# Patient Record
Sex: Female | Born: 1955 | Race: White | Hispanic: No | Marital: Married | Smoking: Never smoker
Health system: Southern US, Community
[De-identification: ages and names within clinical notes are randomized; demographics above are authoritative.]

## PROBLEM LIST (undated history)

## (undated) DIAGNOSIS — F329 Major depressive disorder, single episode, unspecified: Secondary | ICD-10-CM

## (undated) DIAGNOSIS — C801 Malignant (primary) neoplasm, unspecified: Secondary | ICD-10-CM

## (undated) DIAGNOSIS — Z923 Personal history of irradiation: Secondary | ICD-10-CM

## (undated) DIAGNOSIS — F419 Anxiety disorder, unspecified: Secondary | ICD-10-CM

## (undated) DIAGNOSIS — J189 Pneumonia, unspecified organism: Secondary | ICD-10-CM

## (undated) DIAGNOSIS — J019 Acute sinusitis, unspecified: Secondary | ICD-10-CM

## (undated) DIAGNOSIS — Z8042 Family history of malignant neoplasm of prostate: Secondary | ICD-10-CM

## (undated) DIAGNOSIS — I10 Essential (primary) hypertension: Secondary | ICD-10-CM

## (undated) DIAGNOSIS — F32A Depression, unspecified: Secondary | ICD-10-CM

## (undated) DIAGNOSIS — E669 Obesity, unspecified: Secondary | ICD-10-CM

## (undated) HISTORY — DX: Obesity, unspecified: E66.9

## (undated) HISTORY — DX: Essential (primary) hypertension: I10

## (undated) HISTORY — DX: Acute sinusitis, unspecified: J01.90

## (undated) HISTORY — DX: Family history of malignant neoplasm of prostate: Z80.42

## (undated) HISTORY — DX: Major depressive disorder, single episode, unspecified: F32.9

## (undated) HISTORY — PX: WISDOM TOOTH EXTRACTION: SHX21

## (undated) HISTORY — DX: Depression, unspecified: F32.A

## (undated) HISTORY — DX: Pneumonia, unspecified organism: J18.9

---

## 2000-07-28 ENCOUNTER — Other Ambulatory Visit: Admission: RE | Admit: 2000-07-28 | Discharge: 2000-07-28 | Payer: Self-pay | Admitting: Internal Medicine

## 2004-03-15 ENCOUNTER — Ambulatory Visit: Payer: Self-pay | Admitting: Internal Medicine

## 2004-07-30 ENCOUNTER — Ambulatory Visit: Payer: Self-pay | Admitting: Internal Medicine

## 2004-07-30 ENCOUNTER — Other Ambulatory Visit: Admission: RE | Admit: 2004-07-30 | Discharge: 2004-07-30 | Payer: Self-pay | Admitting: Internal Medicine

## 2004-08-04 ENCOUNTER — Ambulatory Visit: Payer: Self-pay | Admitting: Cardiology

## 2005-12-11 IMAGING — CT CT HEAD W/O CM
1 of 2 series · 16 of 30 positions shown, 20 images · non-contrast
Comparison: none

HISTORY: Tinnitus, ringing in ears, question acoustic neuroma

[Series 2: head_seq 4.5 h40s st · axial · 0.43mm/px · z∈[-129,-21]mm · 16 of 28 slices shown, 20 images]
[im 2/28  brain]
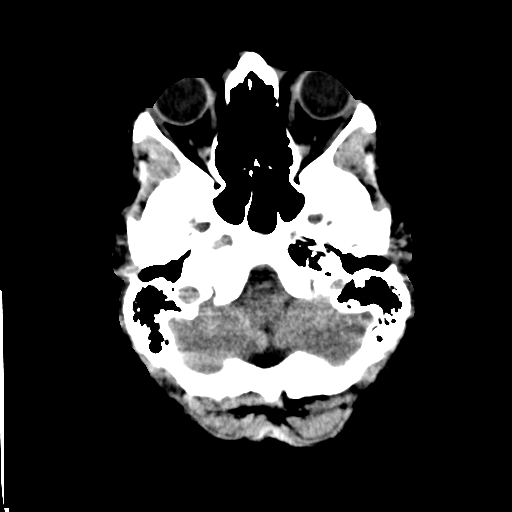
[im 2/28  bone]
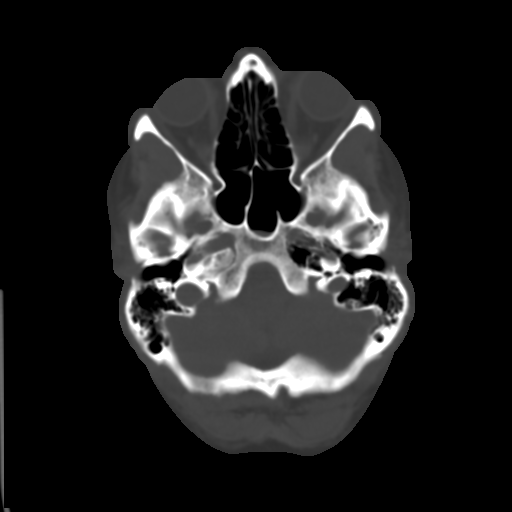
[im 4/28  brain]
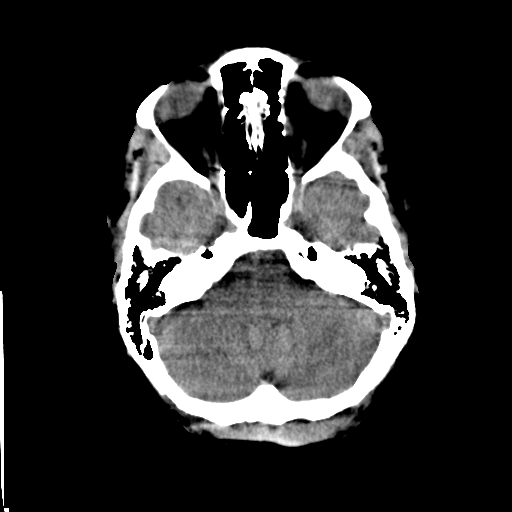
[im 5/28  brain]
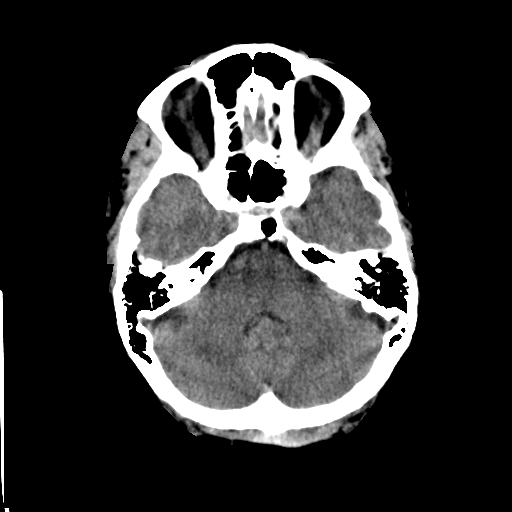
[im 7/28  brain]
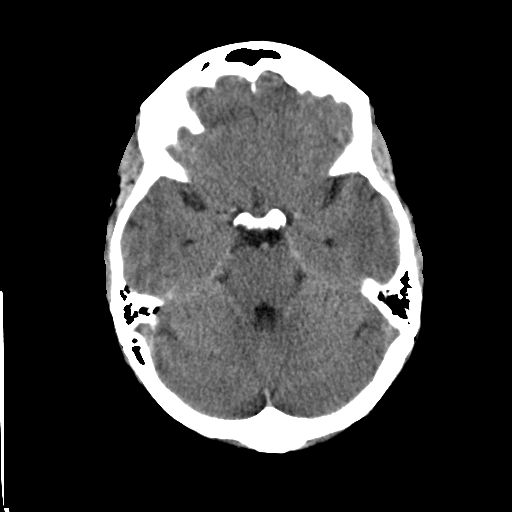
[im 8/28  brain]
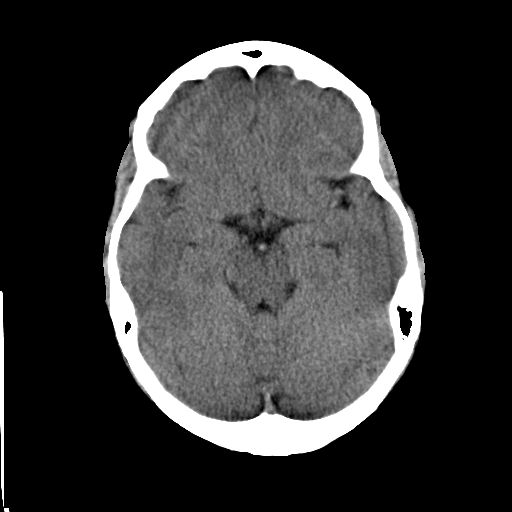
[im 8/28  bone]
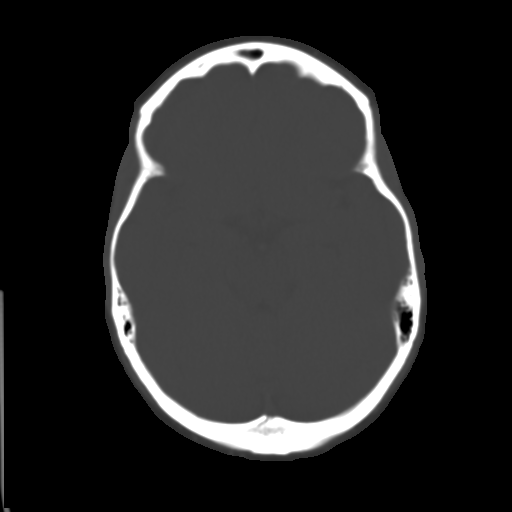
[im 10/28  brain]
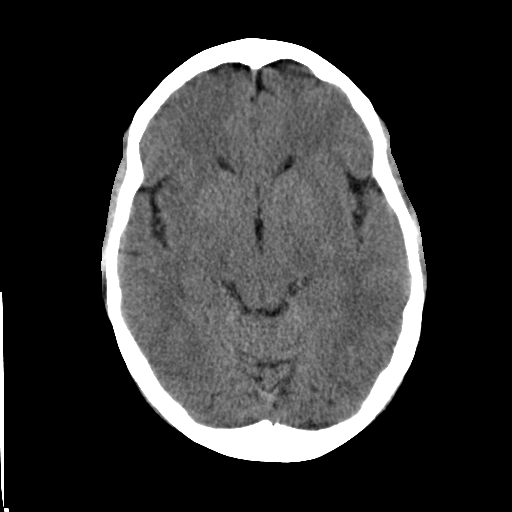
[im 11/28  brain]
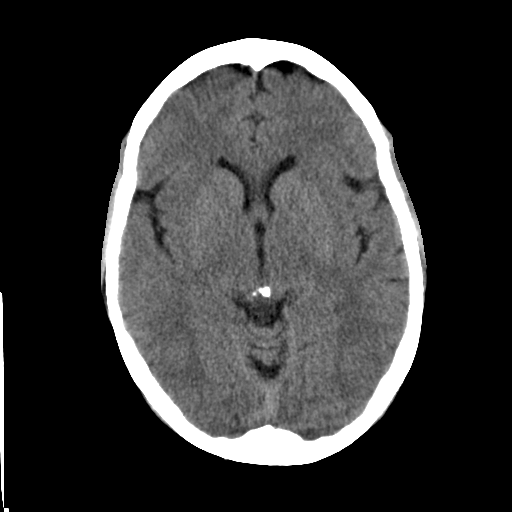
[im 13/28  brain]
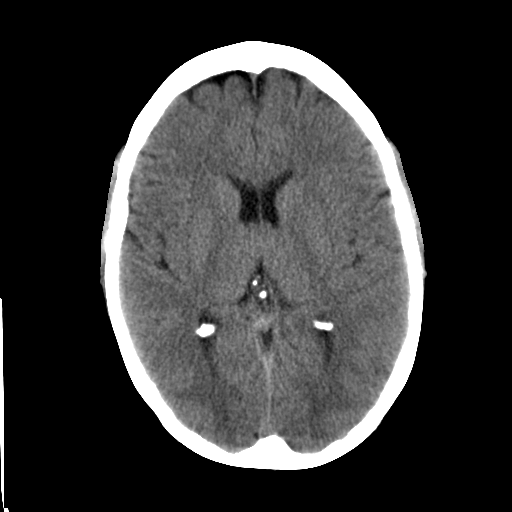
[im 15/28  brain]
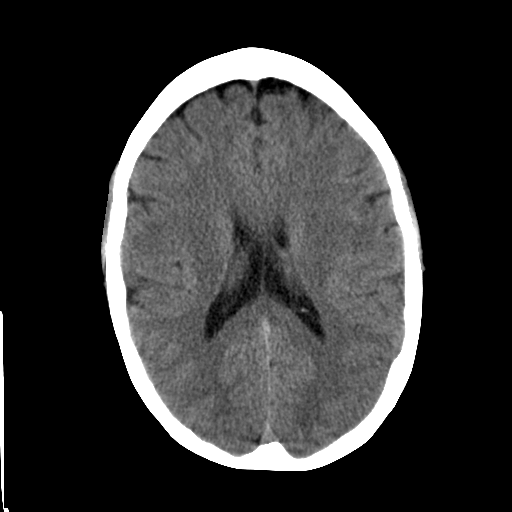
[im 15/28  bone]
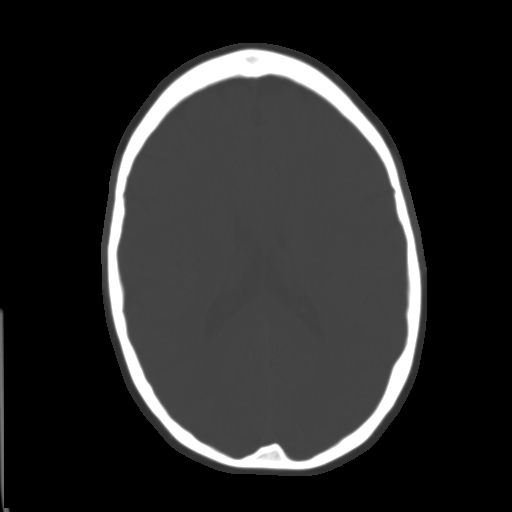
[im 17/28  brain]
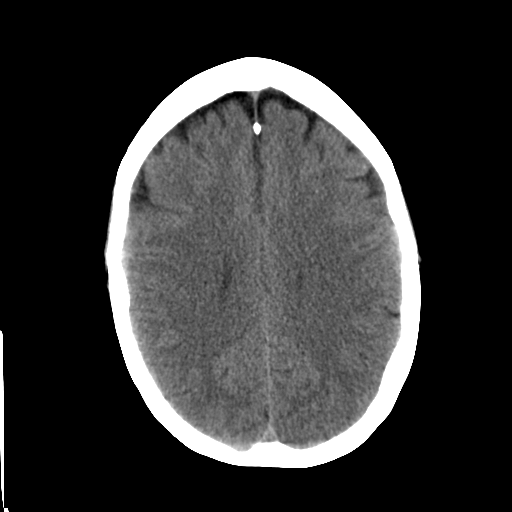
[im 18/28  brain]
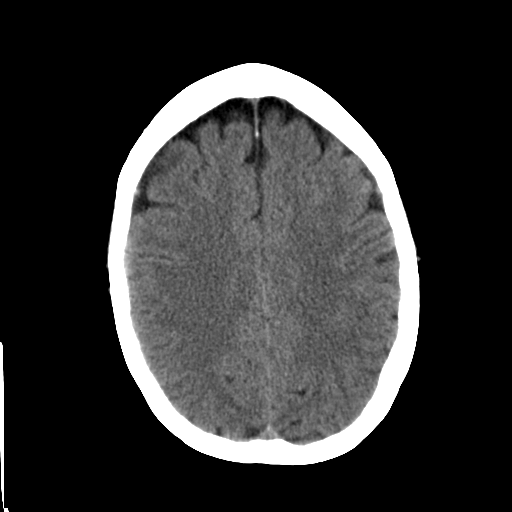
[im 20/28  brain]
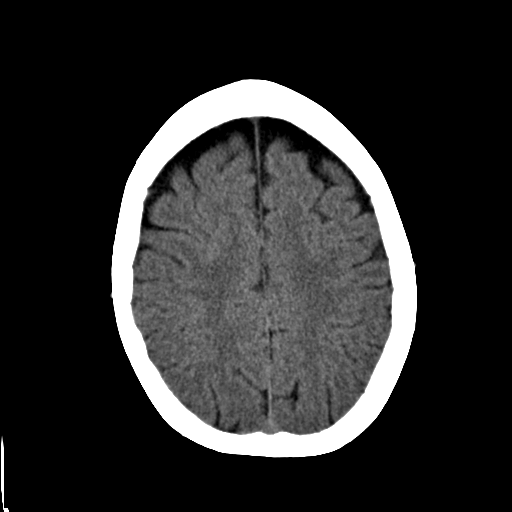
[im 21/28  brain]
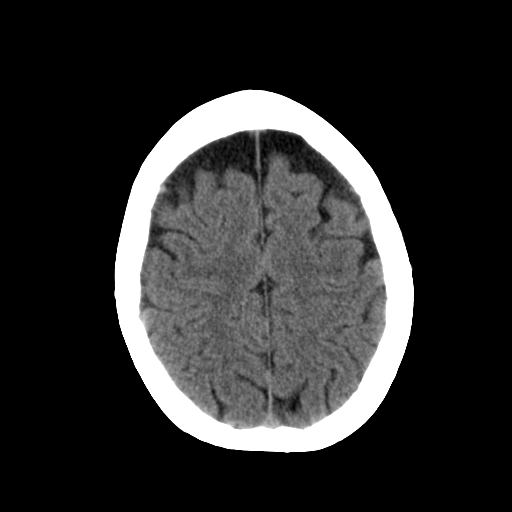
[im 21/28  bone]
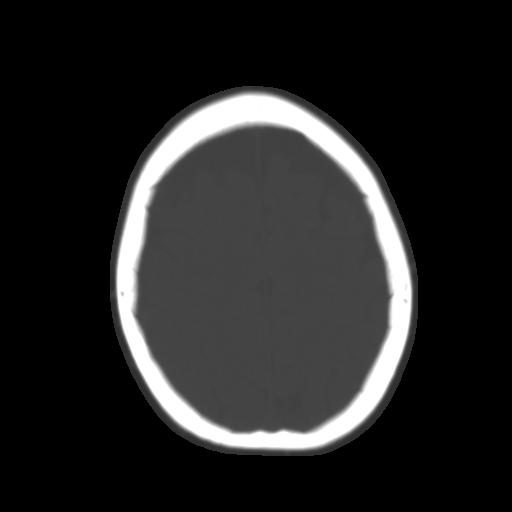
[im 23/28  brain]
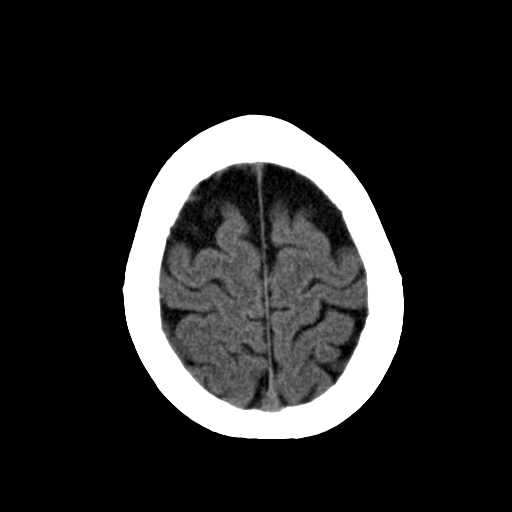
[im 24/28  brain]
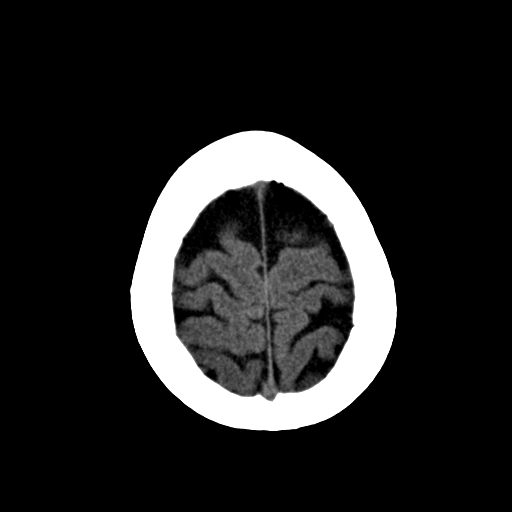
[im 26/28  brain]
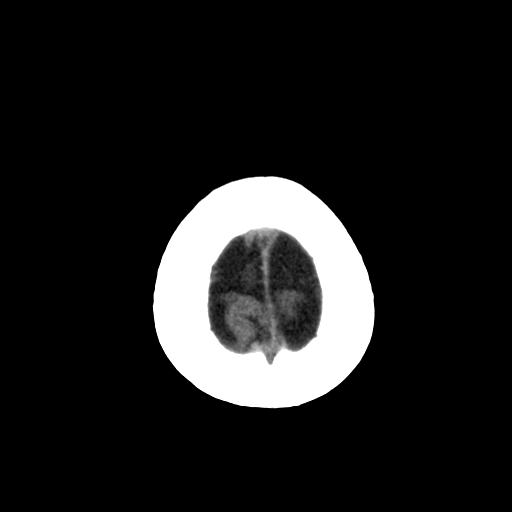

[16 of 30 positions shown; findings below may reference images not displayed]

CT HEAD WITHOUT CONTRAST:

Routine noncontrast CT without priors for comparison.

Scattered beam hardening artifacts at skullbase.
Normal ventricular morphology.
No midline shift or mass-effect.
Normal appearance of brain parenchyma.
No mass, hemorrhage, or infarct.
No definite cerebellopontine angle mass seen although visualization of this area
is limited by beam hardening artifacts.
Remainder of posterior fossa normal.
Sinuses clear and bones unremarkable.
IMPRESSION: No acute intracranial abnormalities.
Better detection of acoustic neuroma may be achieved with MRI of the IACs with
and without contrast.

## 2006-03-07 ENCOUNTER — Ambulatory Visit: Payer: Self-pay | Admitting: Internal Medicine

## 2007-11-09 ENCOUNTER — Telehealth (INDEPENDENT_AMBULATORY_CARE_PROVIDER_SITE_OTHER): Payer: Self-pay | Admitting: *Deleted

## 2007-11-10 ENCOUNTER — Ambulatory Visit: Payer: Self-pay | Admitting: Family Medicine

## 2008-06-10 ENCOUNTER — Telehealth (INDEPENDENT_AMBULATORY_CARE_PROVIDER_SITE_OTHER): Payer: Self-pay | Admitting: *Deleted

## 2008-07-25 ENCOUNTER — Telehealth: Payer: Self-pay | Admitting: Internal Medicine

## 2008-08-12 ENCOUNTER — Telehealth: Payer: Self-pay | Admitting: Internal Medicine

## 2008-12-29 ENCOUNTER — Ambulatory Visit: Payer: Self-pay | Admitting: Internal Medicine

## 2008-12-29 LAB — CONVERTED CEMR LAB
ALT: 22 units/L (ref 0–35)
AST: 21 units/L (ref 0–37)
Alkaline Phosphatase: 46 units/L (ref 39–117)
Basophils Absolute: 0.1 10*3/uL (ref 0.0–0.1)
CO2: 28 meq/L (ref 19–32)
Chloride: 103 meq/L (ref 96–112)
Direct LDL: 144.3 mg/dL
Glucose, Bld: 100 mg/dL — ABNORMAL HIGH (ref 70–99)
HCT: 40.4 % (ref 36.0–46.0)
Ketones, ur: NEGATIVE mg/dL
Leukocytes, UA: NEGATIVE
Lymphocytes Relative: 26.4 % (ref 12.0–46.0)
MCV: 92.2 fL (ref 78.0–100.0)
Monocytes Absolute: 0.6 10*3/uL (ref 0.1–1.0)
Monocytes Relative: 5.4 % (ref 3.0–12.0)
Neutrophils Relative %: 64.9 % (ref 43.0–77.0)
Nitrite: NEGATIVE
RBC: 4.39 M/uL (ref 3.87–5.11)
TSH: 1.81 microintl units/mL (ref 0.35–5.50)
Total CHOL/HDL Ratio: 4
Total Protein: 7.5 g/dL (ref 6.0–8.3)
Urobilinogen, UA: 0.2 (ref 0.0–1.0)
VLDL: 19 mg/dL (ref 0.0–40.0)
WBC: 10.7 10*3/uL — ABNORMAL HIGH (ref 4.5–10.5)

## 2009-01-05 ENCOUNTER — Other Ambulatory Visit: Admission: RE | Admit: 2009-01-05 | Discharge: 2009-01-05 | Payer: Self-pay | Admitting: Internal Medicine

## 2009-01-05 ENCOUNTER — Ambulatory Visit: Payer: Self-pay | Admitting: Internal Medicine

## 2009-01-05 LAB — HM PAP SMEAR

## 2009-01-08 DIAGNOSIS — J019 Acute sinusitis, unspecified: Secondary | ICD-10-CM | POA: Insufficient documentation

## 2009-01-08 DIAGNOSIS — I10 Essential (primary) hypertension: Secondary | ICD-10-CM | POA: Insufficient documentation

## 2009-01-08 DIAGNOSIS — E669 Obesity, unspecified: Secondary | ICD-10-CM

## 2009-01-08 DIAGNOSIS — F320 Major depressive disorder, single episode, mild: Secondary | ICD-10-CM

## 2009-01-08 DIAGNOSIS — J189 Pneumonia, unspecified organism: Secondary | ICD-10-CM | POA: Insufficient documentation

## 2009-01-22 ENCOUNTER — Encounter: Payer: Self-pay | Admitting: Internal Medicine

## 2009-04-08 ENCOUNTER — Encounter (INDEPENDENT_AMBULATORY_CARE_PROVIDER_SITE_OTHER): Payer: Self-pay | Admitting: *Deleted

## 2010-02-28 ENCOUNTER — Encounter: Payer: Self-pay | Admitting: Internal Medicine

## 2010-03-09 NOTE — Letter (Signed)
Summary: Referral - not able to see patient  [facility]  [address] [place], [place] [address]   Phone: [phone]  Fax: [phone]    April 08, 2009   Dr. Illene Regulus, M.D. [address] [place], [place] [address]    Re:   Shelby Payne DOB:  [birth year] MRN:   [id number]    Dear Dr. Debby Bud:  Thank you for your kind referral of the above patient.  We have attempted to schedule the recommended procedure Screening Colonoscopy but have not been able to schedule because:   X  The patient was not available by phone and/or has not returned our calls.  ___ The patient declined to schedule the procedure at this time.  We appreciate the referral and hope that we will have the opportunity to treat this patient in the future.    Sincerely,    [facility] [phone]

## 2010-06-17 ENCOUNTER — Other Ambulatory Visit: Payer: Self-pay | Admitting: Internal Medicine

## 2010-07-16 ENCOUNTER — Other Ambulatory Visit: Payer: Self-pay | Admitting: Internal Medicine

## 2010-11-16 ENCOUNTER — Other Ambulatory Visit: Payer: Self-pay | Admitting: Internal Medicine

## 2010-12-13 ENCOUNTER — Other Ambulatory Visit: Payer: Self-pay | Admitting: Internal Medicine

## 2011-01-10 ENCOUNTER — Other Ambulatory Visit: Payer: Self-pay | Admitting: *Deleted

## 2011-01-10 ENCOUNTER — Other Ambulatory Visit: Payer: Self-pay | Admitting: Internal Medicine

## 2011-01-10 MED ORDER — SERTRALINE HCL 100 MG PO TABS: 100.0000 mg | ORAL_TABLET | Freq: Every day | ORAL | Status: DC

## 2011-01-12 ENCOUNTER — Other Ambulatory Visit: Payer: Self-pay | Admitting: Internal Medicine

## 2011-01-25 ENCOUNTER — Encounter: Payer: Self-pay | Admitting: Internal Medicine

## 2011-01-25 ENCOUNTER — Ambulatory Visit (INDEPENDENT_AMBULATORY_CARE_PROVIDER_SITE_OTHER): Payer: BC Managed Care – PPO | Admitting: Internal Medicine

## 2011-01-25 VITALS — BP 160/90 | HR 82 | Temp 99.9°F | Wt 233.0 lb

## 2011-01-25 DIAGNOSIS — E669 Obesity, unspecified: Secondary | ICD-10-CM

## 2011-01-25 DIAGNOSIS — E785 Hyperlipidemia, unspecified: Secondary | ICD-10-CM

## 2011-01-25 DIAGNOSIS — Z8659 Personal history of other mental and behavioral disorders: Secondary | ICD-10-CM

## 2011-01-25 DIAGNOSIS — Z0000 Encounter for general adult medical examination without abnormal findings: Secondary | ICD-10-CM

## 2011-01-25 DIAGNOSIS — I10 Essential (primary) hypertension: Secondary | ICD-10-CM

## 2011-01-25 DIAGNOSIS — Z1211 Encounter for screening for malignant neoplasm of colon: Secondary | ICD-10-CM

## 2011-01-25 MED ORDER — ENALAPRIL MALEATE 2.5 MG PO TABS: 5.0000 mg | ORAL_TABLET | Freq: Every day | ORAL | Status: DC

## 2011-01-25 MED ORDER — SERTRALINE HCL 100 MG PO TABS: 100.0000 mg | ORAL_TABLET | Freq: Every day | ORAL | Status: DC

## 2011-01-25 MED ORDER — METOPROLOL SUCCINATE ER 100 MG PO TB24: 100.0000 mg | ORAL_TABLET | Freq: Every day | ORAL | Status: DC

## 2011-01-25 MED ORDER — HYDROCHLOROTHIAZIDE 25 MG PO TABS: 25.0000 mg | ORAL_TABLET | Freq: Every day | ORAL | Status: DC

## 2011-01-25 NOTE — Patient Instructions (Signed)
Blood pressure is running a little high. No signs of damage. Plan - continue metoprolol and HCTZ, add enalapril 5 mg once day, keep a record at home and report back, come by for lab in one month.  See you in the Spring of '13 for a complete exam with PAP.  Please schedule a mammogram at [facility].  Call for problems.

## 2011-01-25 NOTE — Progress Notes (Signed)
Subjective:    Patient ID: Shelby Payne, female    DOB: [birth year], 55 y.o.   MRN: [id number]  HPI Mrs. Chern presents for follow-up and treatment of hypertenion. Last visit in '10. Interval history is unremarkable for major illness, no surgery, no injuries. She is due for PAP in 2013, overdue for mammogram - never had, never had colonoscopy and is due. She is generally feeling well and doing well.  Past Medical History  Diagnosis Date  . Obesity, unspecified   . Depression   . Hypertension   . Acute sinusitis, unspecified   . Pneumonia, organism unspecified    Past Surgical History  Procedure Date  . Wisdom tooth extraction    Family History  Problem Relation Age of Onset  . Hypertension Mother   . Hypothyroidism Mother   . Hyperlipidemia Mother   . Gout Mother   . Heart disease Father   . Hypertension Father   . Diabetes Father   . Hypothyroidism Father   . Hypothyroidism Sister   . COPD Neg Hx   . Cancer Sister     angiosarcoma - mets   History   Social History  . Marital Status: Single    Spouse Name: N/A    Number of Children: N/A  . Years of Education: N/A   Occupational History  . Not on file.   Social History Main Topics  . Smoking status: Never Smoker   . Smokeless tobacco: Never Used  . Alcohol Use: Yes     frare glass of wine  . Drug Use: No  . Sexually Active: Yes -- Female partner(s)   Other Topics Concern  . Not on file   Social History Narrative   HSG, [employer], Master's degree in speech pathology, Master physiology. Married [id number] son 42; 1 daughter 44 - at [facility] (12/12). SO - working Pensions consultant, Suffers from [employer] disease. Marriage is in good  health   Current Outpatient Prescriptions on File Prior to Visit  Medication Sig Dispense Refill  . sertraline (ZOLOFT) 100 MG tablet take 1 tablet by mouth once daily NEEDS OFFICE VISIT  30 tablet  0       Review of Systems Constitutional:  Negative for fever, chills, activity  change and unexpected weight change.  HEENT:  Negative for hearing loss, ear pain, congestion, neck stiffness and postnasal drip. Negative for sore throat or swallowing problems. Negative for dental complaints.   Eyes: Negative for vision loss or change in visual acuity.  Respiratory: Negative for chest tightness and wheezing. Negative for DOE.   Cardiovascular: Negative for chest pain or palpitations. No decreased exercise tolerance Gastrointestinal: No change in bowel habit. No bloating or gas. No reflux or indigestion Genitourinary: Negative for urgency, frequency, flank pain and difficulty urinating.  Musculoskeletal: Negative for myalgias, back pain, arthralgias and gait problem.  Neurological: Negative for dizziness, tremors, weakness and headaches.  Hematological: Negative for adenopathy.  Psychiatric/Behavioral: Negative for behavioral problems and dysphoric mood.       Objective:   Physical Exam Vitals reviewed- 160/90, BMI 39.9 Gen'l- overweight white woman in no distress HEENT - C&S clear, TMs normal  Neck- supple, no thyromegaly Breast exam - deferred Pulm- normal respirations Cor - 2+ radial, RRR Neuro - A&O x 3  Lab Results  Component Value Date   WBC 10.7* 12/29/2008   HGB 13.8 12/29/2008   HCT 40.4 12/29/2008   PLT 225.0 12/29/2008   GLUCOSE 100* 12/29/2008   CHOL 204* 12/29/2008   TRIG  95.0 12/29/2008   HDL 51.80 12/29/2008   LDLDIRECT 144.3 12/29/2008   ALT 22 12/29/2008   AST 21 12/29/2008   NA 138 12/29/2008   K 3.9 12/29/2008   CL 103 12/29/2008   CREATININE 0.7 12/29/2008   BUN 12 12/29/2008   CO2 28 12/29/2008   TSH 1.81 12/29/2008        Assessment & Plan:

## 2011-01-27 ENCOUNTER — Encounter: Payer: Self-pay | Admitting: Internal Medicine

## 2011-01-27 DIAGNOSIS — Z0000 Encounter for general adult medical examination without abnormal findings: Secondary | ICD-10-CM | POA: Insufficient documentation

## 2011-01-27 DIAGNOSIS — E785 Hyperlipidemia, unspecified: Secondary | ICD-10-CM | POA: Insufficient documentation

## 2011-01-27 NOTE — Assessment & Plan Note (Signed)
BP Readings from Last 3 Encounters:  01/25/11 160/90  01/05/09 162/96  11/10/07 132/80   Trend is upward and control is suboptimal  Plan continue HCTZ and metoprolol         Add Enalapril 5 mg daily         Follow-up lab in 1 month         Recheck BP in one month

## 2011-01-27 NOTE — Assessment & Plan Note (Signed)
Stable and doing well on zoloft w/o adverse effects.  Plan - medication renewed.

## 2011-01-27 NOTE — Assessment & Plan Note (Signed)
Encourage weight management.  Plan - smart food choices, PORTION SIZE CONTROL - palm of the hand as guide, regular exercise.

## 2011-01-27 NOTE — Assessment & Plan Note (Signed)
Last lipid panel in '10 - mildly elevated LDL - not at the point where medical therapy is needed.  Plan - life style management - low fat diet           Repeat lab in 1 month

## 2011-01-27 NOTE — Assessment & Plan Note (Signed)
Interval history is ok. She is due for complete physical w/ PAP, last exam '10. She is due for mammography and she will schedule her own appointment at [facility]. She is due for colonoscopy and request is in for study in July '13

## 2011-03-11 ENCOUNTER — Other Ambulatory Visit (INDEPENDENT_AMBULATORY_CARE_PROVIDER_SITE_OTHER): Payer: BC Managed Care – PPO

## 2011-03-11 DIAGNOSIS — I10 Essential (primary) hypertension: Secondary | ICD-10-CM

## 2011-03-11 DIAGNOSIS — E785 Hyperlipidemia, unspecified: Secondary | ICD-10-CM

## 2011-03-11 LAB — COMPREHENSIVE METABOLIC PANEL
Alkaline Phosphatase: 44 U/L (ref 39–117)
BUN: 15 mg/dL (ref 6–23)
Creatinine, Ser: 0.7 mg/dL (ref 0.4–1.2)
GFR: 94.07 mL/min (ref >60.00)
Glucose, Bld: 87 mg/dL (ref 70–99)
Sodium: 135 mEq/L (ref 135–145)
Total Bilirubin: 0.5 mg/dL (ref 0.3–1.2)
Total Protein: 8 g/dL (ref 6.0–8.3)

## 2011-03-11 LAB — LIPID PANEL
Cholesterol: 190 mg/dL (ref 0–200)
HDL: 51.1 mg/dL (ref >39.00)
LDL Cholesterol: 118 mg/dL — ABNORMAL HIGH (ref 0–99)
Triglycerides: 107 mg/dL (ref 0.0–149.0)
VLDL: 21.4 mg/dL (ref 0.0–40.0)

## 2011-03-20 ENCOUNTER — Encounter: Payer: Self-pay | Admitting: Internal Medicine

## 2011-03-25 ENCOUNTER — Other Ambulatory Visit: Payer: Self-pay | Admitting: Internal Medicine

## 2011-04-12 ENCOUNTER — Encounter: Payer: Self-pay | Admitting: Internal Medicine

## 2011-04-12 ENCOUNTER — Ambulatory Visit (INDEPENDENT_AMBULATORY_CARE_PROVIDER_SITE_OTHER): Payer: BC Managed Care – PPO | Admitting: Internal Medicine

## 2011-04-12 VITALS — BP 130/80 | HR 84 | Temp 98.6°F | Resp 16 | Wt 231.5 lb

## 2011-04-12 DIAGNOSIS — R059 Cough, unspecified: Secondary | ICD-10-CM

## 2011-04-12 DIAGNOSIS — R05 Cough: Secondary | ICD-10-CM

## 2011-04-12 MED ORDER — BENZONATATE 100 MG PO CAPS: 100.0000 mg | ORAL_CAPSULE | Freq: Three times a day (TID) | ORAL | Status: AC

## 2011-04-12 MED ORDER — AZITHROMYCIN 250 MG PO TABS: ORAL_TABLET | ORAL | Status: AC

## 2011-04-12 MED ORDER — PROMETHAZINE-CODEINE 6.25-10 MG/5ML PO SYRP: 5.0000 mL | ORAL_SOLUTION | ORAL | Status: AC | PRN

## 2011-04-12 NOTE — Patient Instructions (Signed)
Persistent cough - viral infection with cough, no indication of a bacterial infection. I am concerned for the possibility of adult pertussis, unlikely but possible. Plan - codeine cough syrup 1 tsp every 4-5 hrs during the day; 2 tsp at bedtime           Tessalon perles 100 mg three times a day           Azithromycin as directed- an antibiotic that is specifically the treatment for pertussis.  Blood pressure - GREAT control.   Cough, Adult  A cough is a reflex that helps clear your throat and airways. It can help heal the body or may be a reaction to an irritated airway. A cough may only last 2 or 3 weeks (acute) or may last more than 8 weeks (chronic).   CAUSES Acute cough:  Viral or bacterial infections.  Chronic cough:  Infections.   Allergies.   Asthma.   Post-nasal drip.   Smoking.   Heartburn or acid reflux.   Some medicines.   Chronic lung problems (COPD).   Cancer.  SYMPTOMS    Cough.   Fever.   Chest pain.   Increased breathing rate.   High-pitched whistling sound when breathing (wheezing).   Colored mucus that you cough up (sputum).  TREATMENT    A bacterial cough may be treated with antibiotic medicine.   A viral cough must run its course and will not respond to antibiotics.   Your caregiver may recommend other treatments if you have a chronic cough.  HOME CARE INSTRUCTIONS    Only take over-the-counter or prescription medicines for pain, discomfort, or fever as directed by your caregiver. Use cough suppressants only as directed by your caregiver.   Use a cold steam vaporizer or humidifier in your bedroom or home to help loosen secretions.   Sleep in a semi-upright position if your cough is worse at night.   Rest as needed.   Stop smoking if you smoke.  SEEK IMMEDIATE MEDICAL CARE IF:    You have pus in your sputum.   Your cough starts to worsen.   You cannot control your cough with suppressants and are losing sleep.   You begin  coughing up blood.   You have difficulty breathing.   You develop pain which is getting worse or is uncontrolled with medicine.   You have a fever.  MAKE SURE YOU:    Understand these instructions.   Will watch your condition.   Will get help right away if you are not doing well or get worse.  Document Released: 07/23/2010 Document Revised: 01/13/2011 Document Reviewed: 07/23/2010 [facility] Patient Information 2012 [place], [place].

## 2011-04-13 NOTE — Progress Notes (Signed)
  Subjective:    Patient ID: Shelby Payne, female    DOB: August 24, 1955, 56 y.o.   MRN: [id number]  HPI Shelby Payne presents with a "barky" cough for approximately 1 week. She hurts in her throat and chest from coughing. She denies any sputum production, fever, chills, SOB. With paroxysms of coughing she will gag but otherwise no N/V. She does have contact with children.  PMH, FamHx and SocHx reviewed for any changes and relevance.    Review of Systems System review is negative for any constitutional, cardiac, pulmonary, GI or neuro symptoms or complaints other than as described in the HPI.     Objective:   Physical Exam Filed Vitals:   04/12/11 1141  BP: 130/80  Pulse: 84  Temp: 98.6 F (37 C)  Resp: 16   Weight: 231 lb 8 oz (105.008 kg)  Gen'l- overweight white woman in no distress HEENT- TMs normal, throat clear Neck - supple Nodes - negative PUlm - good breath sounds, no rales or wheeze, no increased WOB Cor - RRR       Assessment & Plan:  Cough - viral URI vs possible pertussis  Plan - z-pak; prom/cod 1 tsp q 6; tessalon perles; supportive care.

## 2011-04-22 ENCOUNTER — Other Ambulatory Visit: Payer: Self-pay | Admitting: Internal Medicine

## 2011-05-29 ENCOUNTER — Other Ambulatory Visit: Payer: Self-pay | Admitting: Internal Medicine

## 2011-08-18 ENCOUNTER — Telehealth: Payer: Self-pay | Admitting: Internal Medicine

## 2011-08-18 DIAGNOSIS — Z1211 Encounter for screening for malignant neoplasm of colon: Secondary | ICD-10-CM

## 2011-08-18 NOTE — Telephone Encounter (Signed)
Referred patient for colonoscopy as discussed.

## 2011-08-19 ENCOUNTER — Encounter: Payer: Self-pay | Admitting: Internal Medicine

## 2011-08-22 ENCOUNTER — Ambulatory Visit (AMBULATORY_SURGERY_CENTER): Payer: BC Managed Care – PPO | Admitting: *Deleted

## 2011-08-22 ENCOUNTER — Encounter: Payer: Self-pay | Admitting: Internal Medicine

## 2011-08-22 VITALS — Ht 64.0 in | Wt 228.6 lb

## 2011-08-22 DIAGNOSIS — Z1211 Encounter for screening for malignant neoplasm of colon: Secondary | ICD-10-CM

## 2011-08-22 MED ORDER — MOVIPREP 100 G PO SOLR: 1.0000 | Freq: Once | ORAL | Status: DC

## 2011-08-31 ENCOUNTER — Encounter: Payer: Self-pay | Admitting: Internal Medicine

## 2011-08-31 ENCOUNTER — Ambulatory Visit (AMBULATORY_SURGERY_CENTER): Payer: BC Managed Care – PPO | Admitting: Internal Medicine

## 2011-08-31 VITALS — BP 147/95 | HR 76 | Temp 97.8°F | Resp 15 | Ht 64.0 in | Wt 228.0 lb

## 2011-08-31 DIAGNOSIS — Z1211 Encounter for screening for malignant neoplasm of colon: Secondary | ICD-10-CM

## 2011-08-31 DIAGNOSIS — K648 Other hemorrhoids: Secondary | ICD-10-CM

## 2011-08-31 MED ORDER — SODIUM CHLORIDE 0.9 % IV SOLN: 500.0000 mL | INTRAVENOUS | Status: DC

## 2011-08-31 NOTE — Patient Instructions (Addendum)
The only finding on your colonoscopy was internal hemorrhoids. These are not usually a medical problem. No polyps or cancer seen! Excellent prep!  Please read the handouts and I will see you in 10 years for another routine colonoscopy if that is still the recommendation in 2023.  Thank you for choosing [facility].  Iva Boop, MD, FACG  YOU HAD AN ENDOSCOPIC PROCEDURE TODAY AT THE [facility]: Refer to the procedure report that was given to you for any specific questions about what was found during the examination.  If the procedure report does not answer your questions, please call your gastroenterologist to clarify.  If you requested that your care partner not be given the details of your procedure findings, then the procedure report has been included in a sealed envelope for you to review at your convenience later.  YOU SHOULD EXPECT: Some feelings of bloating in the abdomen. Passage of more gas than usual.  Walking can help get rid of the air that was put into your GI tract during the procedure and reduce the bloating. If you had a lower endoscopy (such as a colonoscopy or flexible sigmoidoscopy) you may notice spotting of blood in your stool or on the toilet paper. If you underwent a bowel prep for your procedure, then you may not have a normal bowel movement for a few days.  DIET: Your first meal following the procedure should be a light meal and then it is ok to progress to your normal diet.  A half-sandwich or bowl of soup is an example of a good first meal.  Heavy or fried foods are harder to digest and may make you feel nauseous or bloated.  Likewise meals heavy in dairy and vegetables can cause extra gas to form and this can also increase the bloating.  Drink plenty of fluids but you should avoid alcoholic beverages for 24 hours.  ACTIVITY: Your care partner should take you home directly after the procedure.  You should plan to take it easy, moving slowly  for the rest of the day.  You can resume normal activity the day after the procedure however you should NOT DRIVE or use heavy machinery for 24 hours (because of the sedation medicines used during the test).    SYMPTOMS TO REPORT IMMEDIATELY: A gastroenterologist can be reached at any hour.  During normal business hours, 8:30 AM to 5:00 PM Monday through Friday, call [phone].  After hours and on weekends, please call the GI answering service at [phone] who will take a message and have the physician on call contact you.   Following lower endoscopy (colonoscopy or flexible sigmoidoscopy):  Excessive amounts of blood in the stool  Significant tenderness or worsening of abdominal pains  Swelling of the abdomen that is new, acute  Fever of 100F or higher   FOLLOW UP: Our staff will call the home number listed on your records the next business day following your procedure to check on you and address any questions or concerns that you may have at that time regarding the information given to you following your procedure. This is a courtesy call and so if there is no answer at the home number and we have not heard from you through the emergency physician on call, we will assume that you have returned to your regular daily activities without incident.  SIGNATURES/CONFIDENTIALITY: You and/or your care partner have signed paperwork which will be entered into your electronic medical record.  These signatures  attest to the fact that that the information above on your After Visit Summary has been reviewed and is understood.  Full responsibility of the confidentiality of this discharge information lies with you and/or your care-partner.   Ok to resume your normal medications  Follow up colonoscopy in 10 years

## 2011-08-31 NOTE — Op Note (Signed)
[facility] [address] [place], [place]  [address]  COLONOSCOPY PROCEDURE REPORT  PATIENT:  Shelby Payne, Shelby Payne  MR#:  [id number] BIRTHDATE:  [birth year], 55 yrs. old  GENDER:  female ENDOSCOPIST:  Iva Boop, MD, [facility] REF. BY:  Rosalyn Gess. Norins, M.D. PROCEDURE DATE:  08/31/2011 PROCEDURE:  Colonoscopy 19147 ASA CLASS:  Class II INDICATIONS:  Routine Risk Screening MEDICATIONS:   These medications were titrated to patient response per physician's verbal order, MAC sedation, administered by CRNA, propofol (Diprivan) 200 mg IV  DESCRIPTION OF PROCEDURE:   After the risks benefits and alternatives of the procedure were thoroughly explained, informed consent was obtained.  Digital rectal exam was performed and revealed no abnormalities.   The LB [id number] endoscope was introduced through the anus and advanced to the cecum, which was identified by both the appendix and ileocecal valve, without limitations.  The quality of the prep was excellent, using MoviPrep.  The instrument was then slowly withdrawn as the colon was fully examined. <<PROCEDUREIMAGES>>  FINDINGS:  A normal appearing cecum, ileocecal valve, and appendiceal orifice were identified. The ascending, hepatic flexure, transverse, splenic flexure, descending, sigmoid colon, and rectum appeared unremarkable.   Retroflexed views in the rectum revealed internal hemorrhoids.    The time to cecum = 2:34 minutes. The scope was withdrawn in 9:18 minutes from the cecum and the procedure completed. COMPLICATIONS:  None ENDOSCOPIC IMPRESSION: 1) Normal colon 2) Internal hemorrhoids in the rectum  REPEAT EXAM:  In 10 year(s) for routine screening colonoscopy. 2023  Iva Boop, MD, [facility]  CC:  Jacques Navy, MD and The Patient  n. Rosalie Doctor:   Iva Boop at 08/31/2011 08:50 AM  Alveta Heimlich, [phone]

## 2011-08-31 NOTE — Progress Notes (Signed)
Patient did not have preoperative order for IV antibiotic SSI prophylaxis. (G8918)  Patient did not experience any of the following events: a burn prior to discharge; a fall within the facility; wrong site/side/patient/procedure/implant event; or a hospital transfer or hospital admission upon discharge from the facility. (G8907)  

## 2011-09-01 ENCOUNTER — Telehealth: Payer: Self-pay | Admitting: *Deleted

## 2011-09-01 NOTE — Telephone Encounter (Signed)
Line busy on several attempts

## 2011-10-10 ENCOUNTER — Other Ambulatory Visit: Payer: Self-pay | Admitting: Internal Medicine

## 2011-11-18 ENCOUNTER — Other Ambulatory Visit: Payer: Self-pay | Admitting: Internal Medicine

## 2012-02-15 ENCOUNTER — Other Ambulatory Visit: Payer: Self-pay | Admitting: *Deleted

## 2012-02-15 ENCOUNTER — Other Ambulatory Visit: Payer: Self-pay | Admitting: Internal Medicine

## 2012-02-15 MED ORDER — METOPROLOL SUCCINATE ER 100 MG PO TB24: 100.0000 mg | ORAL_TABLET | Freq: Every day | ORAL | Status: DC

## 2012-02-15 MED ORDER — HYDROCHLOROTHIAZIDE 25 MG PO TABS: 25.0000 mg | ORAL_TABLET | Freq: Every day | ORAL | Status: DC

## 2012-02-15 NOTE — Telephone Encounter (Signed)
REFILLS ON MEDS SENT TO [facility]. PATIENT NEED TO MAKE APPT. WITH DR. Debby Bud FOR FURTHER REFILLS.

## 2012-03-19 ENCOUNTER — Other Ambulatory Visit: Payer: Self-pay | Admitting: Internal Medicine

## 2012-04-03 ENCOUNTER — Other Ambulatory Visit: Payer: Self-pay | Admitting: *Deleted

## 2012-04-03 MED ORDER — METOPROLOL SUCCINATE ER 100 MG PO TB24: 100.0000 mg | ORAL_TABLET | Freq: Every day | ORAL | Status: DC

## 2012-04-04 ENCOUNTER — Other Ambulatory Visit: Payer: Self-pay | Admitting: Internal Medicine

## 2012-05-11 ENCOUNTER — Telehealth: Payer: Self-pay | Admitting: Internal Medicine

## 2012-05-11 MED ORDER — HYDROCHLOROTHIAZIDE 25 MG PO TABS: 25.0000 mg | ORAL_TABLET | Freq: Every day | ORAL | Status: DC

## 2012-05-11 MED ORDER — METOPROLOL SUCCINATE ER 100 MG PO TB24: 100.0000 mg | ORAL_TABLET | Freq: Every day | ORAL | Status: DC

## 2012-05-11 MED ORDER — ENALAPRIL MALEATE 5 MG PO TABS: 5.0000 mg | ORAL_TABLET | Freq: Every day | ORAL | Status: DC

## 2012-05-11 NOTE — Telephone Encounter (Signed)
Rx's electronically sent to pharmacy as requested

## 2012-05-11 NOTE — Telephone Encounter (Signed)
Pt called req refill for Hydrochlorothiazide, metoprolol and enalapril to be call into [facility]. Pt had to move her CPE appt from 05/14/12 to 07/31/12. Please advise.

## 2012-05-14 ENCOUNTER — Encounter: Payer: BC Managed Care – PPO | Admitting: Internal Medicine

## 2012-07-31 ENCOUNTER — Ambulatory Visit (INDEPENDENT_AMBULATORY_CARE_PROVIDER_SITE_OTHER): Payer: BC Managed Care – PPO | Admitting: Internal Medicine

## 2012-07-31 ENCOUNTER — Encounter: Payer: Self-pay | Admitting: Internal Medicine

## 2012-07-31 ENCOUNTER — Other Ambulatory Visit (HOSPITAL_COMMUNITY)
Admission: RE | Admit: 2012-07-31 | Discharge: 2012-07-31 | Disposition: A | Discharge status: Home / Self Care | Payer: BC Managed Care – PPO | Source: Ambulatory Visit | Attending: Internal Medicine | Admitting: Internal Medicine

## 2012-07-31 ENCOUNTER — Other Ambulatory Visit (INDEPENDENT_AMBULATORY_CARE_PROVIDER_SITE_OTHER): Payer: BC Managed Care – PPO

## 2012-07-31 VITALS — BP 148/90 | HR 84 | Temp 98.1°F | Wt 236.0 lb

## 2012-07-31 DIAGNOSIS — F4329 Adjustment disorder with other symptoms: Secondary | ICD-10-CM

## 2012-07-31 DIAGNOSIS — Z124 Encounter for screening for malignant neoplasm of cervix: Secondary | ICD-10-CM

## 2012-07-31 DIAGNOSIS — E669 Obesity, unspecified: Secondary | ICD-10-CM

## 2012-07-31 DIAGNOSIS — I10 Essential (primary) hypertension: Secondary | ICD-10-CM

## 2012-07-31 DIAGNOSIS — Z01419 Encounter for gynecological examination (general) (routine) without abnormal findings: Secondary | ICD-10-CM | POA: Insufficient documentation

## 2012-07-31 DIAGNOSIS — E785 Hyperlipidemia, unspecified: Secondary | ICD-10-CM

## 2012-07-31 DIAGNOSIS — F4381 Prolonged grief disorder: Secondary | ICD-10-CM

## 2012-07-31 DIAGNOSIS — Z0000 Encounter for general adult medical examination without abnormal findings: Secondary | ICD-10-CM

## 2012-07-31 DIAGNOSIS — R202 Paresthesia of skin: Secondary | ICD-10-CM

## 2012-07-31 DIAGNOSIS — R209 Unspecified disturbances of skin sensation: Secondary | ICD-10-CM

## 2012-07-31 DIAGNOSIS — F4321 Adjustment disorder with depressed mood: Secondary | ICD-10-CM

## 2012-07-31 LAB — COMPREHENSIVE METABOLIC PANEL
BUN: 15 mg/dL (ref 6–23)
CO2: 26 mEq/L (ref 19–32)
Creatinine, Ser: 0.8 mg/dL (ref 0.4–1.2)
GFR: 85 mL/min (ref >60.00)
Glucose, Bld: 106 mg/dL — ABNORMAL HIGH (ref 70–99)
Total Bilirubin: 0.7 mg/dL (ref 0.3–1.2)

## 2012-07-31 LAB — HEPATIC FUNCTION PANEL
AST: 24 U/L (ref 0–37)
Alkaline Phosphatase: 41 U/L (ref 39–117)
Total Bilirubin: 0.7 mg/dL (ref 0.3–1.2)

## 2012-07-31 LAB — VITAMIN B12: Vitamin B-12: 746 pg/mL (ref 211–911)

## 2012-07-31 NOTE — Patient Instructions (Addendum)
Good to see you. Your exam is normal but I do want you to work on your weight.  Labs today and results will be available on MyChart  Diet management: smart food choices, PORTION SIZE CONTROL, regular exercise. Goal - to loose 1-2 lbs.month. Target weight - 200 lbs  Be well and take care of yourself. Return in 1 year or sooner as needed.

## 2012-07-31 NOTE — Assessment & Plan Note (Signed)
Lipid panel 1 yr ago: LDL 118, better than goal of 130 or less; HDL 51, better than goal of 40+  Plan Life-style management: low fat diet, exercise.  Repeat lipid panel '15

## 2012-07-31 NOTE — Assessment & Plan Note (Signed)
Loss of son 2012, loss of sister to cancer May '14, mother in declining health. Emotions in regard to these losses easily stirred. She has never had grief counseling.  Plan Strongly urged her to consider grief counseling. Should she decide to take this advise will schedule her with [facility]

## 2012-07-31 NOTE — Assessment & Plan Note (Signed)
Body mass index is 40.49 kg/(m^2). On-going problem with obesity.  Plan -Diet management: smart food choices, PORTION SIZE CONTROL, regular exercise. Goal - to loose 1-2 lbs.month. Target weight - 200 lbs

## 2012-07-31 NOTE — Assessment & Plan Note (Signed)
BP Readings from Last 3 Encounters:  07/31/12 148/90  08/31/11 147/95  04/12/11 130/80   Borderline control.  Plan Continue present medication  Monitor BP at home and report back readings via MyChart  Continue weight management, especially exercise.

## 2012-07-31 NOTE — Assessment & Plan Note (Signed)
Interval medical history is benign. See grief and loss above. Physical exam with pelvic/PAP and breast exam normal except for weight. Lab results are ok. She is current with colorectal cancer screening and had normal pelvic exam. Will schedule for mammogram. Immunization - up to date.  In summary - a nice woman who appears to be medically stable at this time. She will return as needed or in 1 year, 12 lbs lighter.

## 2012-07-31 NOTE — Progress Notes (Signed)
Subjective:    Patient ID: Shelby Payne, female    DOB: [birth year], 56 y.o.   MRN: [id number]  HPI Shelby Payne presents for a routine wellness exam. Work stress - felt forced out of her job. Her mom also had a serious illness - February/march. Younger sister died from angio-sarcoma after a 4 year struggle in the last month. In regard to her physical health she is doing OK. She does have plantar paresthesia with standing.   Last PAP 2010. Never has had a mammogram.   Past Medical History  Diagnosis Date  . Obesity, unspecified   . Depression   . Hypertension   . Acute sinusitis, unspecified   . Pneumonia, organism unspecified    Past Surgical History  Procedure Laterality Date  . Wisdom tooth extraction     Family History  Problem Relation Age of Onset  . Hypertension Mother   . Hypothyroidism Mother   . Hyperlipidemia Mother   . Gout Mother   . Heart disease Father   . Hypertension Father   . Diabetes Father   . Hypothyroidism Father   . Hypothyroidism Sister   . COPD Neg Hx   . Colon cancer Neg Hx   . Rectal cancer Neg Hx   . Stomach cancer Neg Hx   . Cancer Sister     angiosarcoma - mets  . Esophageal cancer Maternal Uncle    History   Social History  . Marital Status: Single    Spouse Name: N/A    Number of Children: N/A  . Years of Education: N/A   Occupational History  . Not on file.   Social History Main Topics  . Smoking status: Never Smoker   . Smokeless tobacco: Never Used  . Alcohol Use: Yes     Comment: rare glass of wine  . Drug Use: No  . Sexually Active: Yes -- Female partner(s)   Other Topics Concern  . Not on file   Social History Narrative   HSG, Shelby Payne, Master's degree in speech pathology, Master physiology. Married [id number] son [id number]; 1 daughter 1 - at [facility] (12/12). SO - working Pensions consultant, Suffers from [employer] disease. Marriage is in good  Health   Resigned from her job March 2014    Current Outpatient Prescriptions on File  Prior to Visit  Medication Sig Dispense Refill  . enalapril (VASOTEC) 5 MG tablet Take 1 tablet (5 mg total) by mouth daily.  30 tablet  2  . hydrochlorothiazide (HYDRODIURIL) 25 MG tablet Take 1 tablet (25 mg total) by mouth daily.  30 tablet  2  . metoprolol succinate (TOPROL-XL) 100 MG 24 hr tablet Take 1 tablet (100 mg total) by mouth daily.  30 tablet  2  . sertraline (ZOLOFT) 100 MG tablet take 1 tablet by mouth once daily  30 tablet  5   No current facility-administered medications on file prior to visit.      Review of Systems Constitutional:  Negative for fever, chills, activity change and unexpected weight change.  HEENT:  Negative for hearing loss, ear pain, congestion, neck stiffness and postnasal drip. Negative for sore throat or swallowing problems. Negative for dental complaints.   Eyes: Negative for vision loss or change in visual acuity.  Respiratory: Negative for chest tightness and wheezing. Negative for DOE.   Cardiovascular: Negative for chest pain or palpitations. No decreased exercise tolerance Gastrointestinal: No change in bowel habit. No bloating or gas. No reflux or indigestion Genitourinary: Negative for urgency, frequency,  flank pain and difficulty urinating.  Musculoskeletal: Negative for myalgias, back pain, arthralgias and gait problem.  Neurological: Negative for dizziness, tremors, weakness and headaches.  Hematological: Negative for adenopathy.  Psychiatric/Behavioral: Negative for behavioral problems and dysphoric mood.       Objective:   Physical Exam Filed Vitals:   07/31/12 1443  BP: 148/90  Pulse: 84  Temp: 98.1 F (36.7 C)   Wt Readings from Last 3 Encounters:  07/31/12 236 lb (107.049 kg)  08/31/11 228 lb (103.42 kg)  08/22/11 228 lb 9.6 oz (103.692 kg)   Gen'l: well nourished, well developed white Woman in no distress HEENT - [place]/AT, EACs/TMs normal, oropharynx with native dentition in good condition, no buccal or palatal lesions,  posterior pharynx clear, mucous membranes moist. C&S clear, PERRLA, fundi - normal Neck - supple, no thyromegaly Nodes- negative submental, cervical, supraclavicular regions Chest - no deformity, no CVAT Lungs - clear without rales, wheezes. No increased work of breathing Breast - - Skin normal, nipples w/o discharge, no fixed mass or lesion, no axillary adenopathy. Cardiovascular - regular rate and rhythm, quiet precordium, no murmurs, rubs or gallops, 2+ radial, DP and PT pulses Abdomen - BS+ x 4, no HSM, no guarding or rebound or tenderness Pelvic - NEG/BUS normal, normal vaginal mucosa, parous cervix, normal PAP scraping with endocervical brush, bimanual exam deferred to girth Rectal - deferred  Extremities - no clubbing, cyanosis, edema or deformity.  Neuro - A&O x 3, CN II-XII normal, motor strength normal and equal, DTRs 2+ and symmetrical biceps, radial, and patellar tendons. Cerebellar - no tremor, no rigidity, fluid movement and normal gait. Normal deep vibratory sensation right foot.  Derm - Head, neck, back, abdomen and extremities without suspicious lesions  Recent Results (from the past 2160 hour(s))  HEPATIC FUNCTION PANEL     Status: Abnormal   Collection Time    07/31/12  4:08 PM      Result Value Range   Total Bilirubin 0.7  0.3 - 1.2 mg/dL   Bilirubin, Direct 0.1  0.0 - 0.3 mg/dL   Alkaline Phosphatase 41  39 - 117 U/L   AST 24  0 - 37 U/L   ALT 27  0 - 35 U/L   Total Protein 8.4 (*) 6.0 - 8.3 g/dL   Albumin 4.4  3.5 - 5.2 g/dL  COMPREHENSIVE METABOLIC PANEL     Status: Abnormal   Collection Time    07/31/12  4:08 PM      Result Value Range   Sodium 136  135 - 145 mEq/L   Potassium 3.8  3.5 - 5.1 mEq/L   Chloride 97  96 - 112 mEq/L   CO2 26  19 - 32 mEq/L   Glucose, Bld 106 (*) 70 - 99 mg/dL   BUN 15  6 - 23 mg/dL   Creatinine, Ser 0.8  0.4 - 1.2 mg/dL   Total Bilirubin 0.7  0.3 - 1.2 mg/dL   Alkaline Phosphatase 41  39 - 117 U/L   AST 24  0 - 37 U/L   ALT  27  0 - 35 U/L   Total Protein 8.4 (*) 6.0 - 8.3 g/dL   Albumin 4.4  3.5 - 5.2 g/dL   Calcium 04.5  8.4 - 40.9 mg/dL   GFR 81.19  >14.78 mL/min  VITAMIN B12     Status: None   Collection Time    07/31/12  4:08 PM      Result Value Range  Vitamin B-12 746  211 - 911 pg/mL          Assessment & Plan:

## 2012-08-06 ENCOUNTER — Telehealth: Payer: Self-pay

## 2012-08-06 NOTE — Telephone Encounter (Signed)
Pt notified of pap results

## 2012-08-06 NOTE — Telephone Encounter (Signed)
Message copied by Noreene Larsson on Mon Aug 06, 2012  9:50 AM ------      Message from: Jacques Navy      Created: Sun Aug 05, 2012  9:57 PM       Please call patient - normal PAP ------

## 2012-08-14 ENCOUNTER — Other Ambulatory Visit: Payer: Self-pay | Admitting: Internal Medicine

## 2012-09-27 ENCOUNTER — Other Ambulatory Visit: Payer: Self-pay | Admitting: Internal Medicine

## 2013-02-12 ENCOUNTER — Other Ambulatory Visit: Payer: Self-pay | Admitting: Internal Medicine

## 2013-02-18 ENCOUNTER — Other Ambulatory Visit: Payer: Self-pay

## 2013-02-18 MED ORDER — HYDROCHLOROTHIAZIDE 25 MG PO TABS: 25.0000 mg | ORAL_TABLET | Freq: Every day | ORAL | Status: DC

## 2013-03-25 ENCOUNTER — Other Ambulatory Visit: Payer: Self-pay

## 2013-03-26 MED ORDER — SERTRALINE HCL 100 MG PO TABS: ORAL_TABLET | ORAL | Status: DC

## 2013-08-12 ENCOUNTER — Other Ambulatory Visit: Payer: Self-pay

## 2013-08-12 MED ORDER — HYDROCHLOROTHIAZIDE 25 MG PO TABS: 25.0000 mg | ORAL_TABLET | Freq: Every day | ORAL | Status: DC

## 2013-08-12 MED ORDER — ENALAPRIL MALEATE 5 MG PO TABS: ORAL_TABLET | ORAL | Status: DC

## 2013-08-12 MED ORDER — METOPROLOL SUCCINATE ER 100 MG PO TB24: ORAL_TABLET | ORAL | Status: DC

## 2013-10-16 ENCOUNTER — Telehealth: Payer: Self-pay | Admitting: *Deleted

## 2013-10-16 NOTE — Telephone Encounter (Signed)
Left msg on triage Tuesday afternoon needing to get set-up with new md use to see Dr. Linda Hedges. Gave msg to [employer] (scheduler) to call and make appt with Dr. Doug Sou...Shelby Payne

## 2013-10-17 ENCOUNTER — Telehealth: Payer: Self-pay | Admitting: Internal Medicine

## 2013-10-17 NOTE — Telephone Encounter (Signed)
Pt request refill for sertraline 100 mg to be send to [facility]. Pt schedule to see Dr. Doug Sou on 11/07/13 and she was wondering if she can get some until she come for the appt, pt is out of this med. Please advise.

## 2013-10-18 MED ORDER — SERTRALINE HCL 100 MG PO TABS: ORAL_TABLET | ORAL | Status: DC

## 2013-10-18 NOTE — Telephone Encounter (Signed)
1 month refill called in and she will need to keep apt for further refills.   Dr. Doug Sou

## 2013-11-07 ENCOUNTER — Other Ambulatory Visit (INDEPENDENT_AMBULATORY_CARE_PROVIDER_SITE_OTHER): Payer: BC Managed Care – PPO

## 2013-11-07 ENCOUNTER — Ambulatory Visit (INDEPENDENT_AMBULATORY_CARE_PROVIDER_SITE_OTHER): Payer: BC Managed Care – PPO | Admitting: Internal Medicine

## 2013-11-07 ENCOUNTER — Encounter: Payer: Self-pay | Admitting: Internal Medicine

## 2013-11-07 VITALS — BP 138/92 | HR 78 | Temp 98.3°F | Resp 12 | Ht 64.0 in | Wt 228.1 lb

## 2013-11-07 DIAGNOSIS — E669 Obesity, unspecified: Secondary | ICD-10-CM

## 2013-11-07 DIAGNOSIS — F329 Major depressive disorder, single episode, unspecified: Secondary | ICD-10-CM

## 2013-11-07 DIAGNOSIS — E785 Hyperlipidemia, unspecified: Secondary | ICD-10-CM

## 2013-11-07 DIAGNOSIS — F32A Depression, unspecified: Secondary | ICD-10-CM

## 2013-11-07 DIAGNOSIS — Z0000 Encounter for general adult medical examination without abnormal findings: Secondary | ICD-10-CM

## 2013-11-07 DIAGNOSIS — I10 Essential (primary) hypertension: Secondary | ICD-10-CM

## 2013-11-07 LAB — LIPID PANEL
CHOL/HDL RATIO: 4
Cholesterol: 182 mg/dL (ref 0–200)
HDL: 45.4 mg/dL (ref >39.00)
LDL CALC: 104 mg/dL — AB (ref 0–99)
NONHDL: 136.6
TRIGLYCERIDES: 164 mg/dL — AB (ref 0.0–149.0)
VLDL: 32.8 mg/dL (ref 0.0–40.0)

## 2013-11-07 LAB — BASIC METABOLIC PANEL
BUN: 11 mg/dL (ref 6–23)
CALCIUM: 9.6 mg/dL (ref 8.4–10.5)
CHLORIDE: 96 meq/L (ref 96–112)
CO2: 26 meq/L (ref 19–32)
Creatinine, Ser: 0.7 mg/dL (ref 0.4–1.2)
GFR: 85.93 mL/min (ref >60.00)
GLUCOSE: 88 mg/dL (ref 70–99)
Potassium: 3.6 mEq/L (ref 3.5–5.1)
Sodium: 133 mEq/L — ABNORMAL LOW (ref 135–145)

## 2013-11-07 MED ORDER — TRIAMCINOLONE ACETONIDE 0.1 % EX CREA: 1.0000 "application " | TOPICAL_CREAM | Freq: Two times a day (BID) | CUTANEOUS | Status: DC

## 2013-11-07 NOTE — Progress Notes (Signed)
Pre visit review using our clinic review tool, if applicable. No additional management support is needed unless otherwise documented below in the visit note. 

## 2013-11-07 NOTE — Patient Instructions (Signed)
We will check your blood work today and call you with the results.  We have cleaned out your ear which should help.  Come back in about 6-12 months for a check of your blood pressure but feel free to call the office sooner if you have problems or questions.

## 2013-11-08 NOTE — Assessment & Plan Note (Addendum)
Reminded to get mammogram and she will do that. Left ear wax removed and hearing much better and sensation gone.

## 2013-11-08 NOTE — Progress Notes (Signed)
   Subjective:    Patient ID: Shelby Payne, female    DOB: [birth year], 58 y.o.   MRN: [id number]  HPI The patient is a 58 YO woman who is coming in today to establish care. She has PMH of HTN, depression, obesity, hyperlipidemia. She is also having some popping and fullness in her left ear. She denies any other complaints and feels that she is doing well. She thinks that her mood is well controlled at this time.   Review of Systems  Constitutional: Negative for fever, activity change, appetite change and fatigue.  HENT: Positive for ear pain.   Respiratory: Negative for cough, chest tightness, shortness of breath and wheezing.   Cardiovascular: Negative for chest pain, palpitations and leg swelling.  Gastrointestinal: Negative for abdominal pain, diarrhea, constipation and abdominal distention.  Musculoskeletal: Negative.   Skin: Negative.   Neurological: Negative.   Psychiatric/Behavioral: Negative.       Objective:   Physical Exam  Vitals reviewed. Constitutional: She is oriented to person, place, and time. She appears well-developed and well-nourished.  HENT:  Head: Normocephalic and atraumatic.  Eyes: EOM are normal.  Right ear clear, left ear with obstructive wax.   Neck: Normal range of motion.  Cardiovascular: Normal rate and regular rhythm.   Pulmonary/Chest: Effort normal and breath sounds normal. No respiratory distress. She has no wheezes. She has no rales.  Abdominal: Soft. Bowel sounds are normal. She exhibits no distension. There is no tenderness. There is no rebound.  Musculoskeletal: She exhibits no edema.  Neurological: She is alert and oriented to person, place, and time. Coordination normal.  Skin: Skin is warm and dry.   Filed Vitals:   11/07/13 1616  BP: 138/92  Pulse: 78  Temp: 98.3 F (36.8 C)  TempSrc: Oral  Resp: 12  Height: 5\' 4"  (1.626 m)  Weight: 228 lb 1.9 oz (103.475 kg)  SpO2: 95%      Assessment & Plan:

## 2013-11-08 NOTE — Assessment & Plan Note (Signed)
She will increase her exercise and has been working on losing weight and has lost a few pounds.

## 2013-11-08 NOTE — Assessment & Plan Note (Signed)
Doing well with the zoloft and would continue.

## 2013-11-08 NOTE — Assessment & Plan Note (Signed)
- 

## 2013-11-08 NOTE — Assessment & Plan Note (Signed)
BP well controlled on her enalapril, hctz, toprol-xl. Check BMP today.

## 2013-11-15 ENCOUNTER — Other Ambulatory Visit: Payer: Self-pay | Admitting: Geriatric Medicine

## 2013-11-15 ENCOUNTER — Other Ambulatory Visit: Payer: Self-pay | Admitting: Internal Medicine

## 2013-11-15 MED ORDER — METOPROLOL SUCCINATE ER 100 MG PO TB24: ORAL_TABLET | ORAL | Status: DC

## 2013-11-15 MED ORDER — ENALAPRIL MALEATE 5 MG PO TABS: ORAL_TABLET | ORAL | Status: DC

## 2013-11-15 MED ORDER — HYDROCHLOROTHIAZIDE 25 MG PO TABS: 25.0000 mg | ORAL_TABLET | Freq: Every day | ORAL | Status: DC

## 2013-12-31 ENCOUNTER — Encounter: Payer: Self-pay | Admitting: Internal Medicine

## 2014-02-11 ENCOUNTER — Other Ambulatory Visit: Payer: Self-pay | Admitting: Internal Medicine

## 2014-02-18 ENCOUNTER — Other Ambulatory Visit: Payer: Self-pay | Admitting: Geriatric Medicine

## 2014-02-18 MED ORDER — METOPROLOL SUCCINATE ER 100 MG PO TB24: ORAL_TABLET | ORAL | Status: DC

## 2014-02-18 MED ORDER — HYDROCHLOROTHIAZIDE 25 MG PO TABS: 25.0000 mg | ORAL_TABLET | Freq: Every day | ORAL | Status: DC

## 2014-02-19 ENCOUNTER — Other Ambulatory Visit: Payer: Self-pay | Admitting: *Deleted

## 2014-02-19 MED ORDER — ENALAPRIL MALEATE 5 MG PO TABS: ORAL_TABLET | ORAL | Status: DC

## 2014-05-11 ENCOUNTER — Other Ambulatory Visit: Payer: Self-pay | Admitting: Internal Medicine

## 2014-08-08 ENCOUNTER — Other Ambulatory Visit: Payer: Self-pay | Admitting: Internal Medicine

## 2014-11-07 ENCOUNTER — Other Ambulatory Visit: Payer: Self-pay | Admitting: Internal Medicine

## 2014-11-13 ENCOUNTER — Other Ambulatory Visit: Payer: Self-pay | Admitting: Internal Medicine

## 2015-02-11 ENCOUNTER — Other Ambulatory Visit: Payer: Self-pay | Admitting: Internal Medicine

## 2015-05-05 ENCOUNTER — Other Ambulatory Visit: Payer: Self-pay | Admitting: Internal Medicine

## 2015-05-14 ENCOUNTER — Other Ambulatory Visit: Payer: Self-pay

## 2015-05-14 MED ORDER — SERTRALINE HCL 100 MG PO TABS: 100.0000 mg | ORAL_TABLET | Freq: Every day | ORAL | Status: DC

## 2015-08-05 ENCOUNTER — Other Ambulatory Visit: Payer: Self-pay | Admitting: Internal Medicine

## 2015-08-06 ENCOUNTER — Other Ambulatory Visit: Payer: Self-pay | Admitting: *Deleted

## 2015-08-10 ENCOUNTER — Telehealth: Payer: Self-pay | Admitting: Internal Medicine

## 2015-08-10 NOTE — Telephone Encounter (Signed)
Pt called in and wants to know if all of her meds can be refilled til the 31st of July for cpe?    [facility] on [address]  [place]

## 2015-08-12 ENCOUNTER — Other Ambulatory Visit: Payer: Self-pay | Admitting: Internal Medicine

## 2015-08-13 ENCOUNTER — Other Ambulatory Visit: Payer: Self-pay | Admitting: Internal Medicine

## 2015-08-17 NOTE — Telephone Encounter (Signed)
Called and left a message for patient to call back. I need to know what meds she needs filled.

## 2015-09-07 ENCOUNTER — Encounter: Payer: Self-pay | Admitting: Internal Medicine

## 2015-09-07 ENCOUNTER — Other Ambulatory Visit (INDEPENDENT_AMBULATORY_CARE_PROVIDER_SITE_OTHER): Payer: BLUE CROSS/BLUE SHIELD

## 2015-09-07 ENCOUNTER — Ambulatory Visit (INDEPENDENT_AMBULATORY_CARE_PROVIDER_SITE_OTHER): Payer: BLUE CROSS/BLUE SHIELD | Admitting: Internal Medicine

## 2015-09-07 VITALS — BP 142/108 | HR 104 | Temp 98.3°F | Wt 238.0 lb

## 2015-09-07 DIAGNOSIS — Z1159 Encounter for screening for other viral diseases: Secondary | ICD-10-CM

## 2015-09-07 DIAGNOSIS — F329 Major depressive disorder, single episode, unspecified: Secondary | ICD-10-CM

## 2015-09-07 DIAGNOSIS — Z0000 Encounter for general adult medical examination without abnormal findings: Secondary | ICD-10-CM

## 2015-09-07 DIAGNOSIS — I10 Essential (primary) hypertension: Secondary | ICD-10-CM

## 2015-09-07 DIAGNOSIS — E669 Obesity, unspecified: Secondary | ICD-10-CM

## 2015-09-07 DIAGNOSIS — F32A Depression, unspecified: Secondary | ICD-10-CM

## 2015-09-07 LAB — COMPREHENSIVE METABOLIC PANEL
ALT: 30 U/L (ref 0–35)
AST: 23 U/L (ref 0–37)
Albumin: 4.6 g/dL (ref 3.5–5.2)
Alkaline Phosphatase: 48 U/L (ref 39–117)
BUN: 14 mg/dL (ref 6–23)
CO2: 26 mEq/L (ref 19–32)
Calcium: 10 mg/dL (ref 8.4–10.5)
Chloride: 96 mEq/L (ref 96–112)
Creatinine, Ser: 0.72 mg/dL (ref 0.40–1.20)
GFR: 88.13 mL/min (ref >60.00)
Glucose, Bld: 106 mg/dL — ABNORMAL HIGH (ref 70–99)
Potassium: 3.8 mEq/L (ref 3.5–5.1)
Sodium: 134 mEq/L — ABNORMAL LOW (ref 135–145)
Total Bilirubin: 0.5 mg/dL (ref 0.2–1.2)
Total Protein: 8.4 g/dL — ABNORMAL HIGH (ref 6.0–8.3)

## 2015-09-07 LAB — LIPID PANEL
Cholesterol: 206 mg/dL — ABNORMAL HIGH (ref 0–200)
HDL: 55.3 mg/dL (ref >39.00)
LDL Cholesterol: 125 mg/dL — ABNORMAL HIGH (ref 0–99)
NonHDL: 150.31
Total CHOL/HDL Ratio: 4
Triglycerides: 128 mg/dL (ref 0.0–149.0)
VLDL: 25.6 mg/dL (ref 0.0–40.0)

## 2015-09-07 LAB — CBC
HEMATOCRIT: 41 % (ref 36.0–46.0)
HEMOGLOBIN: 14.2 g/dL (ref 12.0–15.0)
MCHC: 34.7 g/dL (ref 30.0–36.0)
MCV: 86.8 fl (ref 78.0–100.0)
PLATELETS: 243 10*3/uL (ref 150.0–400.0)
RBC: 4.72 Mil/uL (ref 3.87–5.11)
RDW: 13 % (ref 11.5–15.5)
WBC: 10.7 10*3/uL — AB (ref 4.0–10.5)

## 2015-09-07 NOTE — Assessment & Plan Note (Signed)
Colonoscopy due 2023, checking labs today. Checking hep c screening. Immunizations up to date. Counseled on the dangers of distracted driving and mole surveillance. Given screening recommendations.

## 2015-09-07 NOTE — Progress Notes (Signed)
Pre visit review using our clinic review tool, if applicable. No additional management support is needed unless otherwise documented below in the visit note. 

## 2015-09-07 NOTE — Progress Notes (Signed)
   Subjective:    Patient ID: Shelby Payne, female    DOB: [birth year], 60 y.o.   MRN: [id number]  HPI The patient is a 60 YO female coming in for wellness. She has not been in for about 2 years and is still taking her medications.   PMH, [facility], social history reviewed and updated.   Review of Systems  Constitutional: Negative for activity change, appetite change, fatigue and fever.  HENT: Negative.   Eyes: Negative.   Respiratory: Negative for cough, chest tightness, shortness of breath and wheezing.   Cardiovascular: Negative for chest pain, palpitations and leg swelling.  Gastrointestinal: Negative for abdominal distention, abdominal pain, constipation and diarrhea.  Musculoskeletal: Negative.   Skin: Negative.   Neurological: Negative.   Psychiatric/Behavioral: Negative.       Objective:   Physical Exam  Constitutional: She is oriented to person, place, and time. She appears well-developed and well-nourished.  HENT:  Head: Normocephalic and atraumatic.  Eyes: EOM are normal.  Neck: Normal range of motion.  Cardiovascular: Normal rate and regular rhythm.   Carotids without bruit  Pulmonary/Chest: Effort normal and breath sounds normal. No respiratory distress. She has no wheezes. She has no rales.  Abdominal: Soft. Bowel sounds are normal. She exhibits no distension. There is no tenderness. There is no rebound.  Musculoskeletal: She exhibits no edema.  Neurological: She is alert and oriented to person, place, and time. Coordination normal.  Skin: Skin is warm and dry.  Psychiatric: She has a normal mood and affect.   Vitals:   09/07/15 1449  BP: (!) 144/92  Pulse: (!) 104  Temp: 98.3 F (36.8 C)  SpO2: 97%  Weight: 238 lb (108 kg)      Assessment & Plan:

## 2015-09-07 NOTE — Assessment & Plan Note (Signed)
BP slightly above goal but normal at home. Checking CMP and if okay will send in refills. Taking hctz, enalapril, metoprolol daily.

## 2015-09-07 NOTE — Assessment & Plan Note (Signed)
Counseled her on the need to start doing some exercise for her health.

## 2015-09-07 NOTE — Assessment & Plan Note (Signed)
Taking zoloft and doing well, no changes today and she does not want to try to wean.

## 2015-09-07 NOTE — Patient Instructions (Signed)
We will check the labs today and when we get the results we will send in the results.   Work on starting an exercise program as this is a very good thing to do for your health building up to 30 minutes 4-5 times per week.    Exercising to Stay Healthy Exercising regularly is important. It has many health benefits, such as:  Improving your overall fitness, flexibility, and endurance.  Increasing your bone density.  Helping with weight control.  Decreasing your body fat.  Increasing your muscle strength.  Reducing stress and tension.  Improving your overall health. In order to become healthy and stay healthy, it is recommended that you do moderate-intensity and vigorous-intensity exercise. You can tell that you are exercising at a moderate intensity if you have a higher heart rate and faster breathing, but you are still able to hold a conversation. You can tell that you are exercising at a vigorous intensity if you are breathing much harder and faster and cannot hold a conversation while exercising. HOW OFTEN SHOULD I EXERCISE? Choose an activity that you enjoy and set realistic goals. Your health care provider can help you to make an activity plan that works for you. Exercise regularly as directed by your health care provider. This may include:   Doing resistance training twice each week, such as:  Push-ups.  Sit-ups.  Lifting weights.  Using resistance bands.  Doing a given intensity of exercise for a given amount of time. Choose from these options:  150 minutes of moderate-intensity exercise every week.  75 minutes of vigorous-intensity exercise every week.  A mix of moderate-intensity and vigorous-intensity exercise every week. Children, pregnant women, people who are out of shape, people who are overweight, and older adults may need to consult a health care provider for individual recommendations. If you have any sort of medical condition, be sure to consult your health  care provider before starting a new exercise program.  WHAT ARE SOME EXERCISE IDEAS? Some moderate-intensity exercise ideas include:   Walking at a rate of 1 mile in 15 minutes.  Biking.  Hiking.  Golfing.  Dancing. Some vigorous-intensity exercise ideas include:   Walking at a rate of at least 4.5 miles per hour.  Jogging or running at a rate of 5 miles per hour.  Biking at a rate of at least 10 miles per hour.  Lap swimming.  Roller-skating or in-line skating.  Cross-country skiing.  Vigorous competitive sports, such as football, basketball, and soccer.  Jumping rope.  Aerobic dancing. WHAT ARE SOME EVERYDAY ACTIVITIES THAT CAN HELP ME TO GET EXERCISE?  Yard work, such as:  Psychologist, educational.  Raking and bagging leaves.  Washing and waxing your car.  Pushing a stroller.  Shoveling snow.  Gardening.  Washing windows or floors. HOW CAN I BE MORE ACTIVE IN MY DAY-TO-DAY ACTIVITIES?  Use the stairs instead of the elevator.  Take a walk during your lunch break.  If you drive, park your car farther away from work or school.  If you take public transportation, get off one stop early and walk the rest of the way.  Make all of your phone calls while standing up and walking around.  Get up, stretch, and walk around every 30 minutes throughout the day. WHAT GUIDELINES SHOULD I FOLLOW WHILE EXERCISING?  Do not exercise so much that you hurt yourself, feel dizzy, or get very short of breath.  Consult your health care provider before starting a new exercise  program.  Wear comfortable clothes and shoes with good support.  Drink plenty of water while you exercise to prevent dehydration or heat stroke. Body water is lost during exercise and must be replaced.  Work out until you breathe faster and your heart beats faster.   This information is not intended to replace advice given to you by your health care provider. Make sure you discuss any questions you  have with your health care provider.   Document Released: 02/26/2010 Document Revised: 02/14/2014 Document Reviewed: 06/27/2013 Elsevier Interactive Patient Education 2016 [place] Maintenance, Female Adopting a healthy lifestyle and getting preventive care can go a long way to promote health and wellness. Talk with your health care provider about what schedule of regular examinations is right for you. This is a good chance for you to check in with your provider about disease prevention and staying healthy. In between checkups, there are plenty of things you can do on your own. Experts have done a lot of research about which lifestyle changes and preventive measures are most likely to keep you healthy. Ask your health care provider for more information. WEIGHT AND DIET  Eat a healthy diet  Be sure to include plenty of vegetables, fruits, low-fat dairy products, and lean protein.  Do not eat a lot of foods high in solid fats, added sugars, or salt.  Get regular exercise. This is one of the most important things you can do for your health.  Most adults should exercise for at least 150 minutes each week. The exercise should increase your heart rate and make you sweat (moderate-intensity exercise).  Most adults should also do strengthening exercises at least twice a week. This is in addition to the moderate-intensity exercise.  Maintain a healthy weight  Body mass index (BMI) is a measurement that can be used to identify possible weight problems. It estimates body fat based on height and weight. Your health care provider can help determine your BMI and help you achieve or maintain a healthy weight.  For females 9 years of age and older:   A BMI below 18.5 is considered underweight.  A BMI of 18.5 to 24.9 is normal.  A BMI of 25 to 29.9 is considered overweight.  A BMI of 30 and above is considered obese.  Watch levels of cholesterol and blood lipids  You should start  having your blood tested for lipids and cholesterol at 59 years of age, then have this test every 5 years.  You may need to have your cholesterol levels checked more often if:  Your lipid or cholesterol levels are high.  You are older than 59 years of age.  You are at high risk for heart disease.  CANCER SCREENING   Lung Cancer  Lung cancer screening is recommended for adults 3-51 years old who are at high risk for lung cancer because of a history of smoking.  A yearly low-dose CT scan of the lungs is recommended for people who:  Currently smoke.  Have quit within the past 15 years.  Have at least a 30-pack-year history of smoking. A pack year is smoking an average of one pack of cigarettes a day for 1 year.  Yearly screening should continue until it has been 15 years since you quit.  Yearly screening should stop if you develop a health problem that would prevent you from having lung cancer treatment.  Breast Cancer  Practice breast self-awareness. This means understanding how your breasts normally appear and  feel.  It also means doing regular breast self-exams. Let your health care provider know about any changes, no matter how small.  If you are in your 20s or 30s, you should have a clinical breast exam (CBE) by a health care provider every 1-3 years as part of a regular health exam.  If you are 64 or older, have a CBE every year. Also consider having a breast X-ray (mammogram) every year.  If you have a family history of breast cancer, talk to your health care provider about genetic screening.  If you are at high risk for breast cancer, talk to your health care provider about having an MRI and a mammogram every year.  Breast cancer gene (BRCA) assessment is recommended for women who have family members with BRCA-related cancers. BRCA-related cancers include:  Breast.  Ovarian.  Tubal.  Peritoneal cancers.  Results of the assessment will determine the need for  genetic counseling and BRCA1 and BRCA2 testing. Cervical Cancer Your health care provider may recommend that you be screened regularly for cancer of the pelvic organs (ovaries, uterus, and vagina). This screening involves a pelvic examination, including checking for microscopic changes to the surface of your cervix (Pap test). You may be encouraged to have this screening done every 3 years, beginning at age 29.  For women ages 42-65, health care providers may recommend pelvic exams and Pap testing every 3 years, or they may recommend the Pap and pelvic exam, combined with testing for human papilloma virus (HPV), every 5 years. Some types of HPV increase your risk of cervical cancer. Testing for HPV may also be done on women of any age with unclear Pap test results.  Other health care providers may not recommend any screening for nonpregnant women who are considered low risk for pelvic cancer and who do not have symptoms. Ask your health care provider if a screening pelvic exam is right for you.  If you have had past treatment for cervical cancer or a condition that could lead to cancer, you need Pap tests and screening for cancer for at least 20 years after your treatment. If Pap tests have been discontinued, your risk factors (such as having a new sexual partner) need to be reassessed to determine if screening should resume. Some women have medical problems that increase the chance of getting cervical cancer. In these cases, your health care provider may recommend more frequent screening and Pap tests. Colorectal Cancer  This type of cancer can be detected and often prevented.  Routine colorectal cancer screening usually begins at 59 years of age and continues through 59 years of age.  Your health care provider may recommend screening at an earlier age if you have risk factors for colon cancer.  Your health care provider may also recommend using home test kits to check for hidden blood in the  stool.  A small camera at the end of a tube can be used to examine your colon directly (sigmoidoscopy or colonoscopy). This is done to check for the earliest forms of colorectal cancer.  Routine screening usually begins at age 70.  Direct examination of the colon should be repeated every 5-10 years through 59 years of age. However, you may need to be screened more often if early forms of precancerous polyps or small growths are found. Skin Cancer  Check your skin from head to toe regularly.  Tell your health care provider about any new moles or changes in moles, especially if there is a change  in a mole's shape or color.  Also tell your health care provider if you have a mole that is larger than the size of a pencil eraser.  Always use sunscreen. Apply sunscreen liberally and repeatedly throughout the day.  Protect yourself by wearing long sleeves, pants, a wide-brimmed hat, and sunglasses whenever you are outside. HEART DISEASE, DIABETES, AND HIGH BLOOD PRESSURE   High blood pressure causes heart disease and increases the risk of stroke. High blood pressure is more likely to develop in:  People who have blood pressure in the high end of the normal range (130-139/85-89 mm Hg).  People who are overweight or obese.  People who are African American.  If you are 95-53 years of age, have your blood pressure checked every 3-5 years. If you are 57 years of age or older, have your blood pressure checked every year. You should have your blood pressure measured twice--once when you are at a hospital or clinic, and once when you are not at a hospital or clinic. Record the average of the two measurements. To check your blood pressure when you are not at a hospital or clinic, you can use:  An automated blood pressure machine at a pharmacy.  A home blood pressure monitor.  If you are between 16 years and 46 years old, ask your health care provider if you should take aspirin to prevent  strokes.  Have regular diabetes screenings. This involves taking a blood sample to check your fasting blood sugar level.  If you are at a normal weight and have a low risk for diabetes, have this test once every three years after 59 years of age.  If you are overweight and have a high risk for diabetes, consider being tested at a younger age or more often. PREVENTING INFECTION  Hepatitis B  If you have a higher risk for hepatitis B, you should be screened for this virus. You are considered at high risk for hepatitis B if:  You were born in a country where hepatitis B is common. Ask your health care provider which countries are considered high risk.  Your parents were born in a high-risk country, and you have not been immunized against hepatitis B (hepatitis B vaccine).  You have HIV or AIDS.  You use needles to inject street drugs.  You live with someone who has hepatitis B.  You have had sex with someone who has hepatitis B.  You get hemodialysis treatment.  You take certain medicines for conditions, including cancer, organ transplantation, and autoimmune conditions. Hepatitis C  Blood testing is recommended for:  Everyone born from [birth year] through 1964.  Anyone with known risk factors for hepatitis C. Sexually transmitted infections (STIs)  You should be screened for sexually transmitted infections (STIs) including gonorrhea and chlamydia if:  You are sexually active and are younger than 59 years of age.  You are older than 59 years of age and your health care provider tells you that you are at risk for this type of infection.  Your sexual activity has changed since you were last screened and you are at an increased risk for chlamydia or gonorrhea. Ask your health care provider if you are at risk.  If you do not have HIV, but are at risk, it may be recommended that you take a prescription medicine daily to prevent HIV infection. This is called pre-exposure prophylaxis  (PrEP). You are considered at risk if:  You are sexually active and do not regularly use condoms  or know the HIV status of your partner(s).  You take drugs by injection.  You are sexually active with a partner who has HIV. Talk with your health care provider about whether you are at high risk of being infected with HIV. If you choose to begin PrEP, you should first be tested for HIV. You should then be tested every 3 months for as long as you are taking PrEP.  PREGNANCY   If you are premenopausal and you may become pregnant, ask your health care provider about preconception counseling.  If you may become pregnant, take 400 to 800 micrograms (mcg) of folic acid every day.  If you want to prevent pregnancy, talk to your health care provider about birth control (contraception). OSTEOPOROSIS AND MENOPAUSE   Osteoporosis is a disease in which the bones lose minerals and strength with aging. This can result in serious bone fractures. Your risk for osteoporosis can be identified using a bone density scan.  If you are 74 years of age or older, or if you are at risk for osteoporosis and fractures, ask your health care provider if you should be screened.  Ask your health care provider whether you should take a calcium or vitamin D supplement to lower your risk for osteoporosis.  Menopause may have certain physical symptoms and risks.  Hormone replacement therapy may reduce some of these symptoms and risks. Talk to your health care provider about whether hormone replacement therapy is right for you.  HOME CARE INSTRUCTIONS   Schedule regular health, dental, and eye exams.  Stay current with your immunizations.   Do not use any tobacco products including cigarettes, chewing tobacco, or electronic cigarettes.  If you are pregnant, do not drink alcohol.  If you are breastfeeding, limit how much and how often you drink alcohol.  Limit alcohol intake to no more than 1 drink per day for  nonpregnant women. One drink equals 12 ounces of beer, 5 ounces of wine, or 1 ounces of hard liquor.  Do not use street drugs.  Do not share needles.  Ask your health care provider for help if you need support or information about quitting drugs.  Tell your health care provider if you often feel depressed.  Tell your health care provider if you have ever been abused or do not feel safe at home.   This information is not intended to replace advice given to you by your health care provider. Make sure you discuss any questions you have with your health care provider.   Document Released: 08/09/2010 Document Revised: 02/14/2014 Document Reviewed: 12/26/2012 Elsevier Interactive Patient Education [employer].

## 2015-09-08 LAB — HEPATITIS C ANTIBODY: HCV Ab: NEGATIVE

## 2015-09-08 MED ORDER — METOPROLOL SUCCINATE ER 100 MG PO TB24: 100.0000 mg | ORAL_TABLET | Freq: Every day | ORAL | 3 refills | Status: DC

## 2015-09-08 MED ORDER — HYDROCHLOROTHIAZIDE 25 MG PO TABS: 25.0000 mg | ORAL_TABLET | Freq: Every day | ORAL | 3 refills | Status: DC

## 2015-09-08 MED ORDER — ENALAPRIL MALEATE 5 MG PO TABS: 5.0000 mg | ORAL_TABLET | Freq: Every day | ORAL | 3 refills | Status: DC

## 2015-09-08 MED ORDER — SERTRALINE HCL 100 MG PO TABS: 100.0000 mg | ORAL_TABLET | Freq: Every day | ORAL | 3 refills | Status: DC

## 2015-11-16 ENCOUNTER — Encounter: Payer: Self-pay | Admitting: Internal Medicine

## 2015-11-28 ENCOUNTER — Other Ambulatory Visit
Admission: RE | Admit: 2015-11-28 | Discharge: 2015-11-28 | Disposition: A | Discharge status: Home / Self Care | Payer: BLUE CROSS/BLUE SHIELD | Source: Ambulatory Visit | Attending: Internal Medicine | Admitting: Internal Medicine

## 2015-11-28 DIAGNOSIS — L02211 Cutaneous abscess of abdominal wall: Secondary | ICD-10-CM | POA: Insufficient documentation

## 2015-11-28 LAB — COMPREHENSIVE METABOLIC PANEL
ALK PHOS: 44 U/L (ref 38–126)
ALT: 21 U/L (ref 14–54)
ANION GAP: 12 (ref 5–15)
AST: 25 U/L (ref 15–41)
Albumin: 3.9 g/dL (ref 3.5–5.0)
BUN: 10 mg/dL (ref 6–20)
CALCIUM: 8.7 mg/dL — AB (ref 8.9–10.3)
CO2: 20 mmol/L — AB (ref 22–32)
CREATININE: 0.77 mg/dL (ref 0.44–1.00)
Chloride: 98 mmol/L — ABNORMAL LOW (ref 101–111)
Glucose, Bld: 115 mg/dL — ABNORMAL HIGH (ref 65–99)
Potassium: 3.8 mmol/L (ref 3.5–5.1)
Sodium: 130 mmol/L — ABNORMAL LOW (ref 135–145)
TOTAL PROTEIN: 7.1 g/dL (ref 6.5–8.1)

## 2015-11-28 LAB — CBC WITH DIFFERENTIAL/PLATELET
Basophils Absolute: 0.1 10*3/uL (ref 0–0.1)
Basophils Relative: 2 %
Eosinophils Absolute: 0.2 10*3/uL (ref 0–0.7)
Eosinophils Relative: 3 %
HCT: 37.6 % (ref 35.0–47.0)
HEMOGLOBIN: 13.2 g/dL (ref 12.0–16.0)
LYMPHS ABS: 0.5 10*3/uL — AB (ref 1.0–3.6)
LYMPHS PCT: 10 %
MCH: 30.9 pg (ref 26.0–34.0)
MCHC: 35.1 g/dL (ref 32.0–36.0)
MCV: 88 fL (ref 80.0–100.0)
MONOS PCT: 12 %
Monocytes Absolute: 0.6 10*3/uL (ref 0.2–0.9)
NEUTROS PCT: 73 %
Neutro Abs: 3.4 10*3/uL (ref 1.4–6.5)
Platelets: 156 10*3/uL (ref 150–440)
RBC: 4.27 MIL/uL (ref 3.80–5.20)
RDW: 13.6 % (ref 11.5–14.5)
WBC: 4.7 10*3/uL (ref 3.6–11.0)

## 2015-11-30 ENCOUNTER — Encounter: Payer: Self-pay | Admitting: Internal Medicine

## 2015-12-03 ENCOUNTER — Encounter: Payer: Self-pay | Admitting: Internal Medicine

## 2015-12-03 ENCOUNTER — Ambulatory Visit (INDEPENDENT_AMBULATORY_CARE_PROVIDER_SITE_OTHER): Payer: BLUE CROSS/BLUE SHIELD | Admitting: Internal Medicine

## 2015-12-03 ENCOUNTER — Other Ambulatory Visit (INDEPENDENT_AMBULATORY_CARE_PROVIDER_SITE_OTHER): Payer: BLUE CROSS/BLUE SHIELD

## 2015-12-03 VITALS — BP 144/92 | HR 80 | Temp 98.5°F | Resp 16 | Ht 64.0 in | Wt 228.1 lb

## 2015-12-03 DIAGNOSIS — R739 Hyperglycemia, unspecified: Secondary | ICD-10-CM

## 2015-12-03 DIAGNOSIS — L03319 Cellulitis of trunk, unspecified: Secondary | ICD-10-CM

## 2015-12-03 DIAGNOSIS — I10 Essential (primary) hypertension: Secondary | ICD-10-CM | POA: Diagnosis not present

## 2015-12-03 DIAGNOSIS — L02219 Cutaneous abscess of trunk, unspecified: Secondary | ICD-10-CM | POA: Diagnosis not present

## 2015-12-03 LAB — BASIC METABOLIC PANEL
BUN: 14 mg/dL (ref 6–23)
CHLORIDE: 100 meq/L (ref 96–112)
CO2: 29 meq/L (ref 19–32)
CREATININE: 0.61 mg/dL (ref 0.40–1.20)
Calcium: 9.6 mg/dL (ref 8.4–10.5)
GFR: 106.62 mL/min (ref >60.00)
GLUCOSE: 93 mg/dL (ref 70–99)
Potassium: 3.9 mEq/L (ref 3.5–5.1)
Sodium: 138 mEq/L (ref 135–145)

## 2015-12-03 LAB — CORTISOL: Cortisol, Plasma: 9.2 ug/dL

## 2015-12-03 LAB — HEMOGLOBIN A1C: HEMOGLOBIN A1C: 5.5 % (ref 4.6–6.5)

## 2015-12-03 MED ORDER — TELMISARTAN-AMLODIPINE 40-5 MG PO TABS: 1.0000 | ORAL_TABLET | Freq: Every day | ORAL | 1 refills | Status: DC

## 2015-12-03 NOTE — Patient Instructions (Signed)
Hypertension Hypertension, commonly called high blood pressure, is when the force of blood pumping through your arteries is too strong. Your arteries are the blood vessels that carry blood from your heart throughout your body. A blood pressure reading consists of a higher number over a lower number, such as 110/72. The higher number (systolic) is the pressure inside your arteries when your heart pumps. The lower number (diastolic) is the pressure inside your arteries when your heart relaxes. Ideally you want your blood pressure below 120/80. Hypertension forces your heart to work harder to pump blood. Your arteries may become narrow or stiff. Having untreated or uncontrolled hypertension can cause heart attack, stroke, kidney disease, and other problems. RISK FACTORS Some risk factors for high blood pressure are controllable. Others are not.  Risk factors you cannot control include:   Race. You may be at higher risk if you are African American.  Age. Risk increases with age.  Gender. Men are at higher risk than women before age 45 years. After age 65, women are at higher risk than men. Risk factors you can control include:  Not getting enough exercise or physical activity.  Being overweight.  Getting too much fat, sugar, calories, or salt in your diet.  Drinking too much alcohol. SIGNS AND SYMPTOMS Hypertension does not usually cause signs or symptoms. Extremely high blood pressure (hypertensive crisis) may cause headache, anxiety, shortness of breath, and nosebleed. DIAGNOSIS To check if you have hypertension, your health care provider will measure your blood pressure while you are seated, with your arm held at the level of your heart. It should be measured at least twice using the same arm. Certain conditions can cause a difference in blood pressure between your right and left arms. A blood pressure reading that is higher than normal on one occasion does not mean that you need treatment. If  it is not clear whether you have high blood pressure, you may be asked to return on a different day to have your blood pressure checked again. Or, you may be asked to monitor your blood pressure at home for 1 or more weeks. TREATMENT Treating high blood pressure includes making lifestyle changes and possibly taking medicine. Living a healthy lifestyle can help lower high blood pressure. You may need to change some of your habits. Lifestyle changes may include:  Following the DASH diet. This diet is high in fruits, vegetables, and whole grains. It is low in salt, red meat, and added sugars.  Keep your sodium intake below 2,300 mg per day.  Getting at least 30-45 minutes of aerobic exercise at least 4 times per week.  Losing weight if necessary.  Not smoking.  Limiting alcoholic beverages.  Learning ways to reduce stress. Your health care provider may prescribe medicine if lifestyle changes are not enough to get your blood pressure under control, and if one of the following is true:  You are 18-59 years of age and your systolic blood pressure is above 140.  You are 60 years of age or older, and your systolic blood pressure is above 150.  Your diastolic blood pressure is above 90.  You have diabetes, and your systolic blood pressure is over 140 or your diastolic blood pressure is over 90.  You have kidney disease and your blood pressure is above 140/90.  You have heart disease and your blood pressure is above 140/90. Your personal target blood pressure may vary depending on your medical conditions, your age, and other factors. HOME CARE INSTRUCTIONS    Have your blood pressure rechecked as directed by your health care provider.   Take medicines only as directed by your health care provider. Follow the directions carefully. Blood pressure medicines must be taken as prescribed. The medicine does not work as well when you skip doses. Skipping doses also puts you at risk for  problems.  Do not smoke.   Monitor your blood pressure at home as directed by your health care provider. SEEK MEDICAL CARE IF:   You think you are having a reaction to medicines taken.  You have recurrent headaches or feel dizzy.  You have swelling in your ankles.  You have trouble with your vision. SEEK IMMEDIATE MEDICAL CARE IF:  You develop a severe headache or confusion.  You have unusual weakness, numbness, or feel faint.  You have severe chest or abdominal pain.  You vomit repeatedly.  You have trouble breathing. MAKE SURE YOU:   Understand these instructions.  Will watch your condition.  Will get help right away if you are not doing well or get worse.   This information is not intended to replace advice given to you by your health care provider. Make sure you discuss any questions you have with your health care provider.   Document Released: 01/24/2005 Document Revised: 06/10/2014 Document Reviewed: 11/16/2012 Elsevier Interactive Patient Education 2016 [employer]  

## 2015-12-03 NOTE — Progress Notes (Signed)
Pre visit review using our clinic review tool, if applicable. No additional management support is needed unless otherwise documented below in the visit note. 

## 2015-12-03 NOTE — Progress Notes (Signed)
Subjective:  Patient ID: Shelby Payne, female    DOB: 09-29-1955  Age: 60 y.o. MRN: [id number]  CC: Hypertension and Wound Check  New to me  HPI Shelby Payne presents for a BP check and recheck of an abscess over her right upper abd that was drained about 7 days ago at an [facility]. She dose not know If a clx was done but she was treated with bactrim. The bactrim caused a red/itchy rash about 3 days ago so she stopped taking it and the rash has resolved. The area over the abd has healed well with no pain, swelling, drainage, or redness over the last few days.   During all of this she was seen in the ER for fever/N/V and labs showed an elevated BS, low sodium, and low CO2. She was told to stop taking HCTZ and has felt much better over the last few days.  Outpatient Medications Prior to Visit  Medication Sig Dispense Refill  . metoprolol succinate (TOPROL-XL) 100 MG 24 hr tablet Take 1 tablet (100 mg total) by mouth daily. Take with or immediately following a meal. 90 tablet 3  . sertraline (ZOLOFT) 100 MG tablet Take 1 tablet (100 mg total) by mouth daily. 90 tablet 3  . triamcinolone cream (KENALOG) 0.1 % Apply 1 application topically 2 (two) times daily. 30 g 0  . enalapril (VASOTEC) 5 MG tablet Take 1 tablet (5 mg total) by mouth daily. 90 tablet 3  . hydrochlorothiazide (HYDRODIURIL) 25 MG tablet Take 1 tablet (25 mg total) by mouth daily. 90 tablet 3   No facility-administered medications prior to visit.     ROS Review of Systems  Constitutional: Negative for activity change, appetite change, chills, fatigue and fever.  HENT: Negative.  Negative for trouble swallowing and voice change.   Eyes: Negative.  Negative for photophobia and visual disturbance.  Respiratory: Negative.  Negative for cough, choking, chest tightness, shortness of breath and stridor.   Cardiovascular: Negative for chest pain, palpitations and leg swelling.  Gastrointestinal: Negative.  Negative for  abdominal pain, constipation, diarrhea, nausea and vomiting.  Endocrine: Positive for polydipsia. Negative for polyphagia and polyuria.  Genitourinary: Negative.  Negative for decreased urine volume, difficulty urinating, dysuria, hematuria and urgency.  Musculoskeletal: Negative for arthralgias, back pain, myalgias and neck pain.  Skin: Positive for rash and wound. Negative for color change and pallor.  Neurological: Negative.  Negative for dizziness, tremors, weakness and numbness.  Hematological: Negative.  Negative for adenopathy. Does not bruise/bleed easily.  Psychiatric/Behavioral: Negative.     Objective:  BP (!) 144/92 (BP Location: Left Arm, Patient Position: Sitting, Cuff Size: Large)   Pulse 80   Temp 98.5 F (36.9 C) (Oral)   Resp 16   Ht 5\' 4"  (1.626 m)   Wt 228 lb 2 oz (103.5 kg)   SpO2 96%   BMI 39.16 kg/m   BP Readings from Last 3 Encounters:  12/03/15 (!) 144/92  09/07/15 (!) 142/108  11/07/13 (!) 138/92    Wt Readings from Last 3 Encounters:  12/03/15 228 lb 2 oz (103.5 kg)  09/07/15 238 lb (108 kg)  11/07/13 228 lb 1.9 oz (103.5 kg)    Physical Exam  Constitutional: She is oriented to person, place, and time.  Non-toxic appearance. She does not have a sickly appearance. She does not appear ill. No distress.  HENT:  Head: Normocephalic and atraumatic.  Mouth/Throat: Oropharynx is clear and moist. No oropharyngeal exudate.  Eyes: Conjunctivae are  normal. Right eye exhibits no discharge. Left eye exhibits no discharge. No scleral icterus.  Neck: Normal range of motion. Neck supple. No JVD present. No tracheal deviation present. No thyromegaly present.  Cardiovascular: Normal rate, regular rhythm, normal heart sounds and intact distal pulses.  Exam reveals no gallop and no friction rub.   No murmur heard. Pulmonary/Chest: Effort normal and breath sounds normal. No stridor. No respiratory distress. She has no wheezes. She has no rales. She exhibits no  tenderness.  Abdominal: Soft. Bowel sounds are normal. She exhibits no distension and no mass. There is no tenderness. There is no rebound and no guarding.    Musculoskeletal: Normal range of motion. She exhibits no edema, tenderness or deformity.  Lymphadenopathy:    She has no cervical adenopathy.  Neurological: She is oriented to person, place, and time.  Skin: Skin is warm and dry. No rash noted. She is not diaphoretic. No erythema. No pallor.  Psychiatric: She has a normal mood and affect. Her behavior is normal. Judgment and thought content normal.  Vitals reviewed.   Lab Results  Component Value Date   WBC 4.7 11/28/2015   HGB 13.2 11/28/2015   HCT 37.6 11/28/2015   PLT 156 11/28/2015   GLUCOSE 93 12/03/2015   CHOL 206 (H) 09/07/2015   TRIG 128.0 09/07/2015   HDL 55.30 09/07/2015   LDLDIRECT 144.3 12/29/2008   LDLCALC 125 (H) 09/07/2015   ALT 21 11/28/2015   AST 25 11/28/2015   NA 138 12/03/2015   K 3.9 12/03/2015   CL 100 12/03/2015   CREATININE 0.61 12/03/2015   BUN 14 12/03/2015   CO2 29 12/03/2015   TSH 1.81 12/29/2008   HGBA1C 5.5 12/03/2015    No results found.  Assessment & Plan:   Shelby Payne was seen today for hypertension and wound check.  Diagnoses and all orders for this visit:  Essential hypertension- her lytes and renal fxn are normal now, she has not tolerated the HCTZ due to low sodium, will d'c the HCTZ and will control the BP with an ARB/CCB combo -     Basic metabolic panel; Future -     Cortisol; Future -     Telmisartan-Amlodipine 40-5 MG TABS; Take 1 tablet by mouth daily.  Hyperglycemia- her blood sugar is normal now -     Basic metabolic panel; Future -     Cortisol; Future -     Hemoglobin A1c; Future  Cellulitis and abscess of trunk- she has had an allergic reaction to bactrim so I have listed it as an allergy, the infection has resolved, she will let me know if she develops any signs that the infection is returning   I have  discontinued Shelby Payne's hydrochlorothiazide, enalapril, and sulfamethoxazole-trimethoprim. I am also having her start on Telmisartan-Amlodipine. Additionally, I am having her maintain her triamcinolone cream, metoprolol succinate, and sertraline.  Meds ordered this encounter  Medications  . DISCONTD: sulfamethoxazole-trimethoprim (BACTRIM DS,SEPTRA DS) 800-160 MG tablet    Sig: Take 1 tablet by mouth 2 (two) times daily.  . Telmisartan-Amlodipine 40-5 MG TABS    Sig: Take 1 tablet by mouth daily.    Dispense:  90 tablet    Refill:  1     Follow-up: Return in about 2 months (around 02/02/2016).  Scarlette Calico, MD

## 2016-05-24 ENCOUNTER — Other Ambulatory Visit: Payer: Self-pay | Admitting: Internal Medicine

## 2016-05-24 DIAGNOSIS — I10 Essential (primary) hypertension: Secondary | ICD-10-CM

## 2016-08-19 ENCOUNTER — Other Ambulatory Visit: Payer: Self-pay | Admitting: Internal Medicine

## 2016-10-05 ENCOUNTER — Other Ambulatory Visit: Payer: Self-pay | Admitting: Family

## 2016-10-07 ENCOUNTER — Other Ambulatory Visit: Payer: Self-pay | Admitting: Internal Medicine

## 2016-10-08 ENCOUNTER — Other Ambulatory Visit: Payer: Self-pay | Admitting: Internal Medicine

## 2016-11-03 ENCOUNTER — Other Ambulatory Visit: Payer: Self-pay | Admitting: Family

## 2016-11-07 ENCOUNTER — Other Ambulatory Visit: Payer: Self-pay

## 2016-11-07 MED ORDER — METOPROLOL SUCCINATE ER 100 MG PO TB24: ORAL_TABLET | ORAL | 0 refills | Status: DC

## 2016-11-11 ENCOUNTER — Other Ambulatory Visit: Payer: Self-pay | Admitting: Internal Medicine

## 2016-11-27 ENCOUNTER — Other Ambulatory Visit: Payer: Self-pay | Admitting: Internal Medicine

## 2016-12-08 DIAGNOSIS — C801 Malignant (primary) neoplasm, unspecified: Secondary | ICD-10-CM

## 2016-12-08 HISTORY — DX: Malignant (primary) neoplasm, unspecified: C80.1

## 2016-12-19 ENCOUNTER — Encounter: Payer: Self-pay | Admitting: Internal Medicine

## 2016-12-19 ENCOUNTER — Ambulatory Visit: Payer: Self-pay | Admitting: Internal Medicine

## 2016-12-19 ENCOUNTER — Ambulatory Visit: Payer: BC Managed Care – PPO | Admitting: Internal Medicine

## 2016-12-19 ENCOUNTER — Other Ambulatory Visit (INDEPENDENT_AMBULATORY_CARE_PROVIDER_SITE_OTHER): Payer: BC Managed Care – PPO

## 2016-12-19 VITALS — BP 130/80 | HR 68 | Temp 98.2°F | Ht 64.0 in | Wt 221.0 lb

## 2016-12-19 DIAGNOSIS — F320 Major depressive disorder, single episode, mild: Secondary | ICD-10-CM

## 2016-12-19 DIAGNOSIS — I10 Essential (primary) hypertension: Secondary | ICD-10-CM | POA: Diagnosis not present

## 2016-12-19 DIAGNOSIS — E669 Obesity, unspecified: Secondary | ICD-10-CM

## 2016-12-19 DIAGNOSIS — R739 Hyperglycemia, unspecified: Secondary | ICD-10-CM | POA: Diagnosis not present

## 2016-12-19 DIAGNOSIS — Z23 Encounter for immunization: Secondary | ICD-10-CM | POA: Diagnosis not present

## 2016-12-19 DIAGNOSIS — Z0000 Encounter for general adult medical examination without abnormal findings: Secondary | ICD-10-CM

## 2016-12-19 DIAGNOSIS — E785 Hyperlipidemia, unspecified: Secondary | ICD-10-CM

## 2016-12-19 LAB — COMPREHENSIVE METABOLIC PANEL
ALT: 23 U/L (ref 0–35)
AST: 15 U/L (ref 0–37)
Albumin: 4.4 g/dL (ref 3.5–5.2)
Alkaline Phosphatase: 46 U/L (ref 39–117)
BUN: 23 mg/dL (ref 6–23)
CALCIUM: 9.8 mg/dL (ref 8.4–10.5)
CO2: 27 meq/L (ref 19–32)
Chloride: 102 mEq/L (ref 96–112)
Creatinine, Ser: 0.63 mg/dL (ref 0.40–1.20)
GFR: 102.36 mL/min (ref >60.00)
GLUCOSE: 93 mg/dL (ref 70–99)
POTASSIUM: 4.1 meq/L (ref 3.5–5.1)
Sodium: 138 mEq/L (ref 135–145)
Total Bilirubin: 0.3 mg/dL (ref 0.2–1.2)
Total Protein: 7.8 g/dL (ref 6.0–8.3)

## 2016-12-19 LAB — LIPID PANEL
CHOL/HDL RATIO: 3
Cholesterol: 217 mg/dL — ABNORMAL HIGH (ref 0–200)
HDL: 62.8 mg/dL (ref >39.00)
LDL CALC: 136 mg/dL — AB (ref 0–99)
NONHDL: 153.98
Triglycerides: 90 mg/dL (ref 0.0–149.0)
VLDL: 18 mg/dL (ref 0.0–40.0)

## 2016-12-19 LAB — CBC
HCT: 40.9 % (ref 36.0–46.0)
HEMOGLOBIN: 13.5 g/dL (ref 12.0–15.0)
MCHC: 33.1 g/dL (ref 30.0–36.0)
MCV: 89.5 fl (ref 78.0–100.0)
PLATELETS: 267 10*3/uL (ref 150.0–400.0)
RBC: 4.57 Mil/uL (ref 3.87–5.11)
RDW: 13.3 % (ref 11.5–15.5)
WBC: 10.7 10*3/uL — ABNORMAL HIGH (ref 4.0–10.5)

## 2016-12-19 LAB — HEMOGLOBIN A1C: HEMOGLOBIN A1C: 5.5 % (ref 4.6–6.5)

## 2016-12-19 MED ORDER — TRIAMCINOLONE ACETONIDE 0.1 % EX CREA: 1.0000 "application " | TOPICAL_CREAM | Freq: Two times a day (BID) | CUTANEOUS | 6 refills | Status: DC

## 2016-12-19 MED ORDER — SERTRALINE HCL 100 MG PO TABS: 100.0000 mg | ORAL_TABLET | Freq: Every day | ORAL | 3 refills | Status: DC

## 2016-12-19 MED ORDER — METOPROLOL SUCCINATE ER 100 MG PO TB24
100.0000 mg | ORAL_TABLET | Freq: Every day | ORAL | 3 refills | Status: DC
Start: 2016-12-19 — End: 2017-12-16

## 2016-12-19 MED ORDER — TELMISARTAN-AMLODIPINE 40-5 MG PO TABS: 1.0000 | ORAL_TABLET | Freq: Every day | ORAL | 3 refills | Status: DC

## 2016-12-19 NOTE — Progress Notes (Signed)
   Subjective:    Patient ID: Shelby Payne, female    DOB: 1955-02-10, 61 y.o.   MRN: [id number]  HPI The patient is a 61 YO female coming in for wellness. No new concerns.  PMH, [facility], social history reviewed and updated  Review of Systems  Constitutional: Negative.   HENT: Negative.   Eyes: Negative.   Respiratory: Negative for cough, chest tightness and shortness of breath.   Cardiovascular: Negative for chest pain, palpitations and leg swelling.  Gastrointestinal: Negative for abdominal distention, abdominal pain, constipation, diarrhea, nausea and vomiting.  Musculoskeletal: Negative.   Skin: Negative.   Neurological: Negative.   Psychiatric/Behavioral: Negative.      Objective:   Physical Exam  Constitutional: She is oriented to person, place, and time. She appears well-developed and well-nourished.  HENT:  Head: Normocephalic and atraumatic.  Eyes: EOM are normal.  Neck: Normal range of motion.  Cardiovascular: Normal rate and regular rhythm.  Pulmonary/Chest: Effort normal and breath sounds normal. No respiratory distress. She has no wheezes. She has no rales.  Abdominal: Soft. Bowel sounds are normal. She exhibits no distension. There is no tenderness. There is no rebound.  Musculoskeletal: She exhibits no edema.  Neurological: She is alert and oriented to person, place, and time. Coordination normal.  Skin: Skin is warm and dry.  Psychiatric: She has a normal mood and affect.   Vitals:   12/19/16 1558  BP: 130/80  Pulse: 68  Temp: 98.2 F (36.8 C)  TempSrc: Oral  SpO2: 99%  Weight: 221 lb (100.2 kg)  Height: 5\' 4"  (1.626 m)      Assessment & Plan:  Flu shot given at visit

## 2016-12-19 NOTE — Patient Instructions (Signed)
We will check the labs today and have given you the flu shot.  Health Maintenance, Female Adopting a healthy lifestyle and getting preventive care can go a long way to promote health and wellness. Talk with your health care provider about what schedule of regular examinations is right for you. This is a good chance for you to check in with your provider about disease prevention and staying healthy. In between checkups, there are plenty of things you can do on your own. Experts have done a lot of research about which lifestyle changes and preventive measures are most likely to keep you healthy. Ask your health care provider for more information. Weight and diet Eat a healthy diet  Be sure to include plenty of vegetables, fruits, low-fat dairy products, and lean protein.  Do not eat a lot of foods high in solid fats, added sugars, or salt.  Get regular exercise. This is one of the most important things you can do for your health. ? Most adults should exercise for at least 150 minutes each week. The exercise should increase your heart rate and make you sweat (moderate-intensity exercise). ? Most adults should also do strengthening exercises at least twice a week. This is in addition to the moderate-intensity exercise.  Maintain a healthy weight  Body mass index (BMI) is a measurement that can be used to identify possible weight problems. It estimates body fat based on height and weight. Your health care provider can help determine your BMI and help you achieve or maintain a healthy weight.  For females 7 years of age and older: ? A BMI below 18.5 is considered underweight. ? A BMI of 18.5 to 24.9 is normal. ? A BMI of 25 to 29.9 is considered overweight. ? A BMI of 30 and above is considered obese.  Watch levels of cholesterol and blood lipids  You should start having your blood tested for lipids and cholesterol at 60 years of age, then have this test every 5 years.  You may need to have  your cholesterol levels checked more often if: ? Your lipid or cholesterol levels are high. ? You are older than 60 years of age. ? You are at high risk for heart disease.  Cancer screening Lung Cancer  Lung cancer screening is recommended for adults 40-48 years old who are at high risk for lung cancer because of a history of smoking.  A yearly low-dose CT scan of the lungs is recommended for people who: ? Currently smoke. ? Have quit within the past 15 years. ? Have at least a 30-pack-year history of smoking. A pack year is smoking an average of one pack of cigarettes a day for 1 year.  Yearly screening should continue until it has been 15 years since you quit.  Yearly screening should stop if you develop a health problem that would prevent you from having lung cancer treatment.  Breast Cancer  Practice breast self-awareness. This means understanding how your breasts normally appear and feel.  It also means doing regular breast self-exams. Let your health care provider know about any changes, no matter how small.  If you are in your 20s or 30s, you should have a clinical breast exam (CBE) by a health care provider every 1-3 years as part of a regular health exam.  If you are 73 or older, have a CBE every year. Also consider having a breast X-ray (mammogram) every year.  If you have a family history of breast cancer, talk to  your health care provider about genetic screening.  If you are at high risk for breast cancer, talk to your health care provider about having an MRI and a mammogram every year.  Breast cancer gene (BRCA) assessment is recommended for women who have family members with BRCA-related cancers. BRCA-related cancers include: ? Breast. ? Ovarian. ? Tubal. ? Peritoneal cancers.  Results of the assessment will determine the need for genetic counseling and BRCA1 and BRCA2 testing.  Cervical Cancer Your health care provider may recommend that you be screened  regularly for cancer of the pelvic organs (ovaries, uterus, and vagina). This screening involves a pelvic examination, including checking for microscopic changes to the surface of your cervix (Pap test). You may be encouraged to have this screening done every 3 years, beginning at age 80.  For women ages 9-65, health care providers may recommend pelvic exams and Pap testing every 3 years, or they may recommend the Pap and pelvic exam, combined with testing for human papilloma virus (HPV), every 5 years. Some types of HPV increase your risk of cervical cancer. Testing for HPV may also be done on women of any age with unclear Pap test results.  Other health care providers may not recommend any screening for nonpregnant women who are considered low risk for pelvic cancer and who do not have symptoms. Ask your health care provider if a screening pelvic exam is right for you.  If you have had past treatment for cervical cancer or a condition that could lead to cancer, you need Pap tests and screening for cancer for at least 20 years after your treatment. If Pap tests have been discontinued, your risk factors (such as having a new sexual partner) need to be reassessed to determine if screening should resume. Some women have medical problems that increase the chance of getting cervical cancer. In these cases, your health care provider may recommend more frequent screening and Pap tests.  Colorectal Cancer  This type of cancer can be detected and often prevented.  Routine colorectal cancer screening usually begins at 60 years of age and continues through 60 years of age.  Your health care provider may recommend screening at an earlier age if you have risk factors for colon cancer.  Your health care provider may also recommend using home test kits to check for hidden blood in the stool.  A small camera at the end of a tube can be used to examine your colon directly (sigmoidoscopy or colonoscopy). This is  done to check for the earliest forms of colorectal cancer.  Routine screening usually begins at age 45.  Direct examination of the colon should be repeated every 5-10 years through 60 years of age. However, you may need to be screened more often if early forms of precancerous polyps or small growths are found.  Skin Cancer  Check your skin from head to toe regularly.  Tell your health care provider about any new moles or changes in moles, especially if there is a change in a mole's shape or color.  Also tell your health care provider if you have a mole that is larger than the size of a pencil eraser.  Always use sunscreen. Apply sunscreen liberally and repeatedly throughout the day.  Protect yourself by wearing long sleeves, pants, a wide-brimmed hat, and sunglasses whenever you are outside.  Heart disease, diabetes, and high blood pressure  High blood pressure causes heart disease and increases the risk of stroke. High blood pressure is more likely  to develop in: ? People who have blood pressure in the high end of the normal range (130-139/85-89 mm Hg). ? People who are overweight or obese. ? People who are African American.  If you are 41-24 years of age, have your blood pressure checked every 3-5 years. If you are 31 years of age or older, have your blood pressure checked every year. You should have your blood pressure measured twice-once when you are at a hospital or clinic, and once when you are not at a hospital or clinic. Record the average of the two measurements. To check your blood pressure when you are not at a hospital or clinic, you can use: ? An automated blood pressure machine at a pharmacy. ? A home blood pressure monitor.  If you are between 45 years and 72 years old, ask your health care provider if you should take aspirin to prevent strokes.  Have regular diabetes screenings. This involves taking a blood sample to check your fasting blood sugar level. ? If you are  at a normal weight and have a low risk for diabetes, have this test once every three years after 60 years of age. ? If you are overweight and have a high risk for diabetes, consider being tested at a younger age or more often. Preventing infection Hepatitis B  If you have a higher risk for hepatitis B, you should be screened for this virus. You are considered at high risk for hepatitis B if: ? You were born in a country where hepatitis B is common. Ask your health care provider which countries are considered high risk. ? Your parents were born in a high-risk country, and you have not been immunized against hepatitis B (hepatitis B vaccine). ? You have HIV or AIDS. ? You use needles to inject street drugs. ? You live with someone who has hepatitis B. ? You have had sex with someone who has hepatitis B. ? You get hemodialysis treatment. ? You take certain medicines for conditions, including cancer, organ transplantation, and autoimmune conditions.  Hepatitis C  Blood testing is recommended for: ? Everyone born from [birth year] through 1964. ? Anyone with known risk factors for hepatitis C.  Sexually transmitted infections (STIs)  You should be screened for sexually transmitted infections (STIs) including gonorrhea and chlamydia if: ? You are sexually active and are younger than 60 years of age. ? You are older than 60 years of age and your health care provider tells you that you are at risk for this type of infection. ? Your sexual activity has changed since you were last screened and you are at an increased risk for chlamydia or gonorrhea. Ask your health care provider if you are at risk.  If you do not have HIV, but are at risk, it may be recommended that you take a prescription medicine daily to prevent HIV infection. This is called pre-exposure prophylaxis (PrEP). You are considered at risk if: ? You are sexually active and do not regularly use condoms or know the HIV status of your  partner(s). ? You take drugs by injection. ? You are sexually active with a partner who has HIV.  Talk with your health care provider about whether you are at high risk of being infected with HIV. If you choose to begin PrEP, you should first be tested for HIV. You should then be tested every 3 months for as long as you are taking PrEP. Pregnancy  If you are premenopausal and you may become  pregnant, ask your health care provider about preconception counseling.  If you may become pregnant, take 400 to 800 micrograms (mcg) of folic acid every day.  If you want to prevent pregnancy, talk to your health care provider about birth control (contraception). Osteoporosis and menopause  Osteoporosis is a disease in which the bones lose minerals and strength with aging. This can result in serious bone fractures. Your risk for osteoporosis can be identified using a bone density scan.  If you are 72 years of age or older, or if you are at risk for osteoporosis and fractures, ask your health care provider if you should be screened.  Ask your health care provider whether you should take a calcium or vitamin D supplement to lower your risk for osteoporosis.  Menopause may have certain physical symptoms and risks.  Hormone replacement therapy may reduce some of these symptoms and risks. Talk to your health care provider about whether hormone replacement therapy is right for you. Follow these instructions at home:  Schedule regular health, dental, and eye exams.  Stay current with your immunizations.  Do not use any tobacco products including cigarettes, chewing tobacco, or electronic cigarettes.  If you are pregnant, do not drink alcohol.  If you are breastfeeding, limit how much and how often you drink alcohol.  Limit alcohol intake to no more than 1 drink per day for nonpregnant women. One drink equals 12 ounces of beer, 5 ounces of wine, or 1 ounces of hard liquor.  Do not use street  drugs.  Do not share needles.  Ask your health care provider for help if you need support or information about quitting drugs.  Tell your health care provider if you often feel depressed.  Tell your health care provider if you have ever been abused or do not feel safe at home. This information is not intended to replace advice given to you by your health care provider. Make sure you discuss any questions you have with your health care provider. Document Released: 08/09/2010 Document Revised: 07/02/2015 Document Reviewed: 10/28/2014 Elsevier Interactive Patient Education  [employer].

## 2016-12-20 DIAGNOSIS — Z23 Encounter for immunization: Secondary | ICD-10-CM

## 2016-12-20 NOTE — Assessment & Plan Note (Signed)
Flu shot given at visit, tetanus up to date. Counseled about shingrix. Colonoscopy up to date. Counseled about sun safety and mole surveillance. Mammogram with gyn but not up to date and pap but she is going in the next several weeks. Given screening recommendations.

## 2016-12-20 NOTE — Assessment & Plan Note (Signed)
Controlled by zoloft 100 mg daily and will continue.

## 2016-12-20 NOTE — Assessment & Plan Note (Signed)
BP at goal on metoprolol and telmisartan/amlodipine. Checking CMP and adjust as needed.

## 2016-12-20 NOTE — Assessment & Plan Note (Signed)
BMI 37 and complicated by hyperlipidemia and hypertension.

## 2016-12-20 NOTE — Assessment & Plan Note (Signed)
Not on meds, checking lipid panel.  

## 2016-12-20 NOTE — Assessment & Plan Note (Signed)
Checking HgA1c and adjust as needed.  

## 2016-12-21 ENCOUNTER — Telehealth: Payer: Self-pay

## 2016-12-21 NOTE — Telephone Encounter (Signed)
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 Mailing today

## 2017-01-09 LAB — HM MAMMOGRAPHY: HM Mammogram: ABNORMAL — AB (ref 0–4)

## 2017-01-11 ENCOUNTER — Other Ambulatory Visit: Payer: Self-pay | Admitting: Radiology

## 2017-01-11 ENCOUNTER — Encounter: Payer: Self-pay | Admitting: Internal Medicine

## 2017-01-11 NOTE — Progress Notes (Signed)
Abstracted and sent to scan  

## 2017-01-17 ENCOUNTER — Other Ambulatory Visit: Payer: Self-pay | Admitting: *Deleted

## 2017-01-17 DIAGNOSIS — C50311 Malignant neoplasm of lower-inner quadrant of right female breast: Secondary | ICD-10-CM

## 2017-01-17 DIAGNOSIS — Z170 Estrogen receptor positive status [ER+]: Principal | ICD-10-CM

## 2017-01-18 ENCOUNTER — Ambulatory Visit
Admission: RE | Admit: 2017-01-18 | Discharge: 2017-01-18 | Disposition: A | Discharge status: Home / Self Care | Payer: BC Managed Care – PPO | Source: Ambulatory Visit | Attending: Radiation Oncology | Admitting: Radiation Oncology

## 2017-01-18 ENCOUNTER — Encounter: Payer: Self-pay | Admitting: Radiation Oncology

## 2017-01-18 ENCOUNTER — Ambulatory Visit: Payer: Self-pay | Admitting: Surgery

## 2017-01-18 ENCOUNTER — Ambulatory Visit (HOSPITAL_BASED_OUTPATIENT_CLINIC_OR_DEPARTMENT_OTHER): Payer: BC Managed Care – PPO | Admitting: Oncology

## 2017-01-18 ENCOUNTER — Other Ambulatory Visit (HOSPITAL_BASED_OUTPATIENT_CLINIC_OR_DEPARTMENT_OTHER): Payer: BC Managed Care – PPO

## 2017-01-18 ENCOUNTER — Encounter: Payer: Self-pay | Admitting: General Practice

## 2017-01-18 ENCOUNTER — Encounter: Payer: Self-pay | Admitting: Oncology

## 2017-01-18 DIAGNOSIS — Z170 Estrogen receptor positive status [ER+]: Principal | ICD-10-CM

## 2017-01-18 DIAGNOSIS — I10 Essential (primary) hypertension: Secondary | ICD-10-CM | POA: Diagnosis not present

## 2017-01-18 DIAGNOSIS — C50311 Malignant neoplasm of lower-inner quadrant of right female breast: Secondary | ICD-10-CM

## 2017-01-18 DIAGNOSIS — C50911 Malignant neoplasm of unspecified site of right female breast: Secondary | ICD-10-CM

## 2017-01-18 LAB — CBC WITH DIFFERENTIAL/PLATELET
BASO%: 0.9 % (ref 0.0–2.0)
Basophils Absolute: 0.1 10*3/uL (ref 0.0–0.1)
EOS%: 2.1 % (ref 0.0–7.0)
Eosinophils Absolute: 0.2 10*3/uL (ref 0.0–0.5)
HEMATOCRIT: 40.9 % (ref 34.8–46.6)
HEMOGLOBIN: 13.8 g/dL (ref 11.6–15.9)
LYMPH#: 2.8 10*3/uL (ref 0.9–3.3)
LYMPH%: 26.6 % (ref 14.0–49.7)
MCH: 29.5 pg (ref 25.1–34.0)
MCHC: 33.8 g/dL (ref 31.5–36.0)
MCV: 87.2 fL (ref 79.5–101.0)
MONO#: 0.6 10*3/uL (ref 0.1–0.9)
MONO%: 6.1 % (ref 0.0–14.0)
NEUT%: 64.3 % (ref 38.4–76.8)
NEUTROS ABS: 6.7 10*3/uL — AB (ref 1.5–6.5)
PLATELETS: 232 10*3/uL (ref 145–400)
RBC: 4.69 10*6/uL (ref 3.70–5.45)
RDW: 13.2 % (ref 11.2–14.5)
WBC: 10.4 10*3/uL — AB (ref 3.9–10.3)

## 2017-01-18 LAB — COMPREHENSIVE METABOLIC PANEL
ALBUMIN: 4.3 g/dL (ref 3.5–5.0)
ALK PHOS: 53 U/L (ref 40–150)
ALT: 24 U/L (ref 0–55)
ANION GAP: 13 meq/L — AB (ref 3–11)
AST: 19 U/L (ref 5–34)
BILIRUBIN TOTAL: 0.39 mg/dL (ref 0.20–1.20)
BUN: 19.5 mg/dL (ref 7.0–26.0)
CALCIUM: 9.8 mg/dL (ref 8.4–10.4)
CO2: 22 mEq/L (ref 22–29)
Chloride: 102 mEq/L (ref 98–109)
Creatinine: 0.7 mg/dL (ref 0.6–1.1)
GLUCOSE: 91 mg/dL (ref 70–140)
POTASSIUM: 4.1 meq/L (ref 3.5–5.1)
SODIUM: 137 meq/L (ref 136–145)
TOTAL PROTEIN: 7.9 g/dL (ref 6.4–8.3)

## 2017-01-18 NOTE — Progress Notes (Signed)
[facility] Psychosocial Distress Screening Spiritual Care  Met with Shelby Payne and daughter Shelby Payne in [facility] to introduce Support Center team/resources, reviewing distress screen per protocol.  The patient scored a 6 on the Psychosocial Distress Thermometer which indicates moderate distress. Also assessed for distress and other psychosocial needs.   ONCBCN DISTRESS SCREENING 01/18/2017  Screening Type Initial Screening  Distress experienced in past week (1-10) 6  Practical problem type Work/school  Emotional problem type Adjusting to illness  Information Concerns Type Lack of info about treatment  Physical Problem type Sleep/insomnia;Tingling hands/feet  Referral to support programs Yes   Per pt, she is feeling less stressed now that her information concerns are resolved and she has a treatment plan. Shelby Payne reports good support from dtr (an NP who works at [employer] some days each week), husband, and a couple close friends. 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  Provided empathic listening, normalization of feelings, and introduction to Support Center team/resources.  Follow up needed: No. Per pt, no other needs at this time, but please page if immediate needs arise. Thank you.   Chaplain Lisa Lundeen, MDiv, [facility] Pager [phone] Voicemail [phone]      

## 2017-01-18 NOTE — Progress Notes (Signed)
[place] Psychosocial Distress Screening Clinical Social Work  Clinical Social Work was referred by distress screening protocol.  The patient scored a 6 on the Psychosocial Distress Thermometer which indicates moderate distress. Clinical Social Worker Edwyna Shell to assess for distress and other psychosocial needs. CSW and patient discussed common feeling and emotions when being diagnosed with cancer, and the importance of support during treatment. CSW informed patient of the support team and support services at [facility]. CSW provided contact information and encouraged patient to call with any questions or concerns.  Reports sleep is going better, appreciated meeting w team today and getting plan of care in place.    ONCBCN DISTRESS SCREENING 01/18/2017  Screening Type Initial Screening  Distress experienced in past week (1-10) 6  Practical problem type Work/school  Emotional problem type Adjusting to illness  Information Concerns Type Lack of info about treatment  Physical Problem type Sleep/insomnia;Tingling hands/feet  Referral to support programs Yes   Clinical Social Worker follow up needed: No.  If yes, follow up plan:

## 2017-01-18 NOTE — H&P (View-Only) (Signed)
Shelby Payne 01/18/2017 7:54 AM Location: [facility] Patient #: [id number] DOB: 11/04/1955 Undefined / Language: Shelby Payne / Race: White Female  History of Present Illness Shelby Payne A. Shelby Deguire MD; 01/18/2017 2:00 PM) Patient words: Patient sent at the request of Dr. Marcelo Payne for mammographic abnormality noted on screening mammogram. Patient had area of calcifications and mass lesion measuring 1.7 cm in the right breast lower inner quadrant. 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 The patient denies any history of breast pain, breast mass or nipple discharge. Her mother had breast cancer in her 16s. She is sore from the biopsy.  The patient is a 61 year old female.   Past Surgical History Shelby Pummel, RN; 01/18/2017 7:54 AM) Oral Surgery  Diagnostic Studies History Shelby Pummel, RN; 01/18/2017 7:54 AM) Colonoscopy 1-5 years ago Mammogram within last year  Medication History Shelby Pummel, RN; 01/18/2017 7:55 AM) Medications Reconciled  Social History Shelby Pummel, RN; 01/18/2017 7:54 AM) Alcohol use Occasional alcohol use. Caffeine use Carbonated beverages, Coffee, Tea. No drug use Tobacco use Never smoker.  Family History Shelby Pummel, RN; 01/18/2017 7:54 AM) Arthritis Mother. Breast Cancer Mother. Cancer Sister. Cerebrovascular Accident Father. Hypertension Father, Mother. Prostate Cancer Father. Thyroid problems Mother.  Pregnancy / Birth History Shelby Pummel, RN; 01/18/2017 7:54 AM) Shelby Payne 2 Para 2  Other Problems Shelby Pummel, RN; 01/18/2017 7:54 AM) Anxiety Disorder Depression High blood pressure     Review of Systems Sunday Spillers Ledford RN; 01/18/2017 7:54 AM) General Not Present- Appetite Loss, Chills, Fatigue, Fever, Night Sweats, Weight Gain and Weight Loss. Skin Present- Rash. Not Present- Change in Wart/Mole, Dryness, Hives, Jaundice, New  Lesions, Non-Healing Wounds and Ulcer. HEENT Present- Ringing in the Ears, Seasonal Allergies and Wears glasses/contact lenses. Not Present- Earache, Hearing Loss, Hoarseness, Nose Bleed, Oral Ulcers, Sinus Pain, Sore Throat, Visual Disturbances and Yellow Eyes. Respiratory Not Present- Bloody sputum, Chronic Cough, Difficulty Breathing, Snoring and Wheezing. Breast Not Present- Breast Mass, Breast Pain, Nipple Discharge and Skin Changes. Cardiovascular Not Present- Chest Pain, Difficulty Breathing Lying Down, Leg Cramps, Palpitations, Rapid Heart Rate, Shortness of Breath and Swelling of Extremities. Gastrointestinal Present- Hemorrhoids. Not Present- Abdominal Pain, Bloating, Bloody Stool, Change in Bowel Habits, Chronic diarrhea, Constipation, Difficulty Swallowing, Excessive gas, Gets full quickly at meals, Indigestion, Nausea, Rectal Pain and Vomiting. Female Genitourinary Not Present- Frequency, Nocturia, Painful Urination, Pelvic Pain and Urgency. Musculoskeletal Present- Joint Pain. Not Present- Back Pain, Joint Stiffness, Muscle Pain, Muscle Weakness and Swelling of Extremities. Neurological Present- Numbness and Tingling. Not Present- Decreased Memory, Fainting, Headaches, Seizures, Tremor, Trouble walking and Weakness. Psychiatric Present- Anxiety and Depression. Not Present- Bipolar, Change in Sleep Pattern, Fearful and Frequent crying. Endocrine Not Present- Cold Intolerance, Excessive Hunger, Hair Changes, Heat Intolerance, Hot flashes and New Diabetes. Hematology Not Present- Blood Thinners, Easy Bruising, Excessive bleeding, Gland problems, HIV and Persistent Infections.   Physical Exam ([name] MD; 01/18/2017 2:01 PM)  General Mental Status-Alert. General Appearance-Consistent with stated Payne. Hydration-Well hydrated. Voice-Normal.  Head and Neck Head-normocephalic, atraumatic with no lesions or palpable masses. Trachea-midline. Thyroid Gland  Characteristics - normal size and consistency.  Chest and Lung Exam Chest and lung exam reveals -quiet, even and easy respiratory effort with no use of accessory muscles and on auscultation, normal breath sounds, no adventitious sounds and normal vocal resonance. Inspection Chest Wall - Normal. Back - normal.  Breast Breast - Left-Symmetric, Non Tender, No Biopsy scars, no Dimpling, No Inflammation, No Lumpectomy  scars, No Mastectomy scars, No Peau d' Orange. Breast - Right-Symmetric, Non Tender, No Biopsy scars, no Dimpling, No Inflammation, No Lumpectomy scars, No Mastectomy scars, No Peau d' Orange. Breast Lump-No Palpable Breast Mass. Note: steristrips noted at biopsy site  Cardiovascular Cardiovascular examination reveals -normal heart sounds, regular rate and rhythm with no murmurs and normal pedal pulses bilaterally.  Neurologic Neurologic evaluation reveals -alert and oriented x 3 with no impairment of recent or remote memory. Mental Status-Normal.  Lymphatic Head & Neck  General Head & Neck Lymphatics: Bilateral - Description - Normal. Axillary  General Axillary Region: Bilateral - Description - Normal. Tenderness - Non Tender.    Assessment & Plan ([name] MD; 01/18/2017 2:07 PM)  BREAST CANCER, RIGHT (C50.911) Impression: Discussed options of breast conservation versus mastectomy and reconstruction. The patient has opted for right breast seed localized partial mastectomy and right axillary sentinel lymph node mapping. Risk of lumpectomy include bleeding, infection, seroma, more surgery, use of seed/wire, wound care, cosmetic deformity and the need for other treatments, death , blood clots, death. Pt agrees to proceed. Risk of sentinel lymph node mapping include bleeding, infection, lymphedema, shoulder pain. stiffness, dye allergy. cosmetic deformity , blood clots, death, need for more surgery. Pt agres to proceed.  Current Plans You are being  scheduled for surgery- Our schedulers will call you.  You should hear from our office's scheduling department within 5 working days about the location, date, and time of surgery. We try to make accommodations for patient's preferences in scheduling surgery, but sometimes the OR schedule or the surgeon's schedule prevents [name] from making those accommodations.  If you have not heard from our office [phone]) in 5 working days, call the office and ask for your surgeon's nurse.  If you have other questions about your diagnosis, plan, or surgery, call the office and ask for your surgeon's nurse.  We discussed the staging and pathophysiology of breast cancer. We discussed all of the different options for treatment for breast cancer including surgery, chemotherapy, radiation therapy, Herceptin, and antiestrogen therapy. We discussed a sentinel lymph node biopsy as she does not appear to having lymph node involvement right now. We discussed the performance of that with injection of radioactive tracer and blue dye. We discussed that she would have an incision underneath her axillary hairline. We discussed that there is a bout a 10-20% chance of having a positive node with a sentinel lymph node biopsy and we will await the permanent pathology to make any other first further decisions in terms of her treatment. One of these options might be to return to the operating room to perform an axillary lymph node dissection. We discussed about a 1-2% risk lifetime of chronic shoulder pain as well as lymphedema associated with a sentinel lymph node biopsy. We discussed the options for treatment of the breast cancer which included lumpectomy versus a mastectomy. We discussed the performance of the lumpectomy with a wire placement. We discussed a 10-20% chance of a positive margin requiring reexcision in the operating room. We also discussed that she may need radiation therapy or antiestrogen therapy or both if she  undergoes lumpectomy. We discussed the mastectomy and the postoperative care for that as well. We discussed that there is no difference in her survival whether she undergoes lumpectomy with radiation therapy or antiestrogen therapy versus a mastectomy. There is a slight difference in the local recurrence rate being 3-5% with lumpectomy and about 1% with a mastectomy. We discussed the risks  of operation including bleeding, infection, possible reoperation. She understands her further therapy will be based on what her stages at the time of her operation.  Pt Education - [facility] Breast Biopsy HCI: discussed with patient and provided information. Pt Education - breast cancer surgery: discussed with patient and provided information.

## 2017-01-18 NOTE — Addendum Note (Signed)
Encounter addended by: Eppie Gibson, MD on: 01/18/2017 6:14 PM  Actions taken: Sign clinical note

## 2017-01-18 NOTE — Progress Notes (Addendum)
Radiation Oncology         [phone]) [phone] ________________________________  Initial outpatient Consultation  Name: Shelby Payne MRN: [id number]  Date: 01/18/2017  DOB: Apr 19, 1955  FK:[id number], Real Cons, MD  Erroll Luna, MD   REFERRING PHYSICIAN: Erroll Luna, MD  DIAGNOSIS:    ICD-10-CM   1. Malignant neoplasm of lower-inner quadrant of right breast of female, estrogen receptor positive ([place]) C50.311    Z17.0     Stage IA [id number] Right Breast UIQ Invasive Ductal Carcinoma with DCIS, ER(+) / PR(-) / Her2(-), Grade II  CHIEF COMPLAINT: Here to discuss management of right breast cancer  HISTORY OF PRESENT ILLNESS::Shelby Payne is a 61 y.o. female who presented underwent routine screening mammography on 11/21/182 which showed a mass of indeterminate size within the right breast UIQ. She subsequently had repeat mammography with US of the right breast and axilla on 01/09/17 which confirmed the presence of a 1.7cm irregular mass within right breast and calcifications posterior to this. Axillary [name] inspection was deemed negative. Biopsy performed on 01/11/17 showed invasive ductal carcinoma with DCIS with characteristics as described above in the diagnosis. She now presents into the multidisciplinary breast clinic to discuss treatment options in the ongoing management of her right breast cancer.   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  She denies prior RT.  Her mother had breast cancer in her 70s, her sister an angiosarcoma of the neck in her 76s, a decade or so after radiation for hyperthyroidism (very possible cancer was caused by radiation, given this history).  Mother had radiation for the same thyroid condition in her 31s.  Pt wonders if she should undergo genetic testing.    ROS is positive for sleep loss, foot swelling, sleeps on 2 pillows, rectal bleeding once, left knee arthritis, tingling of left arm, anxiety.   PREVIOUS RADIATION THERAPY: No  PAST  MEDICAL HISTORY:  has a past medical history of Acute sinusitis, unspecified, Depression, Hypertension, Obesity, unspecified, and Pneumonia, organism unspecified(486).    PAST SURGICAL HISTORY: Past Surgical History:  Procedure Laterality Date  . WISDOM TOOTH EXTRACTION      FAMILY HISTORY: family history includes Breast cancer in her mother; Cancer in her sister; Diabetes in her father; Esophageal cancer in her maternal uncle; Gout in her mother; Heart disease in her father; Hyperlipidemia in her mother; Hypertension in her father and mother; Hypothyroidism in her father, mother, and sister.  SOCIAL HISTORY:  reports that  has never smoked. she has never used smokeless tobacco. She reports that she drinks alcohol. She reports that she does not use drugs.  ALLERGIES: Bactrim [sulfamethoxazole-trimethoprim] and Hctz [hydrochlorothiazide]  MEDICATIONS:  Current Outpatient Medications  Medication Sig Dispense Refill  . metoprolol succinate (TOPROL-XL) 100 MG 24 hr tablet Take 1 tablet (100 mg total) daily by mouth. 90 tablet 3  . sertraline (ZOLOFT) 100 MG tablet Take 1 tablet (100 mg total) daily by mouth. 90 tablet 3  . Telmisartan-Amlodipine 40-5 MG TABS Take 1 tablet daily by mouth. 90 tablet 3  . triamcinolone cream (KENALOG) 0.1 % Apply 1 application 2 (two) times daily topically. 30 g 6   No current facility-administered medications for this encounter.     REVIEW OF SYSTEMS: A 10+ POINT REVIEW OF SYSTEMS WAS OBTAINED including neurology, dermatology, psychiatry, cardiac, respiratory, lymph, extremities, GI, GU, Musculoskeletal, constitutional, breasts, reproductive, HEENT.  All pertinent positives are noted in the HPI.  All others are negative.   PHYSICAL EXAM:  Vitals with Age-Percentiles 01/18/2017  Length 161.0 cm  Systolic 960  Diastolic 80  Pulse 74  Respiration 18  Weight 99.474 kg  BMI 37.62  VISIT REPORT    General: Alert and oriented, in no acute distress HEENT:  Head is normocephalic. Extraocular movements are intact. Oropharynx is clear. Neck: Neck is supple, no palpable cervical or supraclavicular lymphadenopathy. Heart: Regular in rate and rhythm with no murmurs, rubs, or gallops. Chest: Clear to auscultation bilaterally, with no rhonchi, wheezes, or rales. Abdomen: Soft, nontender, nondistended, with no rigidity or guarding. Extremities: No cyanosis or edema. Lymphatics: see Neck Exam Skin: No concerning lesions. Musculoskeletal: symmetric strength and muscle tone throughout. Neurologic: Cranial nerves II through XII are grossly intact. No obvious focalities. Speech is fluent. Coordination is intact. Psychiatric: Judgment and insight are intact. Affect is appropriate. Breasts: LIQ right breast mass r/t biopsy effects at tumor site, about 2cm . No other palpable masses appreciated in the breasts or axillae .  ECOG = 0  0 - Asymptomatic (Fully active, able to carry on all predisease activities without restriction)  1 - Symptomatic but completely ambulatory (Restricted in physically strenuous activity but ambulatory and able to carry out work of a light or sedentary nature. For example, light housework, office work)  2 - Symptomatic, <50% in bed during the day (Ambulatory and capable of all self care but unable to carry out any work activities. Up and about more than 50% of waking hours)  3 - Symptomatic, >50% in bed, but not bedbound (Capable of only limited self-care, confined to bed or chair 50% or more of waking hours)  4 - Bedbound (Completely disabled. Cannot carry on any self-care. Totally confined to bed or chair)  5 - Death   Eustace Pen MM, Creech RH, Tormey DC, et al. [phone]). "Toxicity and response criteria of the [facility]". [place] Oncol. 5 (6): 649-55   LABORATORY DATA:  Lab Results  Component Value Date   WBC 10.4 (H) 01/18/2017   HGB 13.8 01/18/2017   HCT 40.9 01/18/2017   MCV 87.2 01/18/2017   PLT  232 01/18/2017   CMP     Component Value Date/Time   NA 137 01/18/2017 1238   K 4.1 01/18/2017 1238   CL 102 12/19/2016 1634   CO2 22 01/18/2017 1238   GLUCOSE 91 01/18/2017 1238   BUN 19.5 01/18/2017 1238   CREATININE 0.7 01/18/2017 1238   CALCIUM 9.8 01/18/2017 1238   PROT 7.9 01/18/2017 1238   ALBUMIN 4.3 01/18/2017 1238   AST 19 01/18/2017 1238   ALT 24 01/18/2017 1238   ALKPHOS 53 01/18/2017 1238   BILITOT 0.39 01/18/2017 1238   GFRNONAA >60 11/28/2015 1522   GFRAA >60 11/28/2015 1522    RADIOGRAPHY:  As above     IMPRESSION/PLAN: Right breast cancer    This is a wonderful 61 y.o. female who presents into the multidisciplinary breast clinic to discuss the potential role of radiotherapy in the ongoing management of her right breast cancer.   She has been discussed at our multidisciplinary tumor board.  The consensus is that she would be a good candidate for breast conservation. I talked to her about the option of a mastectomy and informed her that her expected overall survival would be equivalent between mastectomy and breast conservation, based upon randomized controlled data. She is enthusiastic about breast conservation.  I discussed her family history with genetics counselor today. She does not qualify for genetic testing via insurance;  risk of genetic mutation is relatively low.  It was a pleasure meeting the patient today. We discussed the risks, benefits, and side effects of radiotherapy. I recommend radiotherapy to the right breast to reduce her risk of locoregional recurrence by 2/3.  We discussed that radiation would take approximately 4 weeks to complete and that I would give the patient a few weeks to heal following surgery before starting treatment planning.  If chemotherapy were to be given, this would precede radiotherapy. We spoke about acute effects including skin irritation and fatigue as well as much less common late effects including internal organ injury or  irritation. We spoke about the latest technology that is used to minimize the risk of late effects for patients undergoing radiotherapy to the breast or chest wall. No guarantees of treatment were given. The patient is enthusiastic about proceeding with treatment. I look forward to participating in the patient's care.  I will await her referral back to me for postoperative follow-up and eventual CT simulation/treatment planning.     __________________________________________   Eppie Gibson, MD   This document serves as a record of services personally performed by Eppie Gibson, MD. It was created on her behalf by Reola Mosher, a trained medical scribe. The creation of this record is based on the scribe's personal observations and the provider's statements to them. This document has been checked and approved by the attending provider.

## 2017-01-18 NOTE — H&P (Signed)
Shelby Payne 01/18/2017 7:54 AM Location: [facility] Patient #: [id number] DOB: 1955-07-04 Undefined / Language: [name] / Race: White Female  History of Present Illness Marcello Moores A. Arlow Spiers MD; 01/18/2017 2:00 PM) Patient words: Patient sent at the request of Dr. [name] for mammographic abnormality noted on screening mammogram. Patient had area of calcifications and mass lesion measuring 1.7 cm in the right breast lower inner quadrant. 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 The patient denies any history of breast pain, breast mass or nipple discharge. Her mother had breast cancer in her 36s. She is sore from the biopsy.  The patient is a 61 year old female.   Past Surgical History Tawni Pummel, RN; 01/18/2017 7:54 AM) Oral Surgery  Diagnostic Studies History Tawni Pummel, RN; 01/18/2017 7:54 AM) Colonoscopy 1-5 years ago Mammogram within last year  Medication History Tawni Pummel, RN; 01/18/2017 7:55 AM) Medications Reconciled  Social History Tawni Pummel, RN; 01/18/2017 7:54 AM) Alcohol use Occasional alcohol use. Caffeine use Carbonated beverages, Coffee, Tea. No drug use Tobacco use Never smoker.  Family History Tawni Pummel, RN; 01/18/2017 7:54 AM) Arthritis Mother. Breast Cancer Mother. Cancer Sister. Cerebrovascular Accident Father. Hypertension Father, Mother. Prostate Cancer Father. Thyroid problems Mother.  Pregnancy / Birth History Tawni Pummel, RN; 01/18/2017 7:54 AM) [name] 3 Para 2  Other Problems Tawni Pummel, RN; 01/18/2017 7:54 AM) Anxiety Disorder Depression High blood pressure     Review of Systems Sunday Spillers Ledford RN; 01/18/2017 7:54 AM) General Not Present- Appetite Loss, Chills, Fatigue, Fever, Night Sweats, Weight Gain and Weight Loss. Skin Present- Rash. Not Present- Change in Wart/Mole, Dryness, Hives, Jaundice, New  Lesions, Non-Healing Wounds and Ulcer. HEENT Present- Ringing in the Ears, Seasonal Allergies and Wears glasses/contact lenses. Not Present- Earache, Hearing Loss, Hoarseness, Nose Bleed, Oral Ulcers, Sinus Pain, Sore Throat, Visual Disturbances and Yellow Eyes. Respiratory Not Present- Bloody sputum, Chronic Cough, Difficulty Breathing, Snoring and Wheezing. Breast Not Present- Breast Mass, Breast Pain, Nipple Discharge and Skin Changes. Cardiovascular Not Present- Chest Pain, Difficulty Breathing Lying Down, Leg Cramps, Palpitations, Rapid Heart Rate, Shortness of Breath and Swelling of Extremities. Gastrointestinal Present- Hemorrhoids. Not Present- Abdominal Pain, Bloating, Bloody Stool, Change in Bowel Habits, Chronic diarrhea, Constipation, Difficulty Swallowing, Excessive gas, Gets full quickly at meals, Indigestion, Nausea, Rectal Pain and Vomiting. Female Genitourinary Not Present- Frequency, Nocturia, Painful Urination, Pelvic Pain and Urgency. Musculoskeletal Present- Joint Pain. Not Present- Back Pain, Joint Stiffness, Muscle Pain, Muscle Weakness and Swelling of Extremities. Neurological Present- Numbness and Tingling. Not Present- Decreased Memory, Fainting, Headaches, Seizures, Tremor, Trouble walking and Weakness. Psychiatric Present- Anxiety and Depression. Not Present- Bipolar, Change in Sleep Pattern, Fearful and Frequent crying. Endocrine Not Present- Cold Intolerance, Excessive Hunger, Hair Changes, Heat Intolerance, Hot flashes and New Diabetes. Hematology Not Present- Blood Thinners, Easy Bruising, Excessive bleeding, Gland problems, HIV and Persistent Infections.   Physical Exam (Briane Birden A. Geraldina Parrott MD; 01/18/2017 2:01 PM)  General Mental Status-Alert. General Appearance-Consistent with stated age. Hydration-Well hydrated. Voice-Normal.  Head and Neck Head-normocephalic, atraumatic with no lesions or palpable masses. Trachea-midline. Thyroid Gland  Characteristics - normal size and consistency.  Chest and Lung Exam Chest and lung exam reveals -quiet, even and easy respiratory effort with no use of accessory muscles and on auscultation, normal breath sounds, no adventitious sounds and normal vocal resonance. Inspection Chest Wall - Normal. Back - normal.  Breast Breast - Left-Symmetric, Non Tender, No Biopsy scars, no Dimpling, No Inflammation, No Lumpectomy  scars, No Mastectomy scars, No Peau d' Orange. Breast - Right-Symmetric, Non Tender, No Biopsy scars, no Dimpling, No Inflammation, No Lumpectomy scars, No Mastectomy scars, No Peau d' Orange. Breast Lump-No Palpable Breast Mass. Note: steristrips noted at biopsy site  Cardiovascular Cardiovascular examination reveals -normal heart sounds, regular rate and rhythm with no murmurs and normal pedal pulses bilaterally.  Neurologic Neurologic evaluation reveals -alert and oriented x 3 with no impairment of recent or remote memory. Mental Status-Normal.  Lymphatic Head & Neck  General Head & Neck Lymphatics: Bilateral - Description - Normal. Axillary  General Axillary Region: Bilateral - Description - Normal. Tenderness - Non Tender.    Assessment & Plan (Catarina Huntley A. Jeanetta Alonzo MD; 01/18/2017 2:07 PM)  BREAST CANCER, RIGHT (C50.911) Impression: Discussed options of breast conservation versus mastectomy and reconstruction. The patient has opted for right breast seed localized partial mastectomy and right axillary sentinel lymph node mapping. Risk of lumpectomy include bleeding, infection, seroma, more surgery, use of seed/wire, wound care, cosmetic deformity and the need for other treatments, death , blood clots, death. Pt agrees to proceed. Risk of sentinel lymph node mapping include bleeding, infection, lymphedema, shoulder pain. stiffness, dye allergy. cosmetic deformity , blood clots, death, need for more surgery. Pt agres to proceed.  Current Plans You are being  scheduled for surgery- Our schedulers will call you.  You should hear from our office's scheduling department within 5 working days about the location, date, and time of surgery. We try to make accommodations for patient's preferences in scheduling surgery, but sometimes the OR schedule or the surgeon's schedule prevents [name] from making those accommodations.  If you have not heard from our office [phone]) in 5 working days, call the office and ask for your surgeon's nurse.  If you have other questions about your diagnosis, plan, or surgery, call the office and ask for your surgeon's nurse.  We discussed the staging and pathophysiology of breast cancer. We discussed all of the different options for treatment for breast cancer including surgery, chemotherapy, radiation therapy, Herceptin, and antiestrogen therapy. We discussed a sentinel lymph node biopsy as she does not appear to having lymph node involvement right now. We discussed the performance of that with injection of radioactive tracer and blue dye. We discussed that she would have an incision underneath her axillary hairline. We discussed that there is a bout a 10-20% chance of having a positive node with a sentinel lymph node biopsy and we will await the permanent pathology to make any other first further decisions in terms of her treatment. One of these options might be to return to the operating room to perform an axillary lymph node dissection. We discussed about a 1-2% risk lifetime of chronic shoulder pain as well as lymphedema associated with a sentinel lymph node biopsy. We discussed the options for treatment of the breast cancer which included lumpectomy versus a mastectomy. We discussed the performance of the lumpectomy with a wire placement. We discussed a 10-20% chance of a positive margin requiring reexcision in the operating room. We also discussed that she may need radiation therapy or antiestrogen therapy or both if she  undergoes lumpectomy. We discussed the mastectomy and the postoperative care for that as well. We discussed that there is no difference in her survival whether she undergoes lumpectomy with radiation therapy or antiestrogen therapy versus a mastectomy. There is a slight difference in the local recurrence rate being 3-5% with lumpectomy and about 1% with a mastectomy. We discussed the risks  of operation including bleeding, infection, possible reoperation. She understands her further therapy will be based on what her stages at the time of her operation.  Pt Education - [facility] Breast Biopsy HCI: discussed with patient and provided information. Pt Education - breast cancer surgery: discussed with patient and provided information.

## 2017-01-18 NOTE — Progress Notes (Signed)
[facility]  Telephone:([phone] Fax:([phone]) [phone]     ID: Shelby Payne DOB: February 18, 1955  MR#: [id number]  GNF#:[id number]  Patient Care Team: Hoyt Koch, MD as PCP - General (Internal Medicine) Amilcar Reever, Virgie Dad, MD as Consulting Physician (Oncology) Eppie Gibson, MD as Attending Physician (Radiation Oncology) Erroll Luna, MD as Consulting Physician (General Surgery) Christene Slates, MD as Physician Assistant (Radiology) OTHER MD:  CHIEF COMPLAINT: Estrogen receptor positive breast cancer  CURRENT TREATMENT: Awaiting definitive surgery   HISTORY OF CURRENT ILLNESS: The patient had bilateral screening mammography at [facility] 12[facility]28/2016.  There was a new mass in the right breast lower inner quadrant and some new grouped calcifications in the right breast same quadrant.  Accordingly the patient was called back for right diagnostic mammography and ultrasonography 01/09/2017.  The breast density was category A.  In the right breast lower inner quadrant there was a new irregular mass.  Again noted was a new group of heterogeneous calcifications in the same quadrant.  Ultrasound confirmed a 1.7 cm irregular mass in the right breast lower inner quadrant.  There were no abnormalities sonographically in the right axilla.  Taken together,  the mass plus calcifications measured up to 4 cm.  Accordingly on 01/11/2017 she underwent biopsy of the right breast mass in question, showing [phone]) invasive ductal carcinoma, grade 2, estrogen receptor 90% positive with strong staining intensity, progesterone receptor negative, with an Mib-1 of 20% and no HER-2 amplification, the signals ratio being 1.26 and the number per cell 2.20  The patientPayne subsequent history is as detailed below.  INTERVAL HISTORY: Shelby Payne reports today for evaluation and is accompanied by her husband, [name] and her daughter [name].  Her case was also presented at the multidisciplinary breast  cancer conference on the same day. At that time a preliminary plan was proposed: breast conserving surgery with sentinel node sampling, oncotype testing, adjuvant radiation and anti-estrogens.   REVIEW OF SYSTEMS: Shelby Payne is doing well. She stays busy at work and is constantly on her feet walking around. She denies unusual headaches, visual changes, nausea, vomiting, or dizziness. There has been no unusual cough, phlegm production, or pleurisy. This been no change in bowel or bladder habits. She denies unexplained fatigue or unexplained weight loss, bleeding, rash, or fever. A detailed review of systems was otherwise entirely stable.    PAST MEDICAL HISTORY: Past Medical History:  Diagnosis Date  . Acute sinusitis, unspecified   . Depression   . Hypertension   . Obesity, unspecified   . Pneumonia, organism unspecified(486)     PAST SURGICAL HISTORY: Past Surgical History:  Procedure Laterality Date  . WISDOM TOOTH EXTRACTION      FAMILY HISTORY Family History  Problem Relation Age of Onset  . Hypertension Mother   . Hypothyroidism Mother   . Hyperlipidemia Mother   . Gout Mother   . Breast cancer Mother   . Heart disease Father   . Hypertension Father   . Diabetes Father   . Hypothyroidism Father   . Hypothyroidism Sister   . Cancer Sister        angiosarcoma - mets  . Esophageal cancer Maternal Uncle   . COPD Neg Hx   . Colon cancer Neg Hx   . Rectal cancer Neg Hx   . Stomach cancer Neg Hx   Her father passed away from a massive stroke at 67. Her mother passed away from complications of breast cancer at 61. She had been diagnosed age 62. The  patientPayne sister had angiosarcoma at 27. There is no otherhistory of breast or ovarian cancer in this famiy. The patient is of Ashkenazi extraction  GYNECOLOGIC HISTORY:  No LMP recorded.  Menarche age 33, first live borth age 83, she is GXP2. Menopause 2015, never used OCPs or HR   SOCIAL HISTORY:  She is a Electrical engineer at  [employer]. Her husband, [name] was a Curator at [employer] in [place]' law, but is retired now. Her daughter [name] teaches nursing at [employer]. [name] is concerned that on he fatherPayne side a cousin has tested positive for a deleterious gene; she will try to bring [name] that information].Son, [name] passed away. The patient does not have any grandchildren. She attends [employer].     ADVANCED DIRECTIVES:    HEALTH MAINTENANCE: Social History   Tobacco Use  . Smoking status: Never Smoker  . Smokeless tobacco: Never Used  Substance Use Topics  . Alcohol use: Yes    Comment: rare glass of wine  . Drug use: No     Colonoscopy: 2013  PAP:  Bone density:never   Allergies  Allergen Reactions  . Bactrim [Sulfamethoxazole-Trimethoprim] Rash  . Hctz [Hydrochlorothiazide] Other (See Comments)    Hyponatremia    Current Outpatient Medications  Medication Sig Dispense Refill  . metoprolol succinate (TOPROL-XL) 100 MG 24 hr tablet Take 1 tablet (100 mg total) daily by mouth. 90 tablet 3  . sertraline (ZOLOFT) 100 MG tablet Take 1 tablet (100 mg total) daily by mouth. 90 tablet 3  . Telmisartan-Amlodipine 40-5 MG TABS Take 1 tablet daily by mouth. 90 tablet 3  . triamcinolone cream (KENALOG) 0.1 % Apply 1 application 2 (two) times daily topically. 30 g 6   No current facility-administered medications for this visit.     OBJECTIVE: middle aged White woman who appears stated age  22:   01/18/17 1250  BP: (!) 142/80  Pulse: 74  Resp: 18  Temp: 98.6 F (37 C)  SpO2: 96%     Body mass index is 37.64 kg/m.   Wt Readings from Last 3 Encounters:  01/18/17 219 lb 4.8 oz (99.5 kg)  12/19/16 221 lb (100.2 kg)  12/03/15 228 lb 2 oz (103.5 kg)      ECOG FS:0 - Asymptomatic  Ocular: Sclerae unicteric, pupils round and equal Ear-nose-throat: Oropharynx clear and moist Lymphatic: No cervical or supraclavicular adenopathy Lungs no rales or rhonchi Heart  regular rate and rhythm Abd soft, nontender, positive bowel sounds MSK no focal spinal tenderness, no joint edema Neuro: non-focal, well-oriented, appropriate affect Breasts: the Right breast is s/p recent biopsy; there is a small ecchimosis. The left breast is benign. Both axillae are benign   LAB RESULTS:  CMP     Component Value Date/Time   NA 137 01/18/2017 1238   K 4.1 01/18/2017 1238   CL 102 12/19/2016 1634   CO2 22 01/18/2017 1238   GLUCOSE 91 01/18/2017 1238   BUN 19.5 01/18/2017 1238   CREATININE 0.7 01/18/2017 1238   CALCIUM 9.8 01/18/2017 1238   PROT 7.9 01/18/2017 1238   ALBUMIN 4.3 01/18/2017 1238   AST 19 01/18/2017 1238   ALT 24 01/18/2017 1238   ALKPHOS 53 01/18/2017 1238   BILITOT 0.39 01/18/2017 1238   GFRNONAA >60 11/28/2015 1522   GFRAA >60 11/28/2015 1522    No results found for: TOTALPROTELP, ALBUMINELP, A1GS, A2GS, BETS, BETA2SER, GAMS, MSPIKE, SPEI  No results found for: KPAFRELGTCHN, LAMBDASER, KAPLAMBRATIO  Lab Results  Component Value Date   WBC 10.4 (H) 01/18/2017   NEUTROABS 6.7 (H) 01/18/2017   HGB 13.8 01/18/2017   HCT 40.9 01/18/2017   MCV 87.2 01/18/2017   PLT 232 01/18/2017    _0 @  No results found for: LABCA2  No components found for: IFOYDX412  No results for input(s): INR in the last 168 hours.  No results found for: LABCA2  No results found for: INO676  No results found for: HMC947  No results found for: SJG283  No results found for: CA2729  No components found for: HGQUANT  No results found for: CEA1 / No results found for: CEA1   No results found for: AFPTUMOR  No results found for: Curtis  No results found for: PSA1  Appointment on 01/18/2017  Component Date Value Ref Range Status  . Sodium 01/18/2017 137  136 - 145 mEq/L Final  . Potassium 01/18/2017 4.1  3.5 - 5.1 mEq/L Final  . Chloride 01/18/2017 102  98 - 109 mEq/L Final  . CO2 01/18/2017 22  22 - 29 mEq/L Final  . Glucose  01/18/2017 91  70 - 140 mg/dl Final   Glucose reference range is for nonfasting patients. Fasting glucose reference range is 70- 100.  [name] Kitchen BUN 01/18/2017 19.5  7.0 - 26.0 mg/dL Final  . Creatinine 01/18/2017 0.7  0.6 - 1.1 mg/dL Final  . Total Bilirubin 01/18/2017 0.39  0.20 - 1.20 mg/dL Final  . Alkaline Phosphatase 01/18/2017 53  40 - 150 U/L Final  . AST 01/18/2017 19  5 - 34 U/L Final  . ALT 01/18/2017 24  0 - 55 U/L Final  . Total Protein 01/18/2017 7.9  6.4 - 8.3 g/dL Final  . Albumin 01/18/2017 4.3  3.5 - 5.0 g/dL Final  . Calcium 01/18/2017 9.8  8.4 - 10.4 mg/dL Final  . Anion Gap 01/18/2017 13* 3 - 11 mEq/L Final  . EGFR 01/18/2017 >60  >60 ml/min/1.73 m2 Final   eGFR is calculated using the CKD-EPI Creatinine Equation (2009)  . WBC 01/18/2017 10.4* 3.9 - 10.3 10e3/uL Final  . NEUT# 01/18/2017 6.7* 1.5 - 6.5 10e3/uL Final  . HGB 01/18/2017 13.8  11.6 - 15.9 g/dL Final  . HCT 01/18/2017 40.9  34.8 - 46.6 % Final  . Platelets 01/18/2017 232  145 - 400 10e3/uL Final  . MCV 01/18/2017 87.2  79.5 - 101.0 fL Final  . MCH 01/18/2017 29.5  25.1 - 34.0 pg Final  . MCHC 01/18/2017 33.8  31.5 - 36.0 g/dL Final  . RBC 01/18/2017 4.69  3.70 - 5.45 10e6/uL Final  . RDW 01/18/2017 13.2  11.2 - 14.5 % Final  . lymph# 01/18/2017 2.8  0.9 - 3.3 10e3/uL Final  . MONO# 01/18/2017 0.6  0.1 - 0.9 10e3/uL Final  . Eosinophils Absolute 01/18/2017 0.2  0.0 - 0.5 10e3/uL Final  . Basophils Absolute 01/18/2017 0.1  0.0 - 0.1 10e3/uL Final  . NEUT% 01/18/2017 64.3  38.4 - 76.8 % Final  . LYMPH% 01/18/2017 26.6  14.0 - 49.7 % Final  . MONO% 01/18/2017 6.1  0.0 - 14.0 % Final  . EOS% 01/18/2017 2.1  0.0 - 7.0 % Final  . BASO% 01/18/2017 0.9  0.0 - 2.0 % Final    (this displays the last labs from the last 3 days)  No results found for: TOTALPROTELP, ALBUMINELP, A1GS, A2GS, BETS, BETA2SER, GAMS, MSPIKE, SPEI (this displays SPEP labs)  No results found for: KPAFRELGTCHN, LAMBDASER,  KAPLAMBRATIO (kappa/lambda light chains)  No  results found for: HGBA, HGBA2QUANT, HGBFQUANT, HGBSQUAN (Hemoglobinopathy evaluation)   No results found for: LDH  No results found for: IRON, TIBC, IRONPCTSAT (Iron and TIBC)  No results found for: FERRITIN  Urinalysis    Component Value Date/Time   COLORURINE Yellow 12/29/2008 0809   APPEARANCEUR CLEAR 12/29/2008 0809   LABSPEC 1.020 12/29/2008 0809   PHURINE 6.0 12/29/2008 0809   GLUCOSEU NEGATIVE 12/29/2008 0809   BILIRUBINUR NEGATIVE 12/29/2008 0809   KETONESUR NEGATIVE 12/29/2008 0809   UROBILINOGEN 0.2 12/29/2008 0809   NITRITE NEGATIVE 12/29/2008 0809   LEUKOCYTESUR NEGATIVE 12/29/2008 0809     STUDIES: Outside reports reviewed with the patient  ELIGIBLE FOR AVAILABLE RESEARCH PROTOCOL: no  ASSESSMENT: 61 y.o.  woman s/p right breast lower inner quadrant biopsy 01/11/2017 for a cT1c cN0, stage IA invasive ductal breast cancer, estrogen receptor positive, progesterone receptor negativem HER-2 not amplified, with an Mib-1 of 20%  (1) genetics testing pending  (2) breast conserving surgery and sentinel sampling  (3) Oncotype DX to be sent from the definitive surgical sample  (4) adjuvant radiation to follow  (5) antiestrogens to start at the completion of local treatment  PLAN: We spent the better part of todayPayne hour-long appointment discussing the biology of her diagnosis and the specifics of her situation. We first reviewed the fact that cancer is not one disease but more than 100 different diseases and that it is important to keep them separate-- otherwise when friends and relatives discuss their own cancer experiences with Shelby Payne confusion can result. Similarly we explained that if breast cancer spreads to the bone or liver, the patient would not have bone cancer or liver cancer, but breast cancer in the bone and breast cancer in the liver: one cancer in three places-- not 3 different cancers which  otherwise would have to be treated in 3 different ways.  We discussed the difference between local and systemic therapy. In terms of loco-regional treatment, lumpectomy plus radiation is equivalent to mastectomy as far as survival is concerned. For this reason, and because the cosmetic results are generally superior, we recommend breast conserving surgery.   We then discussed the rationale for systemic therapy. There is some risk that this cancer may have already spread to other parts of her body. Patients frequently ask at this point about bone scans, CAT scans and PET scans to find out if they have occult breast cancer somewhere else. The problem is that in early stage disease we are much more likely to find false positives then true cancers and this would expose the patient to unnecessary procedures as well as unnecessary radiation. Scans cannot answer the question the patient really would like to know, which is whether she has microscopic disease elsewhere in her body. For those reasons we do not recommend them.  Of course we would proceed to aggressive evaluation of any symptoms that might suggest metastatic disease, but that is not the case here.  Next we went over the options for systemic therapy which are anti-estrogens, anti-HER-2 immunotherapy, and chemotherapy. Shelby Payne does not meet criteria for anti-HER-2 immunotherapy. She is a good candidate for anti-estrogens.  The question of chemotherapy is more complicated. Chemotherapy is most effective in rapidly growing, aggressive tumors. It is much less effective in not-high-grade, not fast growing cancers, like Shelby Payne. For that reason we are going to request an Oncotype from the definitive surgical sample, as suggested by NCCN guidelines. That will help [name] make a definitive decision regarding chemotherapy in this case.  Shelby Payne also qualifies for genetics testing. In patients who carry a deleterious mutation [for example in a  BRCA gene], the risk of a  new breast cancer developing in the future may be sufficiently great that the patient may choose bilateral mastectomies. However if she wishes to keep her breasts in that situation it is safe to do so. That would require intensified screening, which generally means not only yearly mammography but a yearly breast MRI as well. Of course, if there is a deleterious mutation bilateral oophorectomy would be necessary as there is no standard screening protocol for ovarian cancer.  Shelby Payne was very clear today that even if she did carry a deleterious mutation she would opt for intensified screening. Accordingly there is nor reason to postpone her upcoming surgery.  The overall plan then is for breast conserving surgery with sentinel node sampling, oncotype testing, chemo if appropriate, adjuvant radiation, and anti-estrogens. Genetics testing will be done next available.  Shelby Payne has a good understanding of the overall plan. She agrees with it. She knows the goal of treatment in her case is cure. She will call with any problems that may develop before her next visit here.   Magrinat, Virgie Dad, MD  01/18/17 2:45 PM Medical Oncology and Hematology [facility] [address] [place], [place] [address] Tel. [phone]    Fax. [phone]  This document serves as a record of services personally performed by Chauncey Cruel, MD. It was created on his behalf by Margit Banda, a trained medical scribe. The creation of this record is based on the scribePayne personal observations and the providerPayne statements to them.   I have reviewed the above documentation for accuracy and completeness, and I agree with the above.

## 2017-01-18 NOTE — Progress Notes (Signed)
Nutrition Assessment  Reason for Assessment:  Pt seen in Breast Clinic  ASSESSMENT:   61 year old female with new diagnosis of breast cancer.  Past medical history of depression, ,HLD, hyperglycemia.    Patient reports normal appetite  Medications:  reviewed  Labs: reviewed  Anthropometrics:   Height: 64 inches Weight: 219 lb 4.8 oz BMI: 37   NUTRITION DIAGNOSIS: Food and nutrition related knowledge deficit related to new diagnosis of breast cancer as evidenced by no prior need for nutrition related information.  INTERVENTION:   Discussed and provided packet of information regarding nutritional tips for breast cancer patients.  Questions answered.  Teachback method used.  Contact information provided and patient knows to contact me with questions/concerns.    MONITORING, EVALUATION, and GOAL: Pt will consume a healthy plant based diet to maintain lean body mass throughout treatment.   Jaydyn Menon B. Zenia Resides, [place], [place] Registered Dietitian [phone] (pager)

## 2017-01-23 ENCOUNTER — Encounter (HOSPITAL_BASED_OUTPATIENT_CLINIC_OR_DEPARTMENT_OTHER)
Admission: RE | Admit: 2017-01-23 | Discharge: 2017-01-23 | Disposition: A | Discharge status: Home / Self Care | Payer: BC Managed Care – PPO | Source: Ambulatory Visit | Attending: Surgery | Admitting: Surgery

## 2017-01-23 ENCOUNTER — Encounter (HOSPITAL_BASED_OUTPATIENT_CLINIC_OR_DEPARTMENT_OTHER): Payer: Self-pay | Admitting: *Deleted

## 2017-01-23 ENCOUNTER — Ambulatory Visit: Payer: Self-pay | Admitting: Surgery

## 2017-01-23 ENCOUNTER — Other Ambulatory Visit: Payer: Self-pay

## 2017-01-23 DIAGNOSIS — Z809 Family history of malignant neoplasm, unspecified: Secondary | ICD-10-CM | POA: Diagnosis not present

## 2017-01-23 DIAGNOSIS — C50911 Malignant neoplasm of unspecified site of right female breast: Secondary | ICD-10-CM | POA: Diagnosis present

## 2017-01-23 DIAGNOSIS — E669 Obesity, unspecified: Secondary | ICD-10-CM | POA: Diagnosis not present

## 2017-01-23 DIAGNOSIS — Z823 Family history of stroke: Secondary | ICD-10-CM | POA: Diagnosis not present

## 2017-01-23 DIAGNOSIS — Z170 Estrogen receptor positive status [ER+]: Secondary | ICD-10-CM | POA: Diagnosis not present

## 2017-01-23 DIAGNOSIS — Z8042 Family history of malignant neoplasm of prostate: Secondary | ICD-10-CM | POA: Diagnosis not present

## 2017-01-23 DIAGNOSIS — Z8249 Family history of ischemic heart disease and other diseases of the circulatory system: Secondary | ICD-10-CM | POA: Diagnosis not present

## 2017-01-23 DIAGNOSIS — E785 Hyperlipidemia, unspecified: Secondary | ICD-10-CM | POA: Diagnosis not present

## 2017-01-23 DIAGNOSIS — I10 Essential (primary) hypertension: Secondary | ICD-10-CM | POA: Diagnosis not present

## 2017-01-23 DIAGNOSIS — F329 Major depressive disorder, single episode, unspecified: Secondary | ICD-10-CM | POA: Diagnosis not present

## 2017-01-23 DIAGNOSIS — Z6836 Body mass index (BMI) 36.0-36.9, adult: Secondary | ICD-10-CM | POA: Diagnosis not present

## 2017-01-23 DIAGNOSIS — Z8379 Family history of other diseases of the digestive system: Secondary | ICD-10-CM | POA: Diagnosis not present

## 2017-01-23 DIAGNOSIS — F419 Anxiety disorder, unspecified: Secondary | ICD-10-CM | POA: Diagnosis not present

## 2017-01-23 DIAGNOSIS — C50311 Malignant neoplasm of lower-inner quadrant of right female breast: Secondary | ICD-10-CM | POA: Diagnosis not present

## 2017-01-23 DIAGNOSIS — Z8261 Family history of arthritis: Secondary | ICD-10-CM | POA: Diagnosis not present

## 2017-01-23 DIAGNOSIS — Z803 Family history of malignant neoplasm of breast: Secondary | ICD-10-CM | POA: Diagnosis not present

## 2017-01-23 NOTE — Progress Notes (Signed)
Ensure pre surgery drink given with instructions to complete by [place], hibiclens soap given with instructions, pt verbalized understanding.

## 2017-01-24 ENCOUNTER — Telehealth: Payer: Self-pay | Admitting: *Deleted

## 2017-01-24 NOTE — Telephone Encounter (Signed)
  Oncology Nurse Navigator Documentation  Navigator Location: [facility] (01/24/17 1500)   )Navigator Encounter Type: Telephone;[facility] Follow-up (01/24/17 1500) Telephone: Outgoing Call;Clinic/[facility]C Follow-up (01/24/17 1500)                                                  Time Spent with Patient: 15 (01/24/17 1500)

## 2017-01-25 ENCOUNTER — Ambulatory Visit (HOSPITAL_BASED_OUTPATIENT_CLINIC_OR_DEPARTMENT_OTHER)
Admission: RE | Admit: 2017-01-25 | Discharge: 2017-01-25 | Disposition: A | Discharge status: Home / Self Care | Payer: BC Managed Care – PPO | Source: Ambulatory Visit | Attending: Surgery | Admitting: Surgery

## 2017-01-25 ENCOUNTER — Ambulatory Visit (HOSPITAL_BASED_OUTPATIENT_CLINIC_OR_DEPARTMENT_OTHER): Payer: BC Managed Care – PPO | Admitting: Anesthesiology

## 2017-01-25 ENCOUNTER — Ambulatory Visit (HOSPITAL_COMMUNITY)
Admission: RE | Admit: 2017-01-25 | Discharge: 2017-01-25 | Disposition: A | Discharge status: Home / Self Care | Payer: BC Managed Care – PPO | Source: Ambulatory Visit | Attending: Surgery | Admitting: Surgery

## 2017-01-25 ENCOUNTER — Encounter: Payer: Self-pay | Admitting: Surgery

## 2017-01-25 ENCOUNTER — Other Ambulatory Visit: Payer: Self-pay

## 2017-01-25 ENCOUNTER — Encounter (HOSPITAL_BASED_OUTPATIENT_CLINIC_OR_DEPARTMENT_OTHER)
Admission: RE | Disposition: A | Discharge status: Home / Self Care | Payer: Self-pay | Source: Ambulatory Visit | Attending: Surgery

## 2017-01-25 ENCOUNTER — Encounter (HOSPITAL_BASED_OUTPATIENT_CLINIC_OR_DEPARTMENT_OTHER): Payer: Self-pay | Admitting: Anesthesiology

## 2017-01-25 DIAGNOSIS — C50311 Malignant neoplasm of lower-inner quadrant of right female breast: Secondary | ICD-10-CM | POA: Diagnosis not present

## 2017-01-25 DIAGNOSIS — I10 Essential (primary) hypertension: Secondary | ICD-10-CM | POA: Insufficient documentation

## 2017-01-25 DIAGNOSIS — F419 Anxiety disorder, unspecified: Secondary | ICD-10-CM | POA: Insufficient documentation

## 2017-01-25 DIAGNOSIS — E669 Obesity, unspecified: Secondary | ICD-10-CM | POA: Insufficient documentation

## 2017-01-25 DIAGNOSIS — F329 Major depressive disorder, single episode, unspecified: Secondary | ICD-10-CM | POA: Insufficient documentation

## 2017-01-25 DIAGNOSIS — C50911 Malignant neoplasm of unspecified site of right female breast: Secondary | ICD-10-CM

## 2017-01-25 DIAGNOSIS — Z170 Estrogen receptor positive status [ER+]: Principal | ICD-10-CM

## 2017-01-25 DIAGNOSIS — Z6836 Body mass index (BMI) 36.0-36.9, adult: Secondary | ICD-10-CM | POA: Insufficient documentation

## 2017-01-25 DIAGNOSIS — Z8249 Family history of ischemic heart disease and other diseases of the circulatory system: Secondary | ICD-10-CM | POA: Insufficient documentation

## 2017-01-25 DIAGNOSIS — E785 Hyperlipidemia, unspecified: Secondary | ICD-10-CM | POA: Insufficient documentation

## 2017-01-25 DIAGNOSIS — Z8042 Family history of malignant neoplasm of prostate: Secondary | ICD-10-CM | POA: Insufficient documentation

## 2017-01-25 DIAGNOSIS — Z823 Family history of stroke: Secondary | ICD-10-CM | POA: Insufficient documentation

## 2017-01-25 DIAGNOSIS — Z8261 Family history of arthritis: Secondary | ICD-10-CM | POA: Insufficient documentation

## 2017-01-25 DIAGNOSIS — Z8379 Family history of other diseases of the digestive system: Secondary | ICD-10-CM | POA: Insufficient documentation

## 2017-01-25 DIAGNOSIS — Z803 Family history of malignant neoplasm of breast: Secondary | ICD-10-CM | POA: Insufficient documentation

## 2017-01-25 DIAGNOSIS — Z809 Family history of malignant neoplasm, unspecified: Secondary | ICD-10-CM | POA: Insufficient documentation

## 2017-01-25 HISTORY — DX: Anxiety disorder, unspecified: F41.9

## 2017-01-25 HISTORY — DX: Malignant (primary) neoplasm, unspecified: C80.1

## 2017-01-25 HISTORY — PX: BREAST LUMPECTOMY WITH RADIOACTIVE SEED AND SENTINEL LYMPH NODE BIOPSY: SHX6550

## 2017-01-25 SURGERY — BREAST LUMPECTOMY WITH RADIOACTIVE SEED AND SENTINEL LYMPH NODE BIOPSY
Anesthesia: General | Site: Breast | Laterality: Right

## 2017-01-25 MED ORDER — MEPERIDINE HCL 25 MG/ML IJ SOLN: 6.2500 mg | INTRAMUSCULAR | Status: DC | PRN

## 2017-01-25 MED ORDER — HYDROCODONE-ACETAMINOPHEN 5-325 MG PO TABS: 1.0000 | ORAL_TABLET | Freq: Four times a day (QID) | ORAL | 0 refills | Status: DC | PRN

## 2017-01-25 MED ORDER — CHLORHEXIDINE GLUCONATE CLOTH 2 % EX PADS: 6.0000 | MEDICATED_PAD | Freq: Once | CUTANEOUS | Status: DC

## 2017-01-25 MED ORDER — CEFAZOLIN SODIUM-DEXTROSE 2-4 GM/100ML-% IV SOLN
INTRAVENOUS | Status: AC
  Filled 2017-01-25: qty 100

## 2017-01-25 MED ORDER — FENTANYL CITRATE (PF) 100 MCG/2ML IJ SOLN
INTRAMUSCULAR | Status: AC
  Filled 2017-01-25: qty 2

## 2017-01-25 MED ORDER — DEXAMETHASONE SODIUM PHOSPHATE 10 MG/ML IJ SOLN
INTRAMUSCULAR | Status: AC
  Filled 2017-01-25: qty 1

## 2017-01-25 MED ORDER — ACETAMINOPHEN 500 MG PO TABS
ORAL_TABLET | ORAL | Status: AC
  Filled 2017-01-25: qty 2

## 2017-01-25 MED ORDER — DEXTROSE 5 % IV SOLN: 3.0000 g | INTRAVENOUS | Status: DC

## 2017-01-25 MED ORDER — PROPOFOL 10 MG/ML IV BOLUS
INTRAVENOUS | Status: DC | PRN
  Administered 2017-01-25: 200 mg via INTRAVENOUS

## 2017-01-25 MED ORDER — GABAPENTIN 300 MG PO CAPS
ORAL_CAPSULE | ORAL | Status: AC
  Filled 2017-01-25: qty 1

## 2017-01-25 MED ORDER — CELECOXIB 200 MG PO CAPS
ORAL_CAPSULE | ORAL | Status: AC
  Filled 2017-01-25: qty 1

## 2017-01-25 MED ORDER — DEXAMETHASONE SODIUM PHOSPHATE 10 MG/ML IJ SOLN
INTRAMUSCULAR | Status: DC | PRN
  Administered 2017-01-25: 10 mg via INTRAVENOUS

## 2017-01-25 MED ORDER — BUPIVACAINE-EPINEPHRINE (PF) 0.25% -1:200000 IJ SOLN
INTRAMUSCULAR | Status: DC | PRN
  Administered 2017-01-25: 18 mL

## 2017-01-25 MED ORDER — METOCLOPRAMIDE HCL 5 MG/ML IJ SOLN: 10.0000 mg | Freq: Once | INTRAMUSCULAR | Status: DC | PRN

## 2017-01-25 MED ORDER — MIDAZOLAM HCL 2 MG/2ML IJ SOLN
1.0000 mg | INTRAMUSCULAR | Status: DC | PRN
  Administered 2017-01-25 (×2): 2 mg via INTRAVENOUS

## 2017-01-25 MED ORDER — 0.9 % SODIUM CHLORIDE (POUR BTL) OPTIME
TOPICAL | Status: DC | PRN
  Administered 2017-01-25: 1000 mL

## 2017-01-25 MED ORDER — LIDOCAINE HCL (CARDIAC) 20 MG/ML IV SOLN
INTRAVENOUS | Status: DC | PRN
  Administered 2017-01-25: 100 mg via INTRAVENOUS

## 2017-01-25 MED ORDER — PHENYLEPHRINE HCL 10 MG/ML IJ SOLN
INTRAMUSCULAR | Status: DC | PRN
Start: 2017-01-25 — End: 2017-01-25
  Administered 2017-01-25: 80 ug via INTRAVENOUS
  Administered 2017-01-25: 120 ug via INTRAVENOUS

## 2017-01-25 MED ORDER — BUPIVACAINE-EPINEPHRINE (PF) 0.5% -1:200000 IJ SOLN
INTRAMUSCULAR | Status: DC | PRN
  Administered 2017-01-25: 30 mL via PERINEURAL

## 2017-01-25 MED ORDER — ONDANSETRON HCL 4 MG/2ML IJ SOLN
INTRAMUSCULAR | Status: AC
  Filled 2017-01-25: qty 2

## 2017-01-25 MED ORDER — FENTANYL CITRATE (PF) 100 MCG/2ML IJ SOLN
25.0000 ug | INTRAMUSCULAR | Status: DC | PRN
  Administered 2017-01-25 (×3): 25 ug via INTRAVENOUS

## 2017-01-25 MED ORDER — METHYLENE BLUE 0.5 % INJ SOLN
INTRAVENOUS | Status: AC
  Filled 2017-01-25: qty 10

## 2017-01-25 MED ORDER — FENTANYL CITRATE (PF) 100 MCG/2ML IJ SOLN
50.0000 ug | INTRAMUSCULAR | Status: AC | PRN
  Administered 2017-01-25 (×2): 50 ug via INTRAVENOUS
  Administered 2017-01-25 (×2): 25 ug via INTRAVENOUS

## 2017-01-25 MED ORDER — MIDAZOLAM HCL 2 MG/2ML IJ SOLN
INTRAMUSCULAR | Status: AC
  Filled 2017-01-25: qty 2

## 2017-01-25 MED ORDER — HYDROCODONE-ACETAMINOPHEN 7.5-325 MG PO TABS: 1.0000 | ORAL_TABLET | Freq: Once | ORAL | Status: DC | PRN

## 2017-01-25 MED ORDER — ACETAMINOPHEN 500 MG PO TABS
1000.0000 mg | ORAL_TABLET | ORAL | Status: AC
Start: 2017-01-26 — End: 2017-01-25
  Administered 2017-01-25: 1000 mg via ORAL

## 2017-01-25 MED ORDER — LACTATED RINGERS IV SOLN
INTRAVENOUS | Status: DC
  Administered 2017-01-25: 14:00:00 via INTRAVENOUS

## 2017-01-25 MED ORDER — CEFAZOLIN SODIUM-DEXTROSE 2-3 GM-%(50ML) IV SOLR
INTRAVENOUS | Status: DC | PRN
  Administered 2017-01-25: 2 g via INTRAVENOUS

## 2017-01-25 MED ORDER — ONDANSETRON HCL 4 MG/2ML IJ SOLN
INTRAMUSCULAR | Status: DC | PRN
  Administered 2017-01-25: 4 mg via INTRAVENOUS

## 2017-01-25 MED ORDER — IBUPROFEN 800 MG PO TABS: 800.0000 mg | ORAL_TABLET | Freq: Three times a day (TID) | ORAL | 0 refills | Status: DC | PRN

## 2017-01-25 MED ORDER — TECHNETIUM TC 99M SULFUR COLLOID FILTERED
1.0000 | Freq: Once | INTRAVENOUS | Status: AC | PRN
  Administered 2017-01-25: 1 via INTRADERMAL

## 2017-01-25 MED ORDER — SCOPOLAMINE 1 MG/3DAYS TD PT72: 1.0000 | MEDICATED_PATCH | Freq: Once | TRANSDERMAL | Status: DC | PRN

## 2017-01-25 MED ORDER — EPHEDRINE SULFATE 50 MG/ML IJ SOLN
INTRAMUSCULAR | Status: DC | PRN
  Administered 2017-01-25: 10 mg via INTRAVENOUS
  Administered 2017-01-25: 15 mg via INTRAVENOUS

## 2017-01-25 MED ORDER — GABAPENTIN 300 MG PO CAPS
300.0000 mg | ORAL_CAPSULE | ORAL | Status: AC
  Administered 2017-01-25: 300 mg via ORAL

## 2017-01-25 SURGICAL SUPPLY — 50 items
ADH SKN CLS APL DERMABOND .7 (GAUZE/BANDAGES/DRESSINGS) ×2
APPLIER CLIP 9.375 MED OPEN (MISCELLANEOUS) ×3
APR CLP MED 9.3 20 MLT OPN (MISCELLANEOUS) ×1
BINDER BREAST LRG (GAUZE/BANDAGES/DRESSINGS) IMPLANT
BINDER BREAST MEDIUM (GAUZE/BANDAGES/DRESSINGS) IMPLANT
BINDER BREAST XLRG (GAUZE/BANDAGES/DRESSINGS) IMPLANT
BINDER BREAST XXLRG (GAUZE/BANDAGES/DRESSINGS) ×2 IMPLANT
BLADE SURG 15 STRL LF DISP TIS (BLADE) ×1 IMPLANT
BLADE SURG 15 STRL SS (BLADE) ×3
CANISTER SUC SOCK COL 7IN (MISCELLANEOUS) IMPLANT
CANISTER SUCT 1200ML W/VALVE (MISCELLANEOUS) ×3 IMPLANT
CHLORAPREP W/TINT 26ML (MISCELLANEOUS) ×3 IMPLANT
CLIP APPLIE 9.375 MED OPEN (MISCELLANEOUS) ×1 IMPLANT
COVER BACK TABLE 60X90IN (DRAPES) ×3 IMPLANT
COVER MAYO STAND STRL (DRAPES) ×3 IMPLANT
COVER PROBE W GEL 5X96 (DRAPES) ×3 IMPLANT
DECANTER SPIKE VIAL GLASS SM (MISCELLANEOUS) IMPLANT
DERMABOND ADVANCED (GAUZE/BANDAGES/DRESSINGS) ×4
DERMABOND ADVANCED .7 DNX12 (GAUZE/BANDAGES/DRESSINGS) ×1 IMPLANT
DEVICE DUBIN W/COMP PLATE 8390 (MISCELLANEOUS) ×3 IMPLANT
DRAPE LAPAROSCOPIC ABDOMINAL (DRAPES) ×3 IMPLANT
DRAPE UTILITY XL STRL (DRAPES) ×3 IMPLANT
ELECT COATED BLADE 2.86 ST (ELECTRODE) ×3 IMPLANT
ELECT REM PT RETURN 9FT ADLT (ELECTROSURGICAL) ×3
ELECTRODE REM PT RTRN 9FT ADLT (ELECTROSURGICAL) ×1 IMPLANT
GLOVE BIOGEL PI IND STRL 8 (GLOVE) ×1 IMPLANT
GLOVE BIOGEL PI INDICATOR 8 (GLOVE) ×2
GLOVE ECLIPSE 8.0 STRL XLNG CF (GLOVE) ×3 IMPLANT
GOWN STRL REUS W/ TWL LRG LVL3 (GOWN DISPOSABLE) ×2 IMPLANT
GOWN STRL REUS W/TWL LRG LVL3 (GOWN DISPOSABLE) ×6
HEMOSTAT ARISTA ABSORB 3G PWDR (MISCELLANEOUS) IMPLANT
HEMOSTAT SNOW SURGICEL 2X4 (HEMOSTASIS) ×3 IMPLANT
KIT MARKER MARGIN INK (KITS) ×3 IMPLANT
NDL HYPO 25X1 1.5 SAFETY (NEEDLE) ×1 IMPLANT
NDL SAFETY ECLIPSE 18X1.5 (NEEDLE) IMPLANT
NEEDLE HYPO 18GX1.5 SHARP (NEEDLE)
NEEDLE HYPO 25X1 1.5 SAFETY (NEEDLE) ×3 IMPLANT
NS IRRIG 1000ML POUR BTL (IV SOLUTION) ×3 IMPLANT
PACK BASIN DAY SURGERY FS (CUSTOM PROCEDURE TRAY) ×3 IMPLANT
PENCIL BUTTON HOLSTER BLD 10FT (ELECTRODE) ×3 IMPLANT
SLEEVE SCD COMPRESS KNEE MED (MISCELLANEOUS) ×3 IMPLANT
SPONGE LAP 4X18 X RAY DECT (DISPOSABLE) ×3 IMPLANT
SUT MNCRL AB 4-0 PS2 18 (SUTURE) ×3 IMPLANT
SUT VICRYL 3-0 CR8 SH (SUTURE) ×3 IMPLANT
SYR CONTROL 10ML LL (SYRINGE) ×3 IMPLANT
TOWEL OR 17X24 6PK STRL BLUE (TOWEL DISPOSABLE) ×3 IMPLANT
TOWEL OR NON WOVEN STRL DISP B (DISPOSABLE) ×3 IMPLANT
TUBE CONNECTING 20'X1/4 (TUBING) ×1
TUBE CONNECTING 20X1/4 (TUBING) ×2 IMPLANT
YANKAUER SUCT BULB TIP NO VENT (SUCTIONS) ×3 IMPLANT

## 2017-01-25 NOTE — Anesthesia Postprocedure Evaluation (Signed)
Anesthesia Post Note  Patient: Shelby Payne  Procedure(s) Performed: RIGHT BREAST LUMPECTOMY  WITH 2 RADIOACTIVE SEEDS AND SENTINEL LYMPH NODE BIOPSY (Right Breast)     Patient location during evaluation: PACU Anesthesia Type: General Level of consciousness: awake and alert and oriented Pain management: pain level controlled Vital Signs Assessment: post-procedure vital signs reviewed and stable Respiratory status: spontaneous breathing, nonlabored ventilation and respiratory function stable Cardiovascular status: blood pressure returned to baseline and stable Postop Assessment: no apparent nausea or vomiting Anesthetic complications: no    Last Vitals:  Vitals:   01/25/17 1357 01/25/17 1615  BP:  (!) 142/81  Pulse: 64 79  Resp: (!) 25 12  Temp:  (P) 36.5 C  SpO2: 100% (P) 100%    Last Pain:  Vitals:   01/25/17 1615  TempSrc:   PainSc: (P) 0-No pain                 [name]

## 2017-01-25 NOTE — Addendum Note (Signed)
Addendum  created 01/25/17 1639 by Josephine Igo, MD   Order list changed, Order sets accessed

## 2017-01-25 NOTE — Discharge Instructions (Signed)
[facility] °Office Phone Number [phone] ° °BREAST BIOPSY/ PARTIAL MASTECTOMY: POST OP INSTRUCTIONS ° °Always review your discharge instruction sheet given to you by the facility where your surgery was performed. ° °IF YOU HAVE DISABILITY OR FAMILY LEAVE FORMS, YOU MUST BRING THEM TO THE OFFICE FOR PROCESSING.  DO NOT GIVE THEM TO YOUR DOCTOR. ° °1. A prescription for pain medication may be given to you upon discharge.  Take your pain medication as prescribed, if needed.  If narcotic pain medicine is not needed, then you may take acetaminophen (Tylenol) or ibuprofen (Advil) as needed. °2. Take your usually prescribed medications unless otherwise directed °3. If you need a refill on your pain medication, please contact your pharmacy.  They will contact our office to request authorization.  Prescriptions will not be filled after 5pm or on week-ends. °4. You should eat very light the first 24 hours after surgery, such as soup, crackers, pudding, etc.  Resume your normal diet the day after surgery. °5. Most patients will experience some swelling and bruising in the breast.  Ice packs and a good support bra will help.  Swelling and bruising can take several days to resolve.  °6. It is common to experience some constipation if taking pain medication after surgery.  Increasing fluid intake and taking a stool softener will usually help or prevent this problem from occurring.  A mild laxative (Milk of Magnesia or Miralax) should be taken according to package directions if there are no bowel movements after 48 hours. °7. Unless discharge instructions indicate otherwise, you may remove your bandages 24-48 hours after surgery, and you may shower at that time.  You may have steri-strips (small skin tapes) in place directly over the incision.  These strips should be left on the skin for 7-10 days.  If your surgeon used skin glue on the incision, you may shower in 24 hours.  The glue will flake off over the  next 2-3 weeks.  Any sutures or staples will be removed at the office during your follow-up visit. °8. ACTIVITIES:  You may resume regular daily activities (gradually increasing) beginning the next day.  Wearing a good support bra or sports bra minimizes pain and swelling.  You may have sexual intercourse when it is comfortable. °a. You may drive when you no longer are taking prescription pain medication, you can comfortably wear a seatbelt, and you can safely maneuver your car and apply brakes. °b. RETURN TO WORK:  ______________________________________________________________________________________ °9. You should see your doctor in the office for a follow-up appointment approximately two weeks after your surgery.  Your doctor’s nurse will typically make your follow-up appointment when she calls you with your pathology report.  Expect your pathology report 2-3 business days after your surgery.  You may call to check if you do not hear from us after three days. °10. OTHER INSTRUCTIONS: _______________________________________________________________________________________________ _____________________________________________________________________________________________________________________________________ °_____________________________________________________________________________________________________________________________________ °_____________________________________________________________________________________________________________________________________ ° °WHEN TO CALL YOUR DOCTOR: °1. Fever over 101.0 °2. Nausea and/or vomiting. °3. Extreme swelling or bruising. °4. Continued bleeding from incision. °5. Increased pain, redness, or drainage from the incision. ° °The clinic staff is available to answer your questions during regular business hours.  Please don’t hesitate to call and ask to speak to one of the nurses for clinical concerns.  If you have a medical emergency, go to the nearest  emergency room or call 911.  A surgeon from [facility] is always on call at the hospital. ° °For further questions, please visit [email or web address]  ° ° ° ° °  Post Anesthesia Home Care Instructions ° °Activity: °Get plenty of rest for the remainder of the day. A responsible individual must stay with you for 24 hours following the procedure.  °For the next 24 hours, DO NOT: °-Drive a car °-Operate machinery °-Drink alcoholic beverages °-Take any medication unless instructed by your physician °-Make any legal decisions or sign important papers. ° °Meals: °Start with liquid foods such as gelatin or soup. Progress to regular foods as tolerated. Avoid greasy, spicy, heavy foods. If nausea and/or vomiting occur, drink only clear liquids until the nausea and/or vomiting subsides. Call your physician if vomiting continues. ° °Special Instructions/Symptoms: °Your throat may feel dry or sore from the anesthesia or the breathing tube placed in your throat during surgery. If this causes discomfort, gargle with warm salt water. The discomfort should disappear within 24 hours. ° °If you had a scopolamine patch placed behind your ear for the management of post- operative nausea and/or vomiting: ° °1. The medication in the patch is effective for 72 hours, after which it should be removed.  Wrap patch in a tissue and discard in the trash. Wash hands thoroughly with soap and water. °2. You may remove the patch earlier than 72 hours if you experience unpleasant side effects which may include dry mouth, dizziness or visual disturbances. °3. Avoid touching the patch. Wash your hands with soap and water after contact with the patch. °  ° °

## 2017-01-25 NOTE — Interval H&P Note (Signed)
History and Physical Interval Note:  01/25/2017 1:26 PM  Shelby Payne  has presented today for surgery, with the diagnosis of RIGHT BREAST  CANCER  The various methods of treatment have been discussed with the patient and family. After consideration of risks, benefits and other options for treatment, the patient has consented to  Procedure(s): RIGHT BREAST LUMPECTOMY  WITH 2 RADIOACTIVE SEEDS AND SENTINEL LYMPH NODE BIOPSY (Right) as a surgical intervention .  The patient's history has been reviewed, patient examined, no change in status, stable for surgery.  I have reviewed the patient's chart and labs.  Questions were answered to the patient's satisfaction.     [place]

## 2017-01-25 NOTE — Op Note (Signed)
Preoperative diagnosis: Stage I right breast cancer lower inner quadrant  Postoperative diagnosis: Same  Procedure: Right breast seed localized partial mastectomy and right axillary sentinel lymph node mapping  (deep axillary lymph node)  Surgeon Erroll Luna MD  Anesthesia: LMA with pectoral block and local  EBL: 30 cc  Specimens: #1 right breast mass with seed and clip #2 right breast posterior microcalcifications with seed #3 3 right axillary sentinel nodes hot  Drains: None  IV fluids: Per anesthesia record  Indications for procedure: The patient is a 61 year old female with stage I right breast cancer.  She is opted for breast conservation.  She had a posterior cluster of microcalcifications that were not core biopsied but excision was recommended times lumpectomy since these were located posterior to her stage I right breast cancer.  Risk benefits and other options of treatment and surgery was discussed with the patient as outlined in my history and physical.The procedure has been discussed with the patient. Alternatives to surgery have been discussed with the patient.  Risks of surgery include bleeding,  Infection,  Seroma formation, death,  and the need for further surgery.   The patient understands and wishes to proceed.Sentinel lymph node mapping and dissection has been discussed with the patient.  Risk of bleeding,  Infection,  Seroma formation,  Additional procedures,,  Shoulder weakness , arm swelling,   Shoulder stiffness,  Nerve and blood vessel injury and reaction to the mapping dyes have been discussed.  Alternatives to surgery have been discussed with the patient.  The patient agrees to proceed.  Description of procedure: The patient was met in the holding area.  The right breast was marked as the correct side.  Anesthesia placed a right pectoral block.  All questions were answered.  She was taken back to the operating room.  Of note she was injected by nuclear medicine for  lymph node mapping prior to going back to the operating room.  She was placed upon the OR table.  After induction of general anesthesia, the right breast was prepped and draped in sterile fashion.  Timeout was done.  Neoprobe was used and the seeds which were 2 were localized in the right lower inner quadrant.  There about a centimeter from each other.  An incision was made along the inferior mammary fold and dissection was carried out around the more posterior seed near the chest wall.  This was excised in its entirety.  This was then imaged in the operating room and a seed with a calcification was noted.  Of note this was oriented prior to passing it off the field.  The second area was localized with the neoprobe.  All tissue around the seed and clip were excised with grossly negative margins.  This was oriented with ink.  The intraoperative assessment using radiography revealed the seed clip to be in the specimen.  These were sent separately and reviewed by the radiologist.  They were then pinned accordingly and sent to pathology.  The cavity was examined.  Hemostasis achieved with cautery.  Clips were placed to mark the lumpectomy cavity.  The cavity was closed the deep layer 3-0 Vicryl.  4-0 Monocryl was used to close the skin.  In a similar fashion the right axilla was probed.  Settings were placed to technetium.  Hot spot identified in the right axilla and marked with a marking pen.  Small incision made in the right axilla and dissection was carried down into the level 1 nodal basin.  There were 3 hot nodes identified with the neoprobe and removed.  Background counts approaches 0.  Hemostasis achieved with cautery.  This was closed with 3-0 Vicryl and 4-0 Monocryl.  Dermabond applied to both incisions.  All final counts were found to be correct.  Binder placed.  The patient was awoke extubated taken to recovery in satisfactory condition.

## 2017-01-25 NOTE — Progress Notes (Signed)
Assisted Dr. Foster with right, ultrasound guided, pectoralis block. Side rails up, monitors on throughout procedure. See vital signs in flow sheet. Tolerated Procedure well. 

## 2017-01-25 NOTE — Anesthesia Preprocedure Evaluation (Addendum)
Anesthesia Evaluation  Patient identified by MRN, date of birth, ID band Patient awake    Reviewed: Allergy & Precautions, NPO status , Patient's Chart, lab work & pertinent test results, reviewed documented beta blocker date and time   Airway Mallampati: II  TM Distance: >3 FB Neck ROM: Full    Dental no notable dental hx. (+) Teeth Intact, Chipped,  Veneers bilateral upper incisors:   Pulmonary neg pulmonary ROS,    Pulmonary exam normal breath sounds clear to auscultation       Cardiovascular hypertension, Pt. on medications and Pt. on home beta blockers Normal cardiovascular exam Rhythm:Regular Rate:Normal     Neuro/Psych PSYCHIATRIC DISORDERS Anxiety Depression negative neurological ROS     GI/Hepatic negative GI ROS, Neg liver ROS,   Endo/Other  Right breast Ca Obesity  Hyperlipidemia  Renal/GU negative Renal ROS  negative genitourinary   Musculoskeletal negative musculoskeletal ROS (+)   Abdominal (+) + obese,   Peds  Hematology negative hematology ROS (+)   Anesthesia Other Findings Chipped left upper central incisor veneer  Reproductive/Obstetrics                            Anesthesia Physical Anesthesia Plan  ASA: II  Anesthesia Plan: General   Post-op Pain Management:  Regional for Post-op pain   Induction: Intravenous  PONV Risk Score and Plan: 4 or greater and Ondansetron, Dexamethasone, Midazolam, Scopolamine patch - Pre-op and Treatment may vary due to age or medical condition  Airway Management Planned: LMA  Additional Equipment:   Intra-op Plan:   Post-operative Plan: Extubation in OR  Informed Consent: I have reviewed the patients History and Physical, chart, labs and discussed the procedure including the risks, benefits and alternatives for the proposed anesthesia with the patient or authorized representative who has indicated his/her understanding and  acceptance.   Dental advisory given  Plan Discussed with: CRNA, Anesthesiologist and Surgeon  Anesthesia Plan Comments:         Anesthesia Quick Evaluation

## 2017-01-25 NOTE — Anesthesia Procedure Notes (Signed)
Anesthesia Regional Block: Pectoralis block   Pre-Anesthetic Checklist: ,, timeout performed, Correct Patient, Correct Site, Correct Laterality, Correct Procedure, Correct Position, site marked, Risks and benefits discussed,  Surgical consent,  Pre-op evaluation,  At surgeon's request and post-op pain management  Laterality: Right  Prep: chloraprep       Needles:  Injection technique: Single-shot  Needle Type: Echogenic Stimulator Needle     Needle Length: 9cm  Needle Gauge: 21   Needle insertion depth: 7 cm   Additional Needles:   Procedures:,,,, ultrasound used (permanent image in chart),,,,  Narrative:  Start time: 01/25/2017 1:51 PM End time: 01/25/2017 1:56 PM Injection made incrementally with aspirations every 5 mL.  Performed by: Personally  Anesthesiologist: Josephine Igo, MD  Additional Notes: Timeout performed. Patient sedated. Relevant anatomy ID'd using [name]. Incremental 2-17ml injection of LA with frequent aspiration. Patient tolerated procedure well.

## 2017-01-25 NOTE — Transfer of Care (Signed)
Immediate Anesthesia Transfer of Care Note  Patient: Shelby Payne  Procedure(s) Performed: RIGHT BREAST LUMPECTOMY  WITH 2 RADIOACTIVE SEEDS AND SENTINEL LYMPH NODE BIOPSY (Right Breast)  Patient Location: PACU  Anesthesia Type:General  Level of Consciousness: awake, alert  and oriented  Airway & Oxygen Therapy: Patient Spontanous Breathing and Patient connected to face mask oxygen  Post-op Assessment: Report given to RN and Post -op Vital signs reviewed and stable  Post vital signs: Reviewed and stable  Last Vitals:  Vitals:   01/25/17 1356 01/25/17 1357  BP:    Pulse: 62 64  Resp: (!) 28 (!) 25  Temp:    SpO2: 100% 100%    Last Pain:  Vitals:   01/25/17 1325  TempSrc: Oral         Complications: No apparent anesthesia complications

## 2017-01-25 NOTE — Anesthesia Procedure Notes (Signed)
Procedure Name: LMA Insertion Performed by: Verita Lamb, CRNA Pre-anesthesia Checklist: Patient identified, Emergency Drugs available, Suction available, Patient being monitored and Timeout performed Patient Re-evaluated:Patient Re-evaluated prior to induction Preoxygenation: Pre-oxygenation with 100% oxygen Induction Type: IV induction LMA: LMA inserted LMA Size: 4.0 Tube type: Oral Number of attempts: 1 Placement Confirmation: positive ETCO2,  CO2 detector and breath sounds checked- equal and bilateral Dental Injury: Teeth and Oropharynx as per pre-operative assessment  Comments: Pt's upper front teeth chipped preop.  LMA inserted 4 atraumatically

## 2017-01-26 ENCOUNTER — Encounter (HOSPITAL_BASED_OUTPATIENT_CLINIC_OR_DEPARTMENT_OTHER): Payer: Self-pay | Admitting: Surgery

## 2017-01-26 NOTE — Addendum Note (Signed)
Addendum  created 01/26/17 0830 by Tawni Millers, CRNA   Charge Capture section accepted

## 2017-01-30 ENCOUNTER — Telehealth: Payer: Self-pay | Admitting: *Deleted

## 2017-01-30 NOTE — Telephone Encounter (Signed)
Received order for oncotype testing. Requisition sent to pathology. Received by Varney Biles. PAC fax to [facility] and [facility]

## 2017-02-08 ENCOUNTER — Telehealth: Payer: Self-pay | Admitting: Oncology

## 2017-02-08 NOTE — Telephone Encounter (Signed)
Confirmed new appointment day and time with patient

## 2017-02-13 ENCOUNTER — Other Ambulatory Visit: Payer: BC Managed Care – PPO

## 2017-02-13 ENCOUNTER — Encounter: Payer: BC Managed Care – PPO | Admitting: Genetic Counselor

## 2017-02-16 ENCOUNTER — Other Ambulatory Visit: Payer: BC Managed Care – PPO

## 2017-02-16 ENCOUNTER — Encounter: Payer: BC Managed Care – PPO | Admitting: Genetic Counselor

## 2017-02-27 ENCOUNTER — Telehealth: Payer: Self-pay | Admitting: *Deleted

## 2017-02-27 ENCOUNTER — Other Ambulatory Visit: Payer: Self-pay | Admitting: *Deleted

## 2017-02-27 ENCOUNTER — Other Ambulatory Visit: Payer: Self-pay | Admitting: Oncology

## 2017-02-27 DIAGNOSIS — C50311 Malignant neoplasm of lower-inner quadrant of right female breast: Secondary | ICD-10-CM

## 2017-02-27 NOTE — Progress Notes (Signed)
I called Shelby Payne, who currently has a pretty bad cold, and told her the results of her Oncotype, which me were going to need some chemo.  She will see me tomorrow to discuss.

## 2017-02-27 NOTE — Telephone Encounter (Signed)
Received oncotype score of 42/30%. Physician team notified 

## 2017-02-28 ENCOUNTER — Inpatient Hospital Stay: Payer: BC Managed Care – PPO

## 2017-02-28 ENCOUNTER — Other Ambulatory Visit: Payer: Self-pay | Admitting: *Deleted

## 2017-02-28 ENCOUNTER — Inpatient Hospital Stay: Payer: BC Managed Care – PPO | Attending: Oncology | Admitting: Oncology

## 2017-02-28 ENCOUNTER — Other Ambulatory Visit: Payer: Self-pay | Admitting: Oncology

## 2017-02-28 ENCOUNTER — Inpatient Hospital Stay (HOSPITAL_BASED_OUTPATIENT_CLINIC_OR_DEPARTMENT_OTHER): Payer: BC Managed Care – PPO | Admitting: Genetic Counselor

## 2017-02-28 ENCOUNTER — Encounter: Payer: Self-pay | Admitting: Genetic Counselor

## 2017-02-28 ENCOUNTER — Encounter: Payer: Self-pay | Admitting: *Deleted

## 2017-02-28 VITALS — BP 145/80 | HR 84 | Temp 99.2°F | Resp 18 | Ht 64.0 in | Wt 220.5 lb

## 2017-02-28 DIAGNOSIS — C50311 Malignant neoplasm of lower-inner quadrant of right female breast: Secondary | ICD-10-CM | POA: Insufficient documentation

## 2017-02-28 DIAGNOSIS — I10 Essential (primary) hypertension: Secondary | ICD-10-CM

## 2017-02-28 DIAGNOSIS — R6889 Other general symptoms and signs: Secondary | ICD-10-CM

## 2017-02-28 DIAGNOSIS — F320 Major depressive disorder, single episode, mild: Secondary | ICD-10-CM

## 2017-02-28 DIAGNOSIS — Z170 Estrogen receptor positive status [ER+]: Secondary | ICD-10-CM | POA: Insufficient documentation

## 2017-02-28 DIAGNOSIS — Z8042 Family history of malignant neoplasm of prostate: Secondary | ICD-10-CM

## 2017-02-28 LAB — CMP (CANCER CENTER ONLY)
ALBUMIN: 3.9 g/dL (ref 3.5–5.0)
ALK PHOS: 64 U/L (ref 40–150)
ALT: 14 U/L (ref 0–55)
AST: 12 U/L (ref 5–34)
Anion gap: 9 (ref 3–11)
BILIRUBIN TOTAL: 0.5 mg/dL (ref 0.2–1.2)
BUN: 9 mg/dL (ref 7–26)
CALCIUM: 9.9 mg/dL (ref 8.4–10.4)
CO2: 27 mmol/L (ref 22–29)
CREATININE: 0.72 mg/dL (ref 0.60–1.10)
Chloride: 98 mmol/L (ref 98–109)
GFR, Est AFR Am: >60 mL/min (ref >60)
Glucose, Bld: 85 mg/dL (ref 70–140)
POTASSIUM: 4.1 mmol/L (ref 3.3–4.7)
Sodium: 134 mmol/L — ABNORMAL LOW (ref 136–145)
TOTAL PROTEIN: 8.2 g/dL (ref 6.4–8.3)

## 2017-02-28 LAB — CBC WITH DIFFERENTIAL (CANCER CENTER ONLY)
BASOS ABS: 0 10*3/uL (ref 0.0–0.1)
BASOS PCT: 0 %
EOS ABS: 0.2 10*3/uL (ref 0.0–0.5)
EOS PCT: 1 %
HCT: 38.5 % (ref 34.8–46.6)
Hemoglobin: 13.1 g/dL (ref 11.6–15.9)
Lymphocytes Relative: 21 %
Lymphs Abs: 3.3 10*3/uL (ref 0.9–3.3)
MCH: 30 pg (ref 25.1–34.0)
MCHC: 34 g/dL (ref 31.5–36.0)
MCV: 88.3 fL (ref 79.5–101.0)
Monocytes Absolute: 1.5 10*3/uL — ABNORMAL HIGH (ref 0.1–0.9)
Monocytes Relative: 10 %
Neutro Abs: 10.8 10*3/uL — ABNORMAL HIGH (ref 1.5–6.5)
Neutrophils Relative %: 68 %
PLATELETS: 240 10*3/uL (ref 145–400)
RBC: 4.36 MIL/uL (ref 3.70–5.45)
RDW: 13.3 % (ref 11.2–16.1)
WBC Count: 15.8 10*3/uL — ABNORMAL HIGH (ref 3.9–10.3)

## 2017-02-28 LAB — INFLUENZA PANEL BY PCR (TYPE A & B)
Influenza A By PCR: NEGATIVE
Influenza B By PCR: NEGATIVE

## 2017-02-28 NOTE — Progress Notes (Signed)
[place]  [phone] Fax:([phone]) [phone]     ID: Shelby Payne DOB: Jul 16, 1955  MR#: [id number]  BSW#:[id number]  Patient Care Team: Hoyt Koch, MD as PCP - General (Internal Medicine) Leida Luton, Virgie Dad, MD as Consulting Physician (Oncology) Eppie Gibson, MD as Attending Physician (Radiation Oncology) Erroll Luna, MD as Consulting Physician (General Surgery) Christene Slates, MD as Physician Assistant (Radiology) OTHER MD:  CHIEF COMPLAINT: Estrogen receptor positive breast cancer  CURRENT TREATMENT: Adjuvant chemotherapy    HISTORY OF CURRENT ILLNESS: From the original intake note:  The patient had bilateral screening mammography at [facility] 12/28/2016.  There was a new mass in the right breast lower inner quadrant and some new grouped calcifications in the right breast same quadrant.  Accordingly the patient was called back for right diagnostic mammography and ultrasonography 01/09/2017.  The breast density was category A.  In the right breast lower inner quadrant there was a new irregular mass.  Again noted was a new group of heterogeneous calcifications in the same quadrant.  Ultrasound confirmed a 1.7 cm irregular mass in the right breast lower inner quadrant.  There were no abnormalities sonographically in the right axilla.  Taken together,  the mass plus calcifications measured up to 4 cm.  Accordingly on 01/11/2017 she underwent biopsy of the right breast mass in question, showing [phone]) invasive ductal carcinoma, grade 2, estrogen receptor 90% positive with strong staining intensity, progesterone receptor negative, with an Mib-1 of 20% and no HER-2 amplification, the signals ratio being 1.26 and the number per cell 2.20  The patient's subsequent history is as detailed below.  INTERVAL HISTORY: Shelby Payne returns today for a follow-up of her estrogen receptor positive breast cancer. She is accompanied by her husband and daughter.  Since her last visit here she underwent right lumpectomy and sentinel lymph node sampling.  The final pathology ([id number]) confirmed an invasive ductal carcinoma, grade 2, measuring 1.5 cm.  Margins were negative.  All 7 sentinel lymph nodes were clear.    Unfortunately the patient's Oncotype score was quite high, at 42%.  This predicts a 30% risk of recurrence outside the breast within 9 years if the patient's only systemic therapy is tamoxifen for 5 years.  It also predicts a significant benefit from chemotherapy.  She is here today to discuss those results.  REVIEW OF SYSTEMS: Shelby Payne is doing well, overall. She tolerated surgery well and reports mild soreness at the beginning, but it has since resolved. She denies unusual headaches, visual changes, nausea, vomiting, or dizziness. There has been no unusual cough, phlegm production, or pleurisy. This been no change in bowel or bladder habits. She denies unexplained fatigue or unexplained weight loss, bleeding, rash, or fever. A detailed review of systems was otherwise noncontributory.    PAST MEDICAL HISTORY: Past Medical History:  Diagnosis Date  . Acute sinusitis, unspecified   . Anxiety   . Cancer ([place]) 12/2016   right breast cancer  . Depression   . Family history of prostate cancer   . Hypertension   . Obesity, unspecified   . Pneumonia, organism unspecified(486)     PAST SURGICAL HISTORY: Past Surgical History:  Procedure Laterality Date  . BREAST LUMPECTOMY WITH RADIOACTIVE SEED AND SENTINEL LYMPH NODE BIOPSY Right 01/25/2017   Procedure: RIGHT BREAST LUMPECTOMY  WITH 2 RADIOACTIVE SEEDS AND SENTINEL LYMPH NODE BIOPSY;  Surgeon: Erroll Luna, MD;  Location: [place];  Service: General;  Laterality: Right;  . WISDOM TOOTH EXTRACTION  FAMILY HISTORY Family History  Problem Relation Age of Onset  . Hypertension Mother   . Hypothyroidism Mother   . Hyperlipidemia Mother   . Gout Mother   . Breast  cancer Mother   . Heart disease Father   . Hypertension Father   . Diabetes Father   . Hypothyroidism Father   . Hypothyroidism Sister   . Cancer Sister        angiosarcoma - mets  . Esophageal cancer Maternal Uncle   . COPD Neg Hx   . Colon cancer Neg Hx   . Rectal cancer Neg Hx   . Stomach cancer Neg Hx   Her father passed away from a massive stroke at 55. Her mother passed away from complications of breast cancer at 34. She had been diagnosed age 5. The patient's sister had angiosarcoma at 94. There is no otherhistory of breast or ovarian cancer in this famiy. The patient is of Ashkenazi extraction  GYNECOLOGIC HISTORY:  Patient's last menstrual period was 07/12/2011.  Menarche age 53, first live borth age 68, she is GXP2. Menopause 2015, never used OCPs or HR   SOCIAL HISTORY:  She is a Electrical engineer at [employer]. Her husband, [name] was a Curator at [employer] in [place]' law, but is retired now. Her daughter [name] teaches nursing at [employer]. [name] is concerned that on her father's side a cousin has tested positive for a deleterious gene; she will try to bring [name] that information]. Son, [name] passed away. The patient does not have any grandchildren. She attends [employer].     ADVANCED DIRECTIVES:    HEALTH MAINTENANCE: Social History   Tobacco Use  . Smoking status: Never Smoker  . Smokeless tobacco: Never Used  Substance Use Topics  . Alcohol use: Yes    Comment: rare glass of wine  . Drug use: No     Colonoscopy: 2013  PAP:  Bone density:never   Allergies  Allergen Reactions  . Bactrim [Sulfamethoxazole-Trimethoprim] Rash  . Hctz [Hydrochlorothiazide] Other (See Comments)    Hyponatremia    Current Outpatient Medications  Medication Sig Dispense Refill  . Coenzyme Q10 (CO Q-10) 200 MG CAPS Take by mouth.    [name] Kitchen HYDROcodone-acetaminophen (NORCO/VICODIN) 5-325 MG tablet Take 1-2 tablets by mouth every 6 (six) hours as  needed for moderate pain. 15 tablet 0  . ibuprofen (ADVIL,MOTRIN) 800 MG tablet Take 1 tablet (800 mg total) by mouth every 8 (eight) hours as needed. 30 tablet 0  . metoprolol succinate (TOPROL-XL) 100 MG 24 hr tablet Take 1 tablet (100 mg total) daily by mouth. 90 tablet 3  . Omega-3 Fatty Acids (FISH OIL PO) Take by mouth.    . sertraline (ZOLOFT) 100 MG tablet Take 1 tablet (100 mg total) daily by mouth. 90 tablet 3  . Telmisartan-Amlodipine 40-5 MG TABS Take 1 tablet daily by mouth. 90 tablet 3  . triamcinolone cream (KENALOG) 0.1 % Apply 1 application 2 (two) times daily topically. 30 g 6   No current facility-administered medications for this visit.     OBJECTIVE: middle aged White woman who had some upper respiratory symptoms during exam today  Vitals:   02/28/17 1442  BP: (!) 145/80  Pulse: 84  Resp: 18  Temp: 99.2 F (37.3 C)  SpO2: 98%     Body mass index is 37.85 kg/m.   Wt Readings from Last 3 Encounters:  02/28/17 220 lb 8 oz (100 kg)  01/25/17 214 lb (97.1 kg)  01/18/17 219 lb 4.8 oz (99.5 kg)      ECOG FS:1 - Symptomatic but completely ambulatory  Sclerae unicteric, EOMs intact Oropharynx clear and moist No cervical or supraclavicular adenopathy Lungs no rales or rhonchi Heart regular rate and rhythm Abd soft, nontender, positive bowel sounds MSK no focal spinal tenderness, no upper extremity lymphedema Neuro: nonfocal, well oriented, appropriate affect Breasts: The right breast is status post lumpectomy.  The cosmetic result is excellent.  The incisions are healing nicely without erythema inflammation or dehiscence.  The left breast is benign.  Both axillae are benign.  LAB RESULTS:  CMP     Component Value Date/Time   NA 137 01/18/2017 1238   K 4.1 01/18/2017 1238   CL 102 12/19/2016 1634   CO2 22 01/18/2017 1238   GLUCOSE 91 01/18/2017 1238   BUN 19.5 01/18/2017 1238   CREATININE 0.7 01/18/2017 1238   CALCIUM 9.8 01/18/2017 1238   PROT 7.9  01/18/2017 1238   ALBUMIN 4.3 01/18/2017 1238   AST 19 01/18/2017 1238   ALT 24 01/18/2017 1238   ALKPHOS 53 01/18/2017 1238   BILITOT 0.39 01/18/2017 1238   GFRNONAA >60 11/28/2015 1522   GFRAA >60 11/28/2015 1522    No results found for: TOTALPROTELP, ALBUMINELP, A1GS, A2GS, BETS, BETA2SER, GAMS, MSPIKE, SPEI  No results found for: KPAFRELGTCHN, LAMBDASER, KAPLAMBRATIO  Lab Results  Component Value Date   WBC 15.8 (H) 02/28/2017   NEUTROABS 10.8 (H) 02/28/2017   HGB 13.8 01/18/2017   HCT 38.5 02/28/2017   MCV 88.3 02/28/2017   PLT 240 02/28/2017    _0 @  No results found for: LABCA2  No components found for: YQMVHQ469  No results for input(s): INR in the last 168 hours.  No results found for: LABCA2  No results found for: GEX528  No results found for: UXL244  No results found for: WNU272  No results found for: CA2729  No components found for: HGQUANT  No results found for: CEA1 / No results found for: CEA1   No results found for: AFPTUMOR  No results found for: [place]  No results found for: PSA1  Appointment on 02/28/2017  Component Date Value Ref Range Status  . WBC Count 02/28/2017 15.8* 3.9 - 10.3 K/uL Final  . RBC 02/28/2017 4.36  3.70 - 5.45 MIL/uL Final  . Hemoglobin 02/28/2017 13.1  11.6 - 15.9 g/dL Final  . HCT 02/28/2017 38.5  34.8 - 46.6 % Final  . MCV 02/28/2017 88.3  79.5 - 101.0 fL Final  . MCH 02/28/2017 30.0  25.1 - 34.0 pg Final  . MCHC 02/28/2017 34.0  31.5 - 36.0 g/dL Final  . RDW 02/28/2017 13.3  11.2 - 16.1 % Final  . Platelet Count 02/28/2017 240  145 - 400 K/uL Final  . Smear Review 02/28/2017 PENDING   Incomplete  . Neutrophils Relative % 02/28/2017 68  % Final  . Neutro Abs 02/28/2017 10.8* 1.5 - 6.5 K/uL Final  . Lymphocytes Relative 02/28/2017 21  % Final  . Lymphs Abs 02/28/2017 3.3  0.9 - 3.3 K/uL Final  . Monocytes Relative 02/28/2017 10  % Final  . Monocytes Absolute 02/28/2017 1.5* 0.1 - 0.9 K/uL  Final  . Eosinophils Relative 02/28/2017 1  % Final  . Eosinophils Absolute 02/28/2017 0.2  0.0 - 0.5 K/uL Final  . Basophils Relative 02/28/2017 0  % Final  . Basophils Absolute 02/28/2017 0.0  0.0 - 0.1 K/uL Final   Performed at [facility],  [address] [place], [place] [address]    (this displays the last labs from the last 3 days)  No results found for: TOTALPROTELP, ALBUMINELP, A1GS, A2GS, BETS, BETA2SER, GAMS, MSPIKE, SPEI (this displays SPEP labs)  No results found for: KPAFRELGTCHN, LAMBDASER, KAPLAMBRATIO (kappa/lambda light chains)  No results found for: HGBA, HGBA2QUANT, HGBFQUANT, HGBSQUAN (Hemoglobinopathy evaluation)   No results found for: LDH  No results found for: IRON, TIBC, IRONPCTSAT (Iron and TIBC)  No results found for: FERRITIN  Urinalysis    Component Value Date/Time   COLORURINE Yellow 12/29/2008 0809   APPEARANCEUR CLEAR 12/29/2008 0809   LABSPEC 1.020 12/29/2008 0809   PHURINE 6.0 12/29/2008 0809   GLUCOSEU NEGATIVE 12/29/2008 0809   BILIRUBINUR NEGATIVE 12/29/2008 0809   KETONESUR NEGATIVE 12/29/2008 0809   UROBILINOGEN 0.2 12/29/2008 0809   NITRITE NEGATIVE 12/29/2008 0809   LEUKOCYTESUR NEGATIVE 12/29/2008 0809     STUDIES: Pathology discussed in detail with the patient  ELIGIBLE FOR AVAILABLE RESEARCH PROTOCOL: UPBEAT, ASA: Referral placed 02/28/2017  ASSESSMENT: 62 y.o.  woman s/p right breast lower inner quadrant biopsy 01/11/2017 for a cT1c cN0, stage IA invasive ductal breast cancer, estrogen receptor positive, progesterone receptor negativem HER-2 not amplified, with an Mib-1 of 20%  (1) genetics testing pending  (2) status post right lumpectomy and sentinel lymph node sampling 01/25/2017 for a pT1c pN0, stage Ia invasive ductal carcinoma, grade 2, with negative margins.  (3) Oncotype DX score of 42 predicts a 9-year risk of recurrence outside the breast of 30% if the patient's only  systemic therapy is tamoxifen for 5 years.  It also predicts a significant chemotherapy benefit.  (4) adjuvant chemotherapy will consist of cyclophosphamide and doxorubicin in dose dense fashion x4 followed by weekly paclitaxel x12  (5) adjuvant radiation to follow therapy  (6) antiestrogens to start at the completion of local treatment  PLAN: I spent approximately an hour with Shelby Payne and her family going over her situation.  She understands she has a small breast cancer which is not very aggressive, and which had not spread to lymph nodes.  This is a stage I tumor.  Note for the last it is a very dangerous 1.  We would not know this if we had not sent the Oncotype.  This tells me that even with the best surgery radiation and antiestrogens, she would still have a 30% chance of this tumor coming back outside the breast, meaning as stage IV incurable disease within the next 9 years, unless she takes chemotherapy.  If she does take chemotherapy she will get a better than 15% reduction in that risk so her disease free survival will be 86% or more.  We then discussed standard chemotherapy which will consist of cyclophosphamide and doxorubicin in dose dense fashion x4 followed by paclitaxel.  She has a good understanding of the possible toxicity side effects and complications of these agents.  She would like to start on March 09, 2017 if she is going to continue to work, or more likely she will start on March 13, 2017 if she decides to go on leave.  I have scheduled her for an echocardiogram and to meet with our chemotherapy nurse.  She will have a port placed by Dr. Brantley Stage ASAP.  She is going to see me on 03/09/2017 to discuss management of nausea and other side effects and how to take her supportive medications.  Shelby Payne has a good understanding of this plan.  She agrees with it.  She knows  to call for any problems that may develop before her next visit.  Incidentally we did a swab for the flu  today.  We also advised her not to proceed with the shingles vaccine at this point.  Daton Szilagyi, Virgie Dad, MD  02/28/17 2:57 PM Medical Oncology and Hematology [facility] [address] [place], [place] [address] Tel. [phone]    Fax. [phone]  This document serves as a record of services personally performed by Chauncey Cruel, MD. It was created on his behalf by Margit Banda, a trained medical scribe. The creation of this record is based on the scribe's personal observations and the provider's statements to them.  I have reviewed the above documentation for accuracy and completeness, and I agree with the above.

## 2017-03-01 ENCOUNTER — Encounter: Payer: Self-pay | Admitting: Genetic Counselor

## 2017-03-01 ENCOUNTER — Telehealth: Payer: Self-pay | Admitting: Oncology

## 2017-03-01 ENCOUNTER — Encounter: Payer: Self-pay | Admitting: *Deleted

## 2017-03-01 ENCOUNTER — Ambulatory Visit: Payer: Self-pay | Admitting: Surgery

## 2017-03-01 NOTE — Telephone Encounter (Signed)
Spoke to patient regarding upcoming January and February appointments per 1/22 los.

## 2017-03-01 NOTE — Progress Notes (Signed)
REFERRING PROVIDER: Chauncey Cruel, MD [address] [place], [place] [address]  PRIMARY PROVIDER:  Hoyt Koch, MD  PRIMARY REASON FOR VISIT:  1. Malignant neoplasm of lower-inner quadrant of right breast of female, estrogen receptor positive ([place])   2. Family history of prostate cancer      HISTORY OF PRESENT ILLNESS:   Ms. Roig, a 62 y.o. female, was seen for a [place] cancer genetics consultation at the request of Dr. Jana Hakim due to a personal and family history of cancer.  Ms. Cespedes presents to clinic today to discuss the possibility of a hereditary predisposition to cancer, genetic testing, and to further clarify her future cancer risks, as well as potential cancer risks for family members.   In 2018, at the age of 22, Ms. Klecker was diagnosed with invasive ductal carcinoma of the right breast.  The tumor is ER+/PR+/Her2-. This was treated with lumpectomy.  Oncotype indicated a high risk for recurrence so she will start chemotherapy soon.  She will also receive radiation and tamoxifen.     CANCER HISTORY:   No history exists.     HORMONAL RISK FACTORS:  Menarche was at age 24.  First live birth at age 10.  OCP use for approximately 0 years.  Ovaries intact: yes.  Hysterectomy: no.  Menopausal status: postmenopausal.  HRT use: 0 years. Colonoscopy: yes; normal. Mammogram within the last year: yes. Number of breast biopsies: 1. Up to date with pelvic exams:  no. Any excessive radiation exposure in the past:  no  Past Medical History:  Diagnosis Date  . Acute sinusitis, unspecified   . Anxiety   . Cancer ([place]) 12/2016   right breast cancer  . Depression   . Family history of prostate cancer   . Hypertension   . Obesity, unspecified   . Pneumonia, organism unspecified(486)     Past Surgical History:  Procedure Laterality Date  . BREAST LUMPECTOMY WITH RADIOACTIVE SEED AND SENTINEL LYMPH NODE BIOPSY Right 01/25/2017    Procedure: RIGHT BREAST LUMPECTOMY  WITH 2 RADIOACTIVE SEEDS AND SENTINEL LYMPH NODE BIOPSY;  Surgeon: Erroll Luna, MD;  Location: [place];  Service: General;  Laterality: Right;  . WISDOM TOOTH EXTRACTION      Social History   Socioeconomic History  . Marital status: Single    Spouse name: Not on file  . Number of children: Not on file  . Years of education: Not on file  . Highest education level: Not on file  Social Needs  . Financial resource strain: Not on file  . Food insecurity - worry: Not on file  . Food insecurity - inability: Not on file  . Transportation needs - medical: Not on file  . Transportation needs - non-medical: Not on file  Occupational History  . Not on file  Tobacco Use  . Smoking status: Never Smoker  . Smokeless tobacco: Never Used  Substance and Sexual Activity  . Alcohol use: Yes    Comment: rare glass of wine  . Drug use: No  . Sexual activity: Yes    Partners: Male  Other Topics Concern  . Not on file  Social History Narrative   HSG, [employer], Master's degree in speech pathology, Master physiology. Married [phone] son 40; 1 daughter 68 - at [facility] (12/12). 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 Marriage is in good  Health   Resigned from her job March 2014     FAMILY HISTORY:  We obtained  a detailed, 4-generation family history.  Significant diagnoses are listed below: Family History  Problem Relation Age of Onset  . Hypertension Mother   . Hypothyroidism Mother   . Hyperlipidemia Mother   . Gout Mother   . Breast cancer Mother 50  . Heart disease Father   . Hypertension Father   . Diabetes Father   . Hypothyroidism Father   . Prostate cancer Father        dx late 79s  . Hypothyroidism Sister   . Cancer Sister        angiosarcoma - mets  . Esophageal cancer Maternal Uncle   . Stroke Paternal Aunt   . Stroke Paternal Uncle   . Heart attack Maternal Grandfather   . ALS Paternal Grandfather   . COPD  Neg Hx   . Colon cancer Neg Hx   . Rectal cancer Neg Hx   . Stomach cancer Neg Hx     The patient has a son and daughter.  Her son committed suicide at age 21, but her daughter is alive and well at age 42.  She ha two sisters.  One died of metastatic angiosarcoma at 80, diagnosed at 82.  The other is cancer free.  Both parents are deceased.  The patient's father had a stroked at 83, was diagnosed with prostate cancer in his 61's and died at 3.  He had a brother and sister who died of strokes.  Both parents died at older ages.  His mother was a [place] Ashkenazi Jew, who came here when she was young.  His father was also Ashkenazi.  The patient's mother had two sisters and two brothers.  She immigrated from [place] so not much information is known on this side of the family.  No cancer history is known.  Ms. Stough is unaware of previous family history of genetic testing for hereditary cancer risks. Patient's maternal ancestors are of [place] descent, and paternal ancestors are of [place] and [name] descent. There is reported Ashkenazi Jewish ancestry. There is no known consanguinity.  GENETIC COUNSELING ASSESSMENT: MAEBEL MARASCO is a 62 y.o. female with a personal history of breast cancer and family history of breast and prostate cancer and Jewish ancestry which is somewhat suggestive of a hereditary cancer syndrome and predisposition to cancer. We, therefore, discussed and recommended the following at today's visit.   DISCUSSION: We discussed that about 5-10% of breast cancer is hereditary with most cases due to BRCA mutations.  About 1 in 68 women with Ashkenazi Jewish ancestry and breast cancer have a BRCA mutation, typically one of the three founder mutations. There are other genes associated with hereditary breast cancer syndromes, including ATM, CHEK2 and PALB2.  We reviewed the characteristics, features and inheritance patterns of hereditary cancer syndromes. We also discussed  genetic testing, including the appropriate family members to test, the process of testing, insurance coverage and turn-around-time for results. We discussed the implications of a negative, positive and/or variant of uncertain significant result. We recommended Ms. Sprunger pursue genetic testing for the hereditary cancer gene panel. The Hereditary Gene Panel offered by [facility] includes sequencing and/or deletion duplication testing of the following 47 genes: APC, ATM, AXIN2, BARD1, BMPR1A, BRCA1, BRCA2, BRIP1, CDH1, CDK4, CDKN2A (p14ARF), CDKN2A (p16INK4a), CHEK2, CTNNA1, DICER1, EPCAM (Deletion/duplication testing only), GREM1 (promoter region deletion/duplication testing only), KIT, MEN1, MLH1, MSH2, MSH3, MSH6, MUTYH, NBN, NF1, NHTL1, PALB2, PDGFRA, PMS2, POLD1, POLE, PTEN, RAD50, RAD51C, RAD51D, SDHB, SDHC, SDHD, SMAD4, SMARCA4. STK11, TP53, TSC1, TSC2, and  VHL.  The following genes were evaluated for sequence changes only: SDHA and HOXB13 c.251G>A variant only.   Based on Ms. Hinger personal and family history of cancer, she meets medical criteria for genetic testing. Despite that she meets criteria, she may still have an out of pocket cost. We discussed that if her out of pocket cost for testing is over $100, the laboratory will call and confirm whether she wants to proceed with testing.  If the out of pocket cost of testing is less than $100 she will be billed by the genetic testing laboratory.   We discussed that the patient's husbands family reportedly had a "gene" running in the family.  His mother died of pancreatic cancer, and he is of Jewish descent.  We discussed that this is suggestive of a BRCA mutation and recommended that he too get tested.  He will make an appointment in the future.  PLAN: After considering the risks, benefits, and limitations, Ms. Garland  provided informed consent to pursue genetic testing and the blood sample was sent to [facility] for analysis of  the common hereditary cancer panel. Results should be available within approximately 2-3 weeks' time, at which point they will be disclosed by telephone to Ms. Dimon, as will any additional recommendations warranted by these results. Ms. Fraiser will receive a summary of her genetic counseling visit and a copy of her results once available. This information will also be available in Epic. We encouraged Ms. Landowski to remain in contact with cancer genetics annually so that we can continuously update the family history and inform her of any changes in cancer genetics and testing that may be of benefit for her family. Ms. Sykora questions were answered to her satisfaction today. Our contact information was provided should additional questions or concerns arise.  Lastly, we encouraged Ms. Parrott to remain in contact with cancer genetics annually so that we can continuously update the family history and inform her of any changes in cancer genetics and testing that may be of benefit for this family.   Ms.  Greenfield questions were answered to her satisfaction today. Our contact information was provided should additional questions or concerns arise. Thank you for the referral and allowing [name] to share in the care of your patient.   Maymuna Detzel P. Florene Glen, [place], [facility] Certified Genetic Counselor [email or web address] phone: [phone]  The patient was seen for a total of 60 minutes in face-to-face genetic counseling.  This patient was discussed with Drs. Magrinat, Lindi Adie and/or Burr Medico who agrees with the above.    _______________________________________________________________________ For Office Staff:  Number of people involved in session: 2 Was an Intern/ student involved with case: no

## 2017-03-01 NOTE — Telephone Encounter (Signed)
Spoke to patient regarding upcoming January appointment updates per 1/23 sch message.

## 2017-03-01 NOTE — Progress Notes (Deleted)
REFERRING PROVIDER: Hoyt Koch, MD [place], [place] [address]  PRIMARY PROVIDER:  Hoyt Koch, MD  PRIMARY REASON FOR VISIT:  1. Malignant neoplasm of lower-inner quadrant of right breast of female, estrogen receptor positive ([place])   2. Family history of prostate cancer      HISTORY OF PRESENT ILLNESS:   Ms. Glendinning, a 62 y.o. female, was seen for a [place] cancer genetics consultation at the request of Dr. Sharlet Salina due to a {Personal/family:20331} history of cancer.  Ms. Goris presents to clinic today to discuss the possibility of a hereditary predisposition to cancer, genetic testing, and to further clarify her future cancer risks, as well as potential cancer risks for family members.   In ***, at the age of ***, Ms. Sundstrom was diagnosed with {CA PATHOLOGY:63853} of the {right left (wildcard):15202} {CA NLZJQ:73419}. This was treated with {CA TX:62360}.    *** Ms. Boutin is a 62 y.o. female with no personal history of cancer.    CANCER HISTORY:   No history exists.     HORMONAL RISK FACTORS:  Menarche was at age ***.  First live birth at age ***.  OCP use for approximately {Numbers 1-12 multi-select:20307} years.  Ovaries intact: {Yes/No-Ex:120004}.  Hysterectomy: {Yes/No-Ex:120004}.  Menopausal status: {Menopause:31378}.  HRT use: {Numbers 1-12 multi-select:20307} years. Colonoscopy: {Yes/No-Ex:120004}; {normal/abnormal/not examined:14677}. Mammogram within the last year: {Yes/No-Ex:120004}. Number of breast biopsies: {Numbers 1-12 multi-select:20307}. Up to date with pelvic exams:  {Yes/No-Ex:120004}. Any excessive radiation exposure in the past:  {Yes/No-Ex:120004}  Past Medical History:  Diagnosis Date  . Acute sinusitis, unspecified   . Anxiety   . Cancer ([facility]) 12/2016   right breast cancer  . Depression   . Family history of prostate cancer   . Hypertension   . Obesity, unspecified   . Pneumonia,  organism unspecified(486)     Past Surgical History:  Procedure Laterality Date  . BREAST LUMPECTOMY WITH RADIOACTIVE SEED AND SENTINEL LYMPH NODE BIOPSY Right 01/25/2017   Procedure: RIGHT BREAST LUMPECTOMY  WITH 2 RADIOACTIVE SEEDS AND SENTINEL LYMPH NODE BIOPSY;  Surgeon: Erroll Luna, MD;  Location: [facility];  Service: General;  Laterality: Right;  . WISDOM TOOTH EXTRACTION      Social History   Socioeconomic History  . Marital status: Single    Spouse name: Not on file  . Number of children: Not on file  . Years of education: Not on file  . Highest education level: Not on file  Social Needs  . Financial resource strain: Not on file  . Food insecurity - worry: Not on file  . Food insecurity - inability: Not on file  . Transportation needs - medical: Not on file  . Transportation needs - non-medical: Not on file  Occupational History  . Not on file  Tobacco Use  . Smoking status: Never Smoker  . Smokeless tobacco: Never Used  Substance and Sexual Activity  . Alcohol use: Yes    Comment: rare glass of wine  . Drug use: No  . Sexual activity: Yes    Partners: Male  Other Topics Concern  . Not on file  Social History Narrative   HSG, [employer], Master's degree in speech pathology, Master physiology. Married [phone] son 64; 1 daughter 59 - at [facility] (12/12). 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 Marriage is in good  Health   Resigned from her job March 2014     FAMILY HISTORY:  We obtained a detailed, 4-generation  family history.  Significant diagnoses are listed below: Family History  Problem Relation Age of Onset  . Hypertension Mother   . Hypothyroidism Mother   . Hyperlipidemia Mother   . Gout Mother   . Breast cancer Mother 66  . Heart disease Father   . Hypertension Father   . Diabetes Father   . Hypothyroidism Father   . Prostate cancer Father        dx late 20s  . Hypothyroidism Sister   . Cancer Sister         angiosarcoma - mets  . Esophageal cancer Maternal Uncle   . Stroke Paternal Aunt   . Stroke Paternal Uncle   . Heart attack Maternal Grandfather   . ALS Paternal Grandfather   . COPD Neg Hx   . Colon cancer Neg Hx   . Rectal cancer Neg Hx   . Stomach cancer Neg Hx     Ms. Lerette is {aware/unaware} of previous family history of genetic testing for hereditary cancer risks. Patient's maternal ancestors are of *** descent, and paternal ancestors are of *** descent. There {IS NO:12509} reported Ashkenazi Jewish ancestry. There {IS NO:12509} known consanguinity.  GENETIC COUNSELING ASSESSMENT: ALEXXA SABET is a 62 y.o. female with a {PERSONAL HISTORY} which is somewhat suggestive of a {DISEASE} and predisposition to cancer. We, therefore, discussed and recommended the following at today's visit.   DISCUSSION: We reviewed the characteristics, features and inheritance patterns of hereditary cancer syndromes. We also discussed genetic testing, including the appropriate family members to test, the process of testing, insurance coverage and turn-around-time for results. We discussed the implications of a negative, positive and/or variant of uncertain significant result. We recommended Ms. Burgert pursue genetic testing for the *** gene panel.   Based on Ms. Luty {Personal/family:20331} history of cancer, she meets medical criteria for genetic testing. Despite that she meets criteria, she may still have an out of pocket cost. We discussed that if her out of pocket cost for testing is over $100, the laboratory will call and confirm whether she wants to proceed with testing.  If the out of pocket cost of testing is less than $100 she will be billed by the genetic testing laboratory.   ***We reviewed the characteristics, features and inheritance patterns of hereditary cancer syndromes. We also discussed genetic testing, including the appropriate family members to test, the process of  testing, insurance coverage and turn-around-time for results. We discussed the implications of a negative, positive and/or variant of uncertain significant result. In order to get genetic test results in a timely manner so that Ms. Smoot can use these genetic test results for surgical decisions, we recommended Ms. Perrelli pursue genetic testing for the ***. If this test is negative, we then recommend Ms. Hasan pursue reflex genetic testing to the *** gene panel.   Based on Ms. Torelli {Personal/family:20331} history of cancer, she meets medical criteria for genetic testing. Despite that she meets criteria, she may still have an out of pocket cost. We discussed that if her out of pocket cost for testing is over $100, the laboratory will call and confirm whether she wants to proceed with testing.  If the out of pocket cost of testing is less than $100 she will be billed by the genetic testing laboratory.   ***We discussed with Ms. Frommelt that the family history is not highly consistent with a familial hereditary cancer syndrome, and we feel she is at low risk to harbor a gene mutation associated  with such a condition. Thus, we did not recommend any genetic testing, at this time, and recommended Ms. Castor continue to follow the cancer screening guidelines given by her primary healthcare provider.  ***In order to estimate her chance of having a {CA GENE:62345} mutation, we used statistical models ({GENMODELS:62370}) and laboratory data that take into account her personal medical history, family history and ancestry.  Because each model is different, there can be a lot of variability in the risks they give.  Therefore, these numbers must be considered a rough range and not a precise risk of having a {CA GENE:62345} mutation.  These models estimate that she has approximately a ***-***% chance of having a mutation. Based on this assessment of her family and personal history, genetic  testing {IS/ISNOT:34056} recommended.  ***Based on the patient's personal and family history, statistical models ({GENMODELS:62370})  and literature data were used to estimate her risk of developing {CA HX:54794}. These estimate her lifetime risk of developing {CA HX:54794} to be approximately ***% to ***%. This estimation does not take into account any genetic testing results.  The patient's lifetime breast cancer risk is a preliminary estimate based on available information using one of several models endorsed by the [place] (ACS). The ACS recommends consideration of breast MRI screening as an adjunct to mammography for patients at high risk (defined as 20% or greater lifetime risk). A more detailed breast cancer risk assessment can be considered, if clinically indicated.   ***Ms. Peaden has been determined to be at high risk for breast cancer.  Therefore, we recommend that annual screening with mammography and breast MRI begin at age 44, or 10 years prior to the age of breast cancer diagnosis in a relative (whichever is earlier).  We discussed that Ms. Ang should discuss her individual situation with her referring physician and determine a breast cancer screening plan with which they are both comfortable.    PLAN: After considering the risks, benefits, and limitations, Ms. Pipkins  provided informed consent to pursue genetic testing and the blood sample was sent to {Lab} Laboratories for analysis of the {test}. Results should be available within approximately {TAT TIME} weeks' time, at which point they will be disclosed by telephone to Ms. Corti, as will any additional recommendations warranted by these results. Ms. Fincher will receive a summary of her genetic counseling visit and a copy of her results once available. This information will also be available in Epic. We encouraged Ms. Hasler to remain in contact with cancer genetics annually so that we can  continuously update the family history and inform her of any changes in cancer genetics and testing that may be of benefit for her family. Ms. Frein questions were answered to her satisfaction today. Our contact information was provided should additional questions or concerns arise.  *** Despite our recommendation, Ms. Pagan did not wish to pursue genetic testing at today's visit. We understand this decision, and remain available to coordinate genetic testing at any time in the future. We; therefore, recommend Ms. Maguire continue to follow the cancer screening guidelines given by her primary healthcare provider.  ***Based on Ms. Barna family history, we recommended her ***, who was diagnosed with *** at age ***, have genetic counseling and testing. Ms. Biscardi will let us know if we can be of any assistance in coordinating genetic counseling and/or testing for this family member.   Lastly, we encouraged Ms. Monnier to remain in contact with cancer genetics annually so that we can continuously  update the family history and inform her of any changes in cancer genetics and testing that may be of benefit for this family.   Ms.  Lansdowne questions were answered to her satisfaction today. Our contact information was provided should additional questions or concerns arise. Thank you for the referral and allowing [name] to share in the care of your patient.   Karen P. Florene Glen, [place], [facility] Certified Genetic Counselor [email or web address] phone: [phone]  The patient was seen for a total of *** minutes in face-to-face genetic counseling.  This patient was discussed with Drs. Magrinat, Lindi Adie and/or Burr Medico who agrees with the above.    _______________________________________________________________________ For Office Staff:  Number of people involved in session: *** Was an Intern/ student involved with case: {YES/NO:63}

## 2017-03-02 ENCOUNTER — Other Ambulatory Visit: Payer: Self-pay | Admitting: Oncology

## 2017-03-02 ENCOUNTER — Encounter (HOSPITAL_COMMUNITY): Payer: Self-pay

## 2017-03-02 DIAGNOSIS — Z170 Estrogen receptor positive status [ER+]: Principal | ICD-10-CM

## 2017-03-02 DIAGNOSIS — C50311 Malignant neoplasm of lower-inner quadrant of right female breast: Secondary | ICD-10-CM

## 2017-03-02 MED ORDER — LORAZEPAM 0.5 MG PO TABS: 0.5000 mg | ORAL_TABLET | Freq: Every evening | ORAL | 0 refills | Status: DC | PRN

## 2017-03-02 MED ORDER — PROCHLORPERAZINE MALEATE 10 MG PO TABS: 10.0000 mg | ORAL_TABLET | Freq: Four times a day (QID) | ORAL | 1 refills | Status: DC | PRN

## 2017-03-02 MED ORDER — DEXAMETHASONE 4 MG PO TABS: ORAL_TABLET | ORAL | 1 refills | Status: DC

## 2017-03-02 MED ORDER — LIDOCAINE-PRILOCAINE 2.5-2.5 % EX CREA: TOPICAL_CREAM | CUTANEOUS | 3 refills | Status: DC

## 2017-03-02 NOTE — Progress Notes (Signed)
START ON PATHWAY REGIMEN - Breast   Doxorubicin + Cyclophosphamide [facility]):   A cycle is every 21 days:     Doxorubicin      Cyclophosphamide   **Always confirm dose/schedule in your pharmacy ordering system**    Paclitaxel 80 mg/m2 Weekly:   Administer weekly:     Paclitaxel   **Always confirm dose/schedule in your pharmacy ordering system**    Patient Characteristics: Postoperative without Neoadjuvant Therapy (Pathologic Staging), Invasive Disease, Adjuvant Therapy, HER2 Negative/Unknown/Equivocal, ER Positive, Node Negative, pT1a-c, pN0/N34m or pT2 or Higher, pN0, Oncotype High Risk (? 26) Therapeutic Status: Postoperative without Neoadjuvant Therapy (Pathologic Staging) AJCC Grade: G2 AJCC N Category: pN0 AJCC M Category: cM0 ER Status: Positive (+) AJCC 8 Stage Grouping: IA HER2 Status: Negative (-) Oncotype Dx Recurrence Score: 42 AJCC T Category: pT1c PR Status: Positive (+) Has this patient completed genomic testing<= Yes - Oncotype DX(R) Intent of Therapy: Curative Intent, Discussed with Patient

## 2017-03-02 NOTE — Progress Notes (Unsigned)
[facility]  [phone] [phone]) [phone]     ID: Shelby Payne DOB: 05-05-1955  MR#: [id number]  KGY#:[id number]  Patient Care Team: Hoyt Koch, MD as PCP - General (Internal Medicine) Bryce Kimble, Virgie Dad, MD as Consulting Physician (Oncology) Eppie Gibson, MD as Attending Physician (Radiation Oncology) Erroll Luna, MD as Consulting Physician (General Surgery) Christene Slates, MD as Physician Assistant (Radiology) OTHER MD:  CHIEF COMPLAINT: Estrogen receptor positive breast cancer  CURRENT TREATMENT: Adjuvant chemotherapy    HISTORY OF CURRENT ILLNESS: From the original intake note:  The patient had bilateral screening mammography at [facility] 12/28/2016.  There was a new mass in the right breast lower inner quadrant and some new grouped calcifications in the right breast same quadrant.  Accordingly the patient was called back for right diagnostic mammography and ultrasonography 01/09/2017.  The breast density was category A.  In the right breast lower inner quadrant there was a new irregular mass.  Again noted was a new group of heterogeneous calcifications in the same quadrant.  Ultrasound confirmed a 1.7 cm irregular mass in the right breast lower inner quadrant.  There were no abnormalities sonographically in the right axilla.  Taken together,  the mass plus calcifications measured up to 4 cm.  Accordingly on 01/11/2017 she underwent biopsy of the right breast mass in question, showing [phone]) invasive ductal carcinoma, grade 2, estrogen receptor 90% positive with strong staining intensity, progesterone receptor negative, with an Mib-1 of 20% and no HER-2 amplification, the signals ratio being 1.26 and the number per cell 2.20  The patient's subsequent history is as detailed below.  INTERVAL HISTORY: Shelby Payne returns today for a follow-up of her estrogen receptor positive breast cancer. She is accompanied by her husband and daughter.  Since her last visit here she underwent right lumpectomy and sentinel lymph node sampling.  The final pathology ([id number]) confirmed an invasive ductal carcinoma, grade 2, measuring 1.5 cm.  Margins were negative.  All 7 sentinel lymph nodes were clear.    Unfortunately the patient's Oncotype score was quite high, at 42%.  This predicts a 30% risk of recurrence outside the breast within 9 years if the patient's only systemic therapy is tamoxifen for 5 years.  It also predicts a significant benefit from chemotherapy.  She is here today to discuss those results.  REVIEW OF SYSTEMS: Shelby Payne is doing well, overall. She tolerated surgery well and reports mild soreness at the beginning, but it has since resolved. She denies unusual headaches, visual changes, nausea, vomiting, or dizziness. There has been no unusual cough, phlegm production, or pleurisy. This been no change in bowel or bladder habits. She denies unexplained fatigue or unexplained weight loss, bleeding, rash, or fever. A detailed review of systems was otherwise noncontributory.    PAST MEDICAL HISTORY: Past Medical History:  Diagnosis Date  . Acute sinusitis, unspecified   . Anxiety   . Cancer ([place]) 12/2016   right breast cancer  . Depression   . Family history of prostate cancer   . Hypertension   . Obesity, unspecified   . Pneumonia, organism unspecified(486)     PAST SURGICAL HISTORY: Past Surgical History:  Procedure Laterality Date  . BREAST LUMPECTOMY WITH RADIOACTIVE SEED AND SENTINEL LYMPH NODE BIOPSY Right 01/25/2017   Procedure: RIGHT BREAST LUMPECTOMY  WITH 2 RADIOACTIVE SEEDS AND SENTINEL LYMPH NODE BIOPSY;  Surgeon: Erroll Luna, MD;  Location: [facility];  Service: General;  Laterality: Right;  . WISDOM TOOTH EXTRACTION  FAMILY HISTORY Family History  Problem Relation Age of Onset  . Hypertension Mother   . Hypothyroidism Mother   . Hyperlipidemia Mother   . Gout Mother   . Breast  cancer Mother 14  . Heart disease Father   . Hypertension Father   . Diabetes Father   . Hypothyroidism Father   . Prostate cancer Father        dx late 27s  . Hypothyroidism Sister   . Cancer Sister        angiosarcoma - mets  . Esophageal cancer Maternal Uncle   . Stroke Paternal Aunt   . Stroke Paternal Uncle   . Heart attack Maternal Grandfather   . ALS Paternal Grandfather   . COPD Neg Hx   . Colon cancer Neg Hx   . Rectal cancer Neg Hx   . Stomach cancer Neg Hx   Her father passed away from a massive stroke at 49. Her mother passed away from complications of breast cancer at 30. She had been diagnosed age 54. The patient's sister had angiosarcoma at 57. There is no otherhistory of breast or ovarian cancer in this famiy. The patient is of Ashkenazi extraction  GYNECOLOGIC HISTORY:  Patient's last menstrual period was 07/12/2011.  Menarche age 64, first live borth age 18, she is GXP2. Menopause 2015, never used OCPs or HR   SOCIAL HISTORY:  She is a Electrical engineer at [employer]. Her husband, Shelby Payne was a Curator at [employer] in [place]' law, but is retired now. Her daughter Shelby Payne teaches nursing at [employer]. Shelby Payne is concerned that on her father's side a cousin has tested positive for a deleterious gene; she will try to bring [name] that information]. Son, [name] passed away. The patient does not have any grandchildren. She attends [employer].     ADVANCED DIRECTIVES:    HEALTH MAINTENANCE: Social History   Tobacco Use  . Smoking status: Never Smoker  . Smokeless tobacco: Never Used  Substance Use Topics  . Alcohol use: Yes    Comment: rare glass of wine  . Drug use: No     Colonoscopy: 2013  PAP:  Bone density:never   Allergies  Allergen Reactions  . Bactrim [Sulfamethoxazole-Trimethoprim] Rash  . Hctz [Hydrochlorothiazide] Other (See Comments)    Hyponatremia    Current Outpatient Medications  Medication Sig Dispense  Refill  . Coenzyme Q10 (CO Q-10) 200 MG CAPS Take by mouth.    [name] Kitchen HYDROcodone-acetaminophen (NORCO/VICODIN) 5-325 MG tablet Take 1-2 tablets by mouth every 6 (six) hours as needed for moderate pain. 15 tablet 0  . ibuprofen (ADVIL,MOTRIN) 800 MG tablet Take 1 tablet (800 mg total) by mouth every 8 (eight) hours as needed. 30 tablet 0  . metoprolol succinate (TOPROL-XL) 100 MG 24 hr tablet Take 1 tablet (100 mg total) daily by mouth. 90 tablet 3  . Omega-3 Fatty Acids (FISH OIL PO) Take by mouth.    . sertraline (ZOLOFT) 100 MG tablet Take 1 tablet (100 mg total) daily by mouth. 90 tablet 3  . Telmisartan-Amlodipine 40-5 MG TABS Take 1 tablet daily by mouth. 90 tablet 3  . triamcinolone cream (KENALOG) 0.1 % Apply 1 application 2 (two) times daily topically. 30 g 6   No current facility-administered medications for this visit.     OBJECTIVE: middle aged White woman who had some upper respiratory symptoms during exam today  There were no vitals filed for this visit.   There is no height or weight  on file to calculate BMI.   Wt Readings from Last 3 Encounters:  02/28/17 220 lb 8 oz (100 kg)  01/25/17 214 lb (97.1 kg)  01/18/17 219 lb 4.8 oz (99.5 kg)      ECOG FS:1 - Symptomatic but completely ambulatory  Sclerae unicteric, EOMs intact Oropharynx clear and moist No cervical or supraclavicular adenopathy Lungs no rales or rhonchi Heart regular rate and rhythm Abd soft, nontender, positive bowel sounds MSK no focal spinal tenderness, no upper extremity lymphedema Neuro: nonfocal, well oriented, appropriate affect Breasts: The right breast is status post lumpectomy.  The cosmetic result is excellent.  The incisions are healing nicely without erythema inflammation or dehiscence.  The left breast is benign.  Both axillae are benign.  LAB RESULTS:  CMP     Component Value Date/Time   NA 134 (L) 02/28/2017 1409   NA 137 01/18/2017 1238   K 4.1 02/28/2017 1409   K 4.1 01/18/2017 1238    CL 98 02/28/2017 1409   CO2 27 02/28/2017 1409   CO2 22 01/18/2017 1238   GLUCOSE 85 02/28/2017 1409   GLUCOSE 91 01/18/2017 1238   BUN 9 02/28/2017 1409   BUN 19.5 01/18/2017 1238   CREATININE 0.7 01/18/2017 1238   CALCIUM 9.9 02/28/2017 1409   CALCIUM 9.8 01/18/2017 1238   PROT 8.2 02/28/2017 1409   PROT 7.9 01/18/2017 1238   ALBUMIN 3.9 02/28/2017 1409   ALBUMIN 4.3 01/18/2017 1238   AST 12 02/28/2017 1409   AST 19 01/18/2017 1238   ALT 14 02/28/2017 1409   ALT 24 01/18/2017 1238   ALKPHOS 64 02/28/2017 1409   ALKPHOS 53 01/18/2017 1238   BILITOT 0.5 02/28/2017 1409   BILITOT 0.39 01/18/2017 1238   GFRNONAA >60 02/28/2017 1409   GFRAA >60 02/28/2017 1409    No results found for: TOTALPROTELP, ALBUMINELP, A1GS, A2GS, BETS, BETA2SER, GAMS, MSPIKE, SPEI  No results found for: KPAFRELGTCHN, LAMBDASER, KAPLAMBRATIO  Lab Results  Component Value Date   WBC 15.8 (H) 02/28/2017   NEUTROABS 10.8 (H) 02/28/2017   HGB 13.8 01/18/2017   HCT 38.5 02/28/2017   MCV 88.3 02/28/2017   PLT 240 02/28/2017    '@LASTCHEMISTRY'$ @  No results found for: LABCA2  No components found for: MGQQPY195  No results for input(s): INR in the last 168 hours.  No results found for: LABCA2  No results found for: KDT267  No results found for: TIW580  No results found for: DXI338  No results found for: CA2729  No components found for: HGQUANT  No results found for: CEA1 / No results found for: CEA1   No results found for: AFPTUMOR  No results found for: CHROMOGRNA  No results found for: PSA1  Orders Only on 02/28/2017  Component Date Value Ref Range Status  . Influenza A By PCR 02/28/2017 NEGATIVE  NEGATIVE Final  . Influenza B By PCR 02/28/2017 NEGATIVE  NEGATIVE Final   Comment: (NOTE) The Xpert Xpress Flu assay is intended as an aid in the diagnosis of  influenza and should not be used as a sole basis for treatment.  This  assay is FDA approved for nasopharyngeal swab  specimens only. Nasal  washings and aspirates are unacceptable for Xpert Xpress Flu testing. Performed at [facility], [place] [address], [place], [place] [address]   Appointment on 02/28/2017  Component Date Value Ref Range Status  . Sodium 02/28/2017 134* 136 - 145 mmol/L Final  . Potassium 02/28/2017 4.1  3.3 -  4.7 mmol/L Final  . Chloride 02/28/2017 98  98 - 109 mmol/L Final  . CO2 02/28/2017 27  22 - 29 mmol/L Final  . Glucose, Bld 02/28/2017 85  70 - 140 mg/dL Final  . BUN 02/28/2017 9  7 - 26 mg/dL Final  . Creatinine 02/28/2017 0.72  0.60 - 1.10 mg/dL Final  . Calcium 02/28/2017 9.9  8.4 - 10.4 mg/dL Final  . Total Protein 02/28/2017 8.2  6.4 - 8.3 g/dL Final  . Albumin 02/28/2017 3.9  3.5 - 5.0 g/dL Final  . AST 02/28/2017 12  5 - 34 U/L Final  . ALT 02/28/2017 14  0 - 55 U/L Final  . Alkaline Phosphatase 02/28/2017 64  40 - 150 U/L Final  . Total Bilirubin 02/28/2017 0.5  0.2 - 1.2 mg/dL Final  . GFR, Est Non Af Am 02/28/2017 >60  >60 mL/min Final  . GFR, Est AFR Am 02/28/2017 >60  >60 mL/min Final   Comment: (NOTE) The eGFR has been calculated using the CKD EPI equation. This calculation has not been validated in all clinical situations. eGFR's persistently <60 mL/min signify possible Chronic Kidney Disease.   Georgiann Hahn gap 02/28/2017 9  3 - 11 Final   Performed at [facility], [place] [address], [place], [place] [address]  . WBC Count 02/28/2017 15.8* 3.9 - 10.3 K/uL Final  . RBC 02/28/2017 4.36  3.70 - 5.45 MIL/uL Final  . Hemoglobin 02/28/2017 13.1  11.6 - 15.9 g/dL Final  . HCT 02/28/2017 38.5  34.8 - 46.6 % Final  . MCV 02/28/2017 88.3  79.5 - 101.0 fL Final  . MCH 02/28/2017 30.0  25.1 - 34.0 pg Final  . MCHC 02/28/2017 34.0  31.5 - 36.0 g/dL Final  . RDW 02/28/2017 13.3  11.2 - 16.1 % Final  . Platelet Count 02/28/2017 240  145 - 400 K/uL Final  . Neutrophils Relative % 02/28/2017 68  % Final  . Neutro Abs 02/28/2017  10.8* 1.5 - 6.5 K/uL Final  . Lymphocytes Relative 02/28/2017 21  % Final  . Lymphs Abs 02/28/2017 3.3  0.9 - 3.3 K/uL Final  . Monocytes Relative 02/28/2017 10  % Final  . Monocytes Absolute 02/28/2017 1.5* 0.1 - 0.9 K/uL Final  . Eosinophils Relative 02/28/2017 1  % Final  . Eosinophils Absolute 02/28/2017 0.2  0.0 - 0.5 K/uL Final  . Basophils Relative 02/28/2017 0  % Final  . Basophils Absolute 02/28/2017 0.0  0.0 - 0.1 K/uL Final   Performed at [facility], [place] [address] [place], [place] [address]    (this displays the last labs from the last 3 days)  No results found for: TOTALPROTELP, ALBUMINELP, A1GS, A2GS, BETS, BETA2SER, GAMS, MSPIKE, SPEI (this displays SPEP labs)  No results found for: KPAFRELGTCHN, LAMBDASER, KAPLAMBRATIO (kappa/lambda light chains)  No results found for: HGBA, HGBA2QUANT, HGBFQUANT, HGBSQUAN (Hemoglobinopathy evaluation)   No results found for: LDH  No results found for: IRON, TIBC, IRONPCTSAT (Iron and TIBC)  No results found for: FERRITIN  Urinalysis    Component Value Date/Time   COLORURINE Yellow 12/29/2008 0809   APPEARANCEUR CLEAR 12/29/2008 0809   LABSPEC 1.020 12/29/2008 0809   PHURINE 6.0 12/29/2008 0809   GLUCOSEU NEGATIVE 12/29/2008 0809   BILIRUBINUR NEGATIVE 12/29/2008 0809   KETONESUR NEGATIVE 12/29/2008 0809   UROBILINOGEN 0.2 12/29/2008 0809   NITRITE NEGATIVE 12/29/2008 0809   LEUKOCYTESUR NEGATIVE 12/29/2008 0809     STUDIES: Pathology discussed in detail  with the patient  ELIGIBLE FOR AVAILABLE RESEARCH PROTOCOL: UPBEAT, ASA: Referral placed 02/28/2017  ASSESSMENT: 62 y.o. [place] woman s/p right breast lower inner quadrant biopsy 01/11/2017 for a cT1c cN0, stage IA invasive ductal breast cancer, estrogen receptor positive, progesterone receptor negativem HER-2 not amplified, with an Mib-1 of 20%  (1) genetics testing pending  (2) status post right lumpectomy and sentinel lymph node  sampling 01/25/2017 for a pT1c pN0, stage Ia invasive ductal carcinoma, grade 2, with negative margins.  (3) Oncotype DX score of 42 predicts a 9-year risk of recurrence outside the breast of 30% if the patient's only systemic therapy is tamoxifen for 5 years.  It also predicts a significant chemotherapy benefit.  (4) adjuvant chemotherapy will consist of cyclophosphamide and doxorubicin in dose dense fashion x4 followed by weekly paclitaxel x12  (5) adjuvant radiation to follow therapy  (6) antiestrogens to start at the completion of local treatment  PLAN: I spent approximately an hour with Genora and her family going over her situation.  She understands she has a small breast cancer which is not very aggressive, and which had not spread to lymph nodes.  This is a stage I tumor.  Note for the last it is a very dangerous 1.  We would not know this if we had not sent the Oncotype.  This tells me that even with the best surgery radiation and antiestrogens, she would still have a 30% chance of this tumor coming back outside the breast, meaning as stage IV incurable disease within the next 9 years, unless she takes chemotherapy.  If she does take chemotherapy she will get a better than 15% reduction in that risk so her disease free survival will be 86% or more.  We then discussed standard chemotherapy which will consist of cyclophosphamide and doxorubicin in dose dense fashion x4 followed by paclitaxel.  She has a good understanding of the possible toxicity side effects and complications of these agents.  She would like to start on March 09, 2017 if she is going to continue to work, or more likely she will start on March 13, 2017 if she decides to go on leave.  I have scheduled her for an echocardiogram and to meet with our chemotherapy nurse.  She will have a port placed by Dr. Brantley Stage ASAP.  She is going to see me on 03/09/2017 to discuss management of nausea and other side effects and how to take  her supportive medications.  Treina has a good understanding of this plan.  She agrees with it.  She knows to call for any problems that may develop before her next visit.  Incidentally we did a swab for the flu today.  We also advised her not to proceed with the shingles vaccine at this point.  Johnpaul Gillentine, Virgie Dad, MD  03/02/17 10:35 AM Medical Oncology and Hematology [facility] [address] [place], [place] [address] Tel. [phone]    Fax. [phone]  This document serves as a record of services personally performed by Chauncey Cruel, MD. It was created on his behalf by Margit Banda, a trained medical scribe. The creation of this record is based on the scribe's personal observations and the provider's statements to them.  I have reviewed the above documentation for accuracy and completeness, and I agree with the above.

## 2017-03-02 NOTE — Progress Notes (Signed)
DISCONTINUE ON PATHWAY REGIMEN - Breast   Doxorubicin + Cyclophosphamide [facility]):   A cycle is every 21 days:     Doxorubicin      Cyclophosphamide   **Always confirm dose/schedule in your pharmacy ordering system**    Paclitaxel 80 mg/m2 Weekly:   Administer weekly:     Paclitaxel   **Always confirm dose/schedule in your pharmacy ordering system**    REASON: Other Reason PRIOR TREATMENT: [id number]: AC-T - [Doxorubicin + Cyclophosphamide q21 Days x 4 Cycles, Followed by Paclitaxel Weekly x 12 Weeks] TREATMENT RESPONSE: Unable to Evaluate  START ON PATHWAY REGIMEN - Breast   Dose-Dense AC q14 days:   A cycle is every 14 days:     Doxorubicin      Cyclophosphamide      Pegfilgrastim-xxxx   **Always confirm dose/schedule in your pharmacy ordering system**    Paclitaxel 80 mg/m2 Weekly:   Administer weekly:     Paclitaxel   **Always confirm dose/schedule in your pharmacy ordering system**    Patient Characteristics: Postoperative without Neoadjuvant Therapy (Pathologic Staging), Invasive Disease, Adjuvant Therapy, HER2 Negative/Unknown/Equivocal, ER Positive, Node Negative, pT1a-c, pN0/N50m or pT2 or Higher, pN0, Oncotype High Risk (? 26) Therapeutic Status: Postoperative without Neoadjuvant Therapy (Pathologic Staging) AJCC Grade: G2 AJCC N Category: pN0 AJCC M Category: cM0 ER Status: Positive (+) AJCC 8 Stage Grouping: IA HER2 Status: Negative (-) Oncotype Dx Recurrence Score: 42 AJCC T Category: pT1c PR Status: Positive (+) Has this patient completed genomic testing<= Yes - Oncotype DX(R) Intent of Therapy: Curative Intent, Discussed with Patient

## 2017-03-07 ENCOUNTER — Other Ambulatory Visit: Payer: BC Managed Care – PPO

## 2017-03-08 ENCOUNTER — Other Ambulatory Visit: Payer: Self-pay | Admitting: *Deleted

## 2017-03-08 NOTE — Progress Notes (Signed)
Shelby Payne  [phone] Fax:([phone]     ID: [facility]EREE [facility]B DOB: Oct 22, 1955  MR#: [id number]  [id number]#:[id number]  Patient Care Team: Hoyt Koch, MD as PCP - General (Internal Medicine) Magrinat, Virgie Dad, MD as Consulting Physician (Oncology) Eppie Gibson, MD as Attending Physician (Radiation Oncology) Erroll Luna, MD as Consulting Physician (General Surgery) Christene Slates, MD as Physician Assistant (Radiology) OTHER MD:  CHIEF COMPLAINT: Estrogen receptor positive breast cancer  CURRENT TREATMENT: Adjuvant chemotherapy    HISTORY OF CURRENT ILLNESS: From the original intake note:  The patient had bilateral screening mammography at [facility] 12/28/2016.  There was a new mass in the right breast lower inner quadrant and some new grouped calcifications in the right breast same quadrant.  Accordingly the patient was called back for right diagnostic mammography and ultrasonography 01/09/2017.  The breast density was category A.  In the right breast lower inner quadrant there was a new irregular mass.  Again noted was a new group of heterogeneous calcifications in the same quadrant.  Ultrasound confirmed a 1.7 cm irregular mass in the right breast lower inner quadrant.  There were no abnormalities sonographically in the right axilla.  Taken together,  the mass plus calcifications measured up to 4 cm.  Accordingly on 01/11/2017 she underwent biopsy of the right breast mass in question, showing [id number]) invasive ductal carcinoma, grade 2, estrogen receptor 90% positive with strong staining intensity, progesterone receptor negative, with an Mib-1 of 20% and no HER-2 amplification, the signals ratio being 1.26 and the number per cell 2.20  The patient's subsequent history is as detailed below.  INTERVAL HISTORY: Shelby Payne returns today for a follow-up of her estrogen receptor positive breast cancer.  She is scheduled to start her adjuvant  chemotherapy on 03/14/2017, and this will consist of cyclophosphamide and doxorubicin in dose dense fashion x4 followed by weekly paclitaxel x12.    She is here today to discuss supportive medications and how to take them.    She is scheduled for an echocardiogram later this morning.     REVIEW OF SYSTEMS: Shelby Payne reports having a sinus infection that was diagnosed last Wednesday, 03/01/2017. She was prescribed amoxicillin. She was able to go back to work on Monday, 03/06/2017. She still reports coughing up yellow phlegm. She has not had her port placed because she was sick. The port will be placed early Monday, 03/13/2017 morning. Calie has not been exercising much, but she reports about 5000 steps a day. She denies unusual headaches, visual changes, nausea, vomiting, or dizziness. There has been no unusual cough, phlegm production, or pleurisy. This been no change in bowel or bladder habits. She denies unexplained fatigue or unexplained weight loss, bleeding, rash, or fever. A detailed review of systems was otherwise noncontributory.    PAST MEDICAL HISTORY: Past Medical History:  Diagnosis Date  . Acute sinusitis, unspecified   . Anxiety   . Cancer ([place]) 12/2016   right breast cancer  . Depression   . Family history of prostate cancer   . Hypertension   . Obesity, unspecified   . Pneumonia, organism unspecified(486)     PAST SURGICAL HISTORY: Past Surgical History:  Procedure Laterality Date  . BREAST LUMPECTOMY WITH RADIOACTIVE SEED AND SENTINEL LYMPH NODE BIOPSY Right 01/25/2017   Procedure: RIGHT BREAST LUMPECTOMY  WITH 2 RADIOACTIVE SEEDS AND SENTINEL LYMPH NODE BIOPSY;  Surgeon: Erroll Luna, MD;  Location: [place];  Service: General;  Laterality: Right;  . WISDOM TOOTH EXTRACTION  FAMILY HISTORY Family History  Problem Relation Age of Onset  . Hypertension Mother   . Hypothyroidism Mother   . Hyperlipidemia Mother   . Gout Mother   . Breast cancer  Mother 75  . Heart disease Father   . Hypertension Father   . Diabetes Father   . Hypothyroidism Father   . Prostate cancer Father        dx late 4s  . Hypothyroidism Sister   . Cancer Sister        angiosarcoma - mets  . Esophageal cancer Maternal Uncle   . Stroke Paternal Aunt   . Stroke Paternal Uncle   . Heart attack Maternal Grandfather   . ALS Paternal Grandfather   . COPD Neg Hx   . Colon cancer Neg Hx   . Rectal cancer Neg Hx   . Stomach cancer Neg Hx   Her father passed away from a massive stroke at 73. Her mother passed away from complications of breast cancer at 60. She had been diagnosed age 64. The patient's sister had angiosarcoma at 75. There is no otherhistory of breast or ovarian cancer in this famiy. The patient is of Ashkenazi extraction  GYNECOLOGIC HISTORY:  Patient's last menstrual period was 07/12/2011.  Menarche age 18, first live borth age 76, she is GXP2. Menopause 2015, never used OCPs or HR   SOCIAL HISTORY:  She is a Electrical engineer at [employer]. Her husband, [name] was a Curator at [employer] in [place], but is retired now. Her daughter [name] teaches nursing at [employer]. [name] is concerned that on her father's side a cousin has tested positive for a deleterious gene; she will try to bring [name] that information]. Son, [name] passed away. The patient does not have any grandchildren. She attends [employer].     ADVANCED DIRECTIVES:    HEALTH MAINTENANCE: Social History   Tobacco Use  . Smoking status: Never Smoker  . Smokeless tobacco: Never Used  Substance Use Topics  . Alcohol use: Yes    Comment: rare glass of wine  . Drug use: No     Colonoscopy: 2013  PAP:  Bone density:never   Allergies  Allergen Reactions  . Bactrim [Sulfamethoxazole-Trimethoprim] Rash  . Hctz [Hydrochlorothiazide] Other (See Comments)    Hyponatremia    Current Outpatient Medications  Medication Sig Dispense Refill  .  amoxicillin-clavulanate (AUGMENTIN) 875-125 MG tablet Take 1 tablet by mouth 2 (two) times daily. For 10 days  0  . b complex vitamins tablet Take 1 tablet by mouth daily.    . Coenzyme Q10 (CO Q-10 PO) Take 1 capsule by mouth daily.     [name] Kitchen dexamethasone (DECADRON) 4 MG tablet Take 2 tablets by mouth once a day on the day after chemotherapy and then take 2 tablets two times a day for 2 days. Take with food. (Patient taking differently: Take 8 mg by mouth See admin instructions. Take 8 mg by mouth once a day on the day after chemotherapy and then take 8 mg two times a day for 2 days. Take with food.) 30 tablet 1  . lidocaine-prilocaine (EMLA) cream Apply to affected area once 30 g 3  . LORazepam (ATIVAN) 0.5 MG tablet Take 1 tablet (0.5 mg total) by mouth at bedtime as needed (Nausea or vomiting). 30 tablet 0  . metoprolol succinate (TOPROL-XL) 100 MG 24 hr tablet Take 1 tablet (100 mg total) daily by mouth. 90 tablet 3  . Omega-3 Fatty Acids (  FISH OIL PO) Take 1 capsule by mouth daily.     . prochlorperazine (COMPAZINE) 10 MG tablet Take 1 tablet (10 mg total) by mouth every 6 (six) hours as needed (Nausea or vomiting). 30 tablet 1  . sertraline (ZOLOFT) 100 MG tablet Take 1 tablet (100 mg total) daily by mouth. 90 tablet 3  . Telmisartan-Amlodipine 40-5 MG TABS Take 1 tablet daily by mouth. 90 tablet 3  . triamcinolone cream (KENALOG) 0.1 % Apply 1 application 2 (two) times daily topically. (Patient taking differently: Apply 1 application topically 2 (two) times daily as needed (for rash). ) 30 g 6  . HYDROcodone-acetaminophen (NORCO/VICODIN) 5-325 MG tablet Take 1-2 tablets by mouth every 6 (six) hours as needed for moderate pain. (Patient not taking: Reported on 03/02/2017) 15 tablet 0  . ibuprofen (ADVIL,MOTRIN) 800 MG tablet Take 1 tablet (800 mg total) by mouth every 8 (eight) hours as needed. (Patient not taking: Reported on 03/02/2017) 30 tablet 0   No current facility-administered medications  for this visit.     OBJECTIVE: middle aged White woman no acute distress  Vitals:   03/09/17 0825  BP: (!) 145/81  Pulse: 73  Resp: 18  Temp: 98.4 F (36.9 C)  SpO2: 98%     Body mass index is 38.3 kg/m.   Wt Readings from Last 3 Encounters:  03/09/17 223 lb 1.6 oz (101.2 kg)  02/28/17 220 lb 8 oz (100 kg)  01/25/17 214 lb (97.1 kg)      ECOG FS:0 - Asymptomatic  Sclerae unicteric, EOMs intact Oropharynx clear and moist No cervical or supraclavicular adenopathy Lungs no rales or rhonchi Heart regular rate and rhythm Abd soft, nontender, positive bowel sounds MSK no focal spinal tenderness, no upper extremity lymphedema Neuro: nonfocal, well oriented, appropriate affect Breasts: Right breast is status post lumpectomy.  The cosmetic result is excellent.  The left breast is unremarkable.  Both axillae are benign.   LAB RESULTS:  CMP     Component Value Date/Time   NA 134 (L) 02/28/2017 1409   NA 137 01/18/2017 1238   K 4.1 02/28/2017 1409   K 4.1 01/18/2017 1238   CL 98 02/28/2017 1409   CO2 27 02/28/2017 1409   CO2 22 01/18/2017 1238   GLUCOSE 85 02/28/2017 1409   GLUCOSE 91 01/18/2017 1238   BUN 9 02/28/2017 1409   BUN 19.5 01/18/2017 1238   CREATININE 0.7 01/18/2017 1238   CALCIUM 9.9 02/28/2017 1409   CALCIUM 9.8 01/18/2017 1238   PROT 8.2 02/28/2017 1409   PROT 7.9 01/18/2017 1238   ALBUMIN 3.9 02/28/2017 1409   ALBUMIN 4.3 01/18/2017 1238   AST 12 02/28/2017 1409   AST 19 01/18/2017 1238   ALT 14 02/28/2017 1409   ALT 24 01/18/2017 1238   ALKPHOS 64 02/28/2017 1409   ALKPHOS 53 01/18/2017 1238   BILITOT 0.5 02/28/2017 1409   BILITOT 0.39 01/18/2017 1238   GFRNONAA >60 02/28/2017 1409   GFRAA >60 02/28/2017 1409    No results found for: TOTALPROTELP, ALBUMINELP, A1GS, A2GS, BETS, BETA2SER, GAMS, MSPIKE, SPEI  No results found for: KPAFRELGTCHN, LAMBDASER, KAPLAMBRATIO  Lab Results  Component Value Date   WBC 15.8 (H) 02/28/2017   NEUTROABS  10.8 (H) 02/28/2017   HGB 13.8 01/18/2017   HCT 38.5 02/28/2017   MCV 88.3 02/28/2017   PLT 240 02/28/2017    '@LASTCHEMISTRY'$ @  No results found for: LABCA2  No components found for: [id number]  No results for input(s): INR  in the last 168 hours.  No results found for: LABCA2  No results found for: QVZ563  No results found for: OVF643  No results found for: PIR518  No results found for: CA2729  No components found for: HGQUANT  No results found for: CEA1 / No results found for: CEA1   No results found for: AFPTUMOR  No results found for: CHROMOGRNA  No results found for: PSA1  No visits with results within 3 Day(s) from this visit.  Latest known visit with results is:  Orders Only on 02/28/2017  Component Date Value Ref Range Status  . Influenza A By PCR 02/28/2017 NEGATIVE  NEGATIVE Final  . Influenza B By PCR 02/28/2017 NEGATIVE  NEGATIVE Final   Comment: (NOTE) The Xpert Xpress Flu assay is intended as an aid in the diagnosis of  influenza and should not be used as a sole basis for treatment.  This  assay is FDA approved for nasopharyngeal swab specimens only. Nasal  washings and aspirates are unacceptable for Xpert Xpress Flu testing. Performed at [facility], [address] [place], [place] [address]     (this displays the last labs from the last 3 days)  No results found for: TOTALPROTELP, ALBUMINELP, A1GS, A2GS, BETS, BETA2SER, GAMS, MSPIKE, SPEI (this displays SPEP labs)  No results found for: KPAFRELGTCHN, LAMBDASER, KAPLAMBRATIO (kappa/lambda light chains)  No results found for: HGBA, HGBA2QUANT, HGBFQUANT, HGBSQUAN (Hemoglobinopathy evaluation)   No results found for: LDH  No results found for: IRON, TIBC, IRONPCTSAT (Iron and TIBC)  No results found for: FERRITIN  Urinalysis    Component Value Date/Time   COLORURINE Yellow 12/29/2008 0809   APPEARANCEUR CLEAR 12/29/2008 0809   LABSPEC 1.020 12/29/2008 0809    PHURINE 6.0 12/29/2008 0809   GLUCOSEU NEGATIVE 12/29/2008 0809   BILIRUBINUR NEGATIVE 12/29/2008 0809   KETONESUR NEGATIVE 12/29/2008 0809   UROBILINOGEN 0.2 12/29/2008 0809   NITRITE NEGATIVE 12/29/2008 0809   LEUKOCYTESUR NEGATIVE 12/29/2008 0809     STUDIES: No results found.  ELIGIBLE FOR AVAILABLE RESEARCH PROTOCOL: UPBEAT, ASA: Referral placed 02/28/2017  ASSESSMENT: 62 y.o. [place] woman s/p right breast lower inner quadrant biopsy 01/11/2017 for a cT1c cN0, stage IA invasive ductal breast cancer, estrogen receptor positive, progesterone receptor negativem HER-2 not amplified, with an Mib-1 of 20%  (1) genetics testing pending  (2) status post right lumpectomy and sentinel lymph node sampling 01/25/2017 for a pT1c pN0, stage Ia invasive ductal carcinoma, grade 2, with negative margins.  (3) Oncotype DX score of 42 predicts a 9-year risk of recurrence outside the breast of 30% if the patient's only systemic therapy is tamoxifen for 5 years.  It also predicts a significant chemotherapy benefit.  (4) adjuvant chemotherapy will consist of cyclophosphamide and doxorubicin in dose dense fashion x4 starting 03/14/2017 followed by weekly paclitaxel x12  (5) adjuvant radiation to follow therapy  (6) antiestrogens to start at the completion of local treatment  PLAN: Kamara decided to take time off and may even end up retiring, so she can get through the chemotherapy and then the radiation.  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  Today we went over the possible toxicity side effects and complications of her treatment and also how to deal with them.  I gave her a "roadmap" on how to take her medications.  All the prescriptions have been put in.  I  advised her that the Claritin is over-the-counter and her husband may not have picked that up.  There has been a recent change in the Neulasta  dosing and some insurances are insisting we use a bio similar.  I have asked our pharmacy to look into that so she does not end up coming on day 2 to get her shot and find out it has not been approved  I have encouraged her to start an exercise program.  I wrote her a prescription for cranial prostheses x2 and gave her some instructions on how to start shopping for those.  That can be very expensive.  She really needs to make sure to find out what her insurance will pay.  Otherwise she will return 0204 to have her port placed.  They can leave the needle in so she does not have to be re-accessed on 0205 when she has her chemotherapy.  She will return the next day.  I will see her a week later to make sure she tolerated things well and of course we will go ahead and schedule her for the rest of her treatments at this point  She knows to call for any other problems that may develop before the next visit.  Magrinat, Virgie Dad, MD  03/09/17 8:41 AM Medical Oncology and Hematology [facility] [address] [place], [place] [address] Tel. [phone]    Fax. [phone]  This document serves as a record of services personally performed by Chauncey Cruel, MD. It was created on his behalf by Margit Banda, a trained medical scribe. The creation of this record is based on the scribe's personal observations and the provider's statements to them.  I have reviewed the above documentation for accuracy and completeness, and I agree with the above.

## 2017-03-09 ENCOUNTER — Other Ambulatory Visit: Payer: BC Managed Care – PPO

## 2017-03-09 ENCOUNTER — Telehealth: Payer: Self-pay | Admitting: Oncology

## 2017-03-09 ENCOUNTER — Encounter: Payer: Self-pay | Admitting: Pharmacist

## 2017-03-09 ENCOUNTER — Encounter: Payer: Self-pay | Admitting: *Deleted

## 2017-03-09 ENCOUNTER — Inpatient Hospital Stay: Payer: BC Managed Care – PPO | Admitting: Oncology

## 2017-03-09 ENCOUNTER — Inpatient Hospital Stay: Payer: BC Managed Care – PPO

## 2017-03-09 ENCOUNTER — Ambulatory Visit (HOSPITAL_COMMUNITY)
Admission: RE | Admit: 2017-03-09 | Discharge: 2017-03-09 | Disposition: A | Discharge status: Home / Self Care | Payer: BC Managed Care – PPO | Source: Ambulatory Visit | Attending: Oncology | Admitting: Oncology

## 2017-03-09 VITALS — BP 145/81 | HR 73 | Temp 98.4°F | Resp 18 | Ht 64.0 in | Wt 223.1 lb

## 2017-03-09 DIAGNOSIS — C50311 Malignant neoplasm of lower-inner quadrant of right female breast: Secondary | ICD-10-CM

## 2017-03-09 DIAGNOSIS — F320 Major depressive disorder, single episode, mild: Secondary | ICD-10-CM | POA: Diagnosis present

## 2017-03-09 DIAGNOSIS — Z170 Estrogen receptor positive status [ER+]: Secondary | ICD-10-CM

## 2017-03-09 DIAGNOSIS — I081 Rheumatic disorders of both mitral and tricuspid valves: Secondary | ICD-10-CM | POA: Diagnosis not present

## 2017-03-09 DIAGNOSIS — I10 Essential (primary) hypertension: Secondary | ICD-10-CM

## 2017-03-09 LAB — ECHOCARDIOGRAM COMPLETE
HEIGHTINCHES: 64 in
WEIGHTICAEL: 3569.6 [oz_av]

## 2017-03-09 NOTE — Progress Notes (Signed)
  Echocardiogram 2D Echocardiogram has been performed.  Merrie Roof F 03/09/2017, 10:04 AM

## 2017-03-09 NOTE — Telephone Encounter (Signed)
Scheduled appts per 1/31 los - patient did not want avs or calendar.

## 2017-03-10 ENCOUNTER — Encounter (HOSPITAL_COMMUNITY): Payer: Self-pay | Admitting: *Deleted

## 2017-03-10 ENCOUNTER — Other Ambulatory Visit: Payer: Self-pay

## 2017-03-10 NOTE — Progress Notes (Signed)
Gave information for preop instructions over the phone. No cardiologist  7 days prior to surgery STOP taking any Aspirin(unless otherwise instructed by your surgeon), Aleve, Naproxen, Ibuprofen, Motrin, Advil, Goody's, BC's, all herbal medications, fish oil, and all vitamins  Patient had no further questions Recent surgery at [facility]

## 2017-03-12 NOTE — Anesthesia Preprocedure Evaluation (Addendum)
Anesthesia Evaluation  Patient identified by MRN, date of birth, ID band Patient awake    Reviewed: Allergy & Precautions, NPO status , Patient's Chart, lab work & pertinent test results, reviewed documented beta blocker date and time   Airway Mallampati: II  TM Distance: >3 FB Neck ROM: Full    Dental no notable dental hx. (+) Teeth Intact, Chipped,  Veneers bilateral upper incisors:   Pulmonary neg pulmonary ROS,    Pulmonary exam normal breath sounds clear to auscultation       Cardiovascular hypertension, Pt. on medications and Pt. on home beta blockers Normal cardiovascular exam Rhythm:Regular Rate:Normal  03/09/17 Echo: Normal EF. Valves okay   Neuro/Psych PSYCHIATRIC DISORDERS Anxiety Depression negative neurological ROS     GI/Hepatic negative GI ROS, Neg liver ROS,   Endo/Other  Right breast Ca Obesity  Hyperlipidemia  Renal/GU negative Renal ROS  negative genitourinary   Musculoskeletal negative musculoskeletal ROS (+)   Abdominal (+) + obese,   Peds  Hematology negative hematology ROS (+)   Anesthesia Other Findings Chipped left upper central incisor veneer  Reproductive/Obstetrics                             Lab Results  Component Value Date   WBC 15.8 (H) 02/28/2017   HGB 13.8 01/18/2017   HCT 38.5 02/28/2017   MCV 88.3 02/28/2017   PLT 240 02/28/2017   Lab Results  Component Value Date   CREATININE 0.7 01/18/2017   BUN 9 02/28/2017   NA 134 (L) 02/28/2017   K 4.1 02/28/2017   CL 98 02/28/2017   CO2 27 02/28/2017    Anesthesia Physical  Anesthesia Plan  ASA: II  Anesthesia Plan: General   Post-op Pain Management:    Induction: Intravenous  PONV Risk Score and Plan: 3 and Ondansetron, Dexamethasone, Midazolam and Treatment may vary due to age or medical condition  Airway Management Planned: LMA  Additional Equipment:   Intra-op Plan:    Post-operative Plan: Extubation in OR  Informed Consent: I have reviewed the patients History and Physical, chart, labs and discussed the procedure including the risks, benefits and alternatives for the proposed anesthesia with the patient or authorized representative who has indicated his/her understanding and acceptance.   Dental advisory given  Plan Discussed with: CRNA, Anesthesiologist and Surgeon  Anesthesia Plan Comments:         Anesthesia Quick Evaluation

## 2017-03-13 ENCOUNTER — Ambulatory Visit (HOSPITAL_COMMUNITY): Payer: BC Managed Care – PPO | Admitting: Anesthesiology

## 2017-03-13 ENCOUNTER — Encounter (HOSPITAL_COMMUNITY): Payer: Self-pay | Admitting: *Deleted

## 2017-03-13 ENCOUNTER — Ambulatory Visit (HOSPITAL_COMMUNITY): Payer: BC Managed Care – PPO

## 2017-03-13 ENCOUNTER — Ambulatory Visit (HOSPITAL_COMMUNITY)
Admission: RE | Admit: 2017-03-13 | Discharge: 2017-03-13 | Disposition: A | Discharge status: Home / Self Care | Payer: BC Managed Care – PPO | Source: Ambulatory Visit | Attending: Surgery | Admitting: Surgery

## 2017-03-13 ENCOUNTER — Encounter (HOSPITAL_COMMUNITY)
Admission: RE | Disposition: A | Discharge status: Home / Self Care | Payer: Self-pay | Source: Ambulatory Visit | Attending: Surgery

## 2017-03-13 ENCOUNTER — Other Ambulatory Visit: Payer: Self-pay

## 2017-03-13 DIAGNOSIS — Z803 Family history of malignant neoplasm of breast: Secondary | ICD-10-CM | POA: Diagnosis not present

## 2017-03-13 DIAGNOSIS — F419 Anxiety disorder, unspecified: Secondary | ICD-10-CM | POA: Diagnosis not present

## 2017-03-13 DIAGNOSIS — E669 Obesity, unspecified: Secondary | ICD-10-CM | POA: Diagnosis not present

## 2017-03-13 DIAGNOSIS — Z8349 Family history of other endocrine, nutritional and metabolic diseases: Secondary | ICD-10-CM | POA: Insufficient documentation

## 2017-03-13 DIAGNOSIS — Z79899 Other long term (current) drug therapy: Secondary | ICD-10-CM | POA: Insufficient documentation

## 2017-03-13 DIAGNOSIS — C50919 Malignant neoplasm of unspecified site of unspecified female breast: Secondary | ICD-10-CM | POA: Diagnosis present

## 2017-03-13 DIAGNOSIS — E785 Hyperlipidemia, unspecified: Secondary | ICD-10-CM | POA: Diagnosis not present

## 2017-03-13 DIAGNOSIS — Z882 Allergy status to sulfonamides status: Secondary | ICD-10-CM | POA: Diagnosis not present

## 2017-03-13 DIAGNOSIS — I10 Essential (primary) hypertension: Secondary | ICD-10-CM | POA: Insufficient documentation

## 2017-03-13 DIAGNOSIS — Z8042 Family history of malignant neoplasm of prostate: Secondary | ICD-10-CM | POA: Diagnosis not present

## 2017-03-13 DIAGNOSIS — Z800 Family history of malignant neoplasm of digestive organs: Secondary | ICD-10-CM | POA: Insufficient documentation

## 2017-03-13 DIAGNOSIS — Z888 Allergy status to other drugs, medicaments and biological substances status: Secondary | ICD-10-CM | POA: Insufficient documentation

## 2017-03-13 DIAGNOSIS — Z833 Family history of diabetes mellitus: Secondary | ICD-10-CM | POA: Diagnosis not present

## 2017-03-13 DIAGNOSIS — Z8249 Family history of ischemic heart disease and other diseases of the circulatory system: Secondary | ICD-10-CM | POA: Insufficient documentation

## 2017-03-13 DIAGNOSIS — C50911 Malignant neoplasm of unspecified site of right female breast: Secondary | ICD-10-CM | POA: Diagnosis not present

## 2017-03-13 DIAGNOSIS — Z6838 Body mass index (BMI) 38.0-38.9, adult: Secondary | ICD-10-CM | POA: Insufficient documentation

## 2017-03-13 DIAGNOSIS — F329 Major depressive disorder, single episode, unspecified: Secondary | ICD-10-CM | POA: Diagnosis not present

## 2017-03-13 DIAGNOSIS — Z820 Family history of epilepsy and other diseases of the nervous system: Secondary | ICD-10-CM | POA: Insufficient documentation

## 2017-03-13 DIAGNOSIS — Z95828 Presence of other vascular implants and grafts: Secondary | ICD-10-CM

## 2017-03-13 DIAGNOSIS — Z419 Encounter for procedure for purposes other than remedying health state, unspecified: Secondary | ICD-10-CM

## 2017-03-13 HISTORY — PX: PORTACATH PLACEMENT: SHX2246

## 2017-03-13 LAB — BASIC METABOLIC PANEL
ANION GAP: 14 (ref 5–15)
BUN: 10 mg/dL (ref 6–20)
CALCIUM: 9.6 mg/dL (ref 8.9–10.3)
CO2: 24 mmol/L (ref 22–32)
Chloride: 100 mmol/L — ABNORMAL LOW (ref 101–111)
Creatinine, Ser: 0.75 mg/dL (ref 0.44–1.00)
GLUCOSE: 106 mg/dL — AB (ref 65–99)
POTASSIUM: 4 mmol/L (ref 3.5–5.1)
Sodium: 138 mmol/L (ref 135–145)

## 2017-03-13 LAB — HEMOGLOBIN: HEMOGLOBIN: 12.6 g/dL (ref 12.0–15.0)

## 2017-03-13 SURGERY — INSERTION, TUNNELED CENTRAL VENOUS DEVICE, WITH PORT
Anesthesia: General | Site: Neck | Laterality: Right

## 2017-03-13 MED ORDER — GABAPENTIN 300 MG PO CAPS
ORAL_CAPSULE | ORAL | Status: AC
  Administered 2017-03-13: 300 mg via ORAL
  Filled 2017-03-13: qty 1

## 2017-03-13 MED ORDER — LIDOCAINE 2% (20 MG/ML) 5 ML SYRINGE
INTRAMUSCULAR | Status: AC
  Filled 2017-03-13: qty 10

## 2017-03-13 MED ORDER — DEXTROSE 5 % IV SOLN
3.0000 g | INTRAVENOUS | Status: DC
  Filled 2017-03-13: qty 3000

## 2017-03-13 MED ORDER — ONDANSETRON HCL 4 MG/2ML IJ SOLN
INTRAMUSCULAR | Status: AC
  Filled 2017-03-13: qty 4

## 2017-03-13 MED ORDER — PHENYLEPHRINE 40 MCG/ML (10ML) SYRINGE FOR IV PUSH (FOR BLOOD PRESSURE SUPPORT)
PREFILLED_SYRINGE | INTRAVENOUS | Status: DC | PRN
  Administered 2017-03-13: 40 ug via INTRAVENOUS
  Administered 2017-03-13 (×3): 80 ug via INTRAVENOUS
  Administered 2017-03-13: 40 ug via INTRAVENOUS

## 2017-03-13 MED ORDER — ONDANSETRON HCL 4 MG/2ML IJ SOLN
INTRAMUSCULAR | Status: DC | PRN
  Administered 2017-03-13: 4 mg via INTRAVENOUS

## 2017-03-13 MED ORDER — CHLORHEXIDINE GLUCONATE CLOTH 2 % EX PADS: 6.0000 | MEDICATED_PAD | Freq: Once | CUTANEOUS | Status: DC

## 2017-03-13 MED ORDER — FENTANYL CITRATE (PF) 100 MCG/2ML IJ SOLN: 25.0000 ug | INTRAMUSCULAR | Status: DC | PRN

## 2017-03-13 MED ORDER — HEPARIN SOD (PORK) LOCK FLUSH 100 UNIT/ML IV SOLN
INTRAVENOUS | Status: AC
  Filled 2017-03-13: qty 5

## 2017-03-13 MED ORDER — BUPIVACAINE-EPINEPHRINE 0.25% -1:200000 IJ SOLN: INTRAMUSCULAR | Status: DC | PRN

## 2017-03-13 MED ORDER — 0.9 % SODIUM CHLORIDE (POUR BTL) OPTIME
TOPICAL | Status: DC | PRN
  Administered 2017-03-13: 1000 mL

## 2017-03-13 MED ORDER — DEXAMETHASONE SODIUM PHOSPHATE 4 MG/ML IJ SOLN
INTRAMUSCULAR | Status: DC | PRN
  Administered 2017-03-13: 10 mg via INTRAVENOUS

## 2017-03-13 MED ORDER — PROPOFOL 10 MG/ML IV BOLUS
INTRAVENOUS | Status: AC
  Filled 2017-03-13: qty 40

## 2017-03-13 MED ORDER — MIDAZOLAM HCL 5 MG/5ML IJ SOLN
INTRAMUSCULAR | Status: DC | PRN
  Administered 2017-03-13: 2 mg via INTRAVENOUS

## 2017-03-13 MED ORDER — FENTANYL CITRATE (PF) 100 MCG/2ML IJ SOLN
INTRAMUSCULAR | Status: DC | PRN
  Administered 2017-03-13 (×3): 25 ug via INTRAVENOUS

## 2017-03-13 MED ORDER — BUPIVACAINE-EPINEPHRINE (PF) 0.5% -1:200000 IJ SOLN
INTRAMUSCULAR | Status: AC
  Filled 2017-03-13: qty 30

## 2017-03-13 MED ORDER — HYDROCODONE-ACETAMINOPHEN 5-325 MG PO TABS: 1.0000 | ORAL_TABLET | Freq: Four times a day (QID) | ORAL | 0 refills | Status: DC | PRN

## 2017-03-13 MED ORDER — LACTATED RINGERS IV SOLN
INTRAVENOUS | Status: DC | PRN
  Administered 2017-03-13: 07:00:00 via INTRAVENOUS

## 2017-03-13 MED ORDER — PROPOFOL 10 MG/ML IV BOLUS
INTRAVENOUS | Status: DC | PRN
  Administered 2017-03-13: 200 mg via INTRAVENOUS

## 2017-03-13 MED ORDER — LIDOCAINE 2% (20 MG/ML) 5 ML SYRINGE
INTRAMUSCULAR | Status: DC | PRN
  Administered 2017-03-13: 60 mg via INTRAVENOUS

## 2017-03-13 MED ORDER — ACETAMINOPHEN 500 MG PO TABS
1000.0000 mg | ORAL_TABLET | ORAL | Status: AC
  Administered 2017-03-13: 1000 mg via ORAL

## 2017-03-13 MED ORDER — DEXAMETHASONE SODIUM PHOSPHATE 10 MG/ML IJ SOLN
INTRAMUSCULAR | Status: AC
  Filled 2017-03-13: qty 2

## 2017-03-13 MED ORDER — GABAPENTIN 300 MG PO CAPS
300.0000 mg | ORAL_CAPSULE | ORAL | Status: AC
  Administered 2017-03-13: 300 mg via ORAL

## 2017-03-13 MED ORDER — ACETAMINOPHEN 500 MG PO TABS
ORAL_TABLET | ORAL | Status: AC
Start: 2017-03-13 — End: 2017-03-13
  Administered 2017-03-13: 1000 mg via ORAL
  Filled 2017-03-13: qty 2

## 2017-03-13 MED ORDER — FENTANYL CITRATE (PF) 250 MCG/5ML IJ SOLN
INTRAMUSCULAR | Status: AC
  Filled 2017-03-13: qty 5

## 2017-03-13 MED ORDER — SODIUM CHLORIDE 0.9 % IV SOLN
INTRAVENOUS | Status: DC | PRN
  Administered 2017-03-13: 07:00:00

## 2017-03-13 MED ORDER — ONDANSETRON HCL 4 MG/2ML IJ SOLN: 4.0000 mg | Freq: Once | INTRAMUSCULAR | Status: DC | PRN

## 2017-03-13 MED ORDER — CEFAZOLIN SODIUM-DEXTROSE 2-4 GM/100ML-% IV SOLN
2.0000 g | INTRAVENOUS | Status: AC
  Administered 2017-03-13: 2 g via INTRAVENOUS
  Filled 2017-03-13 (×2): qty 100

## 2017-03-13 MED ORDER — MIDAZOLAM HCL 2 MG/2ML IJ SOLN
INTRAMUSCULAR | Status: AC
  Filled 2017-03-13: qty 2

## 2017-03-13 MED ORDER — HEPARIN SOD (PORK) LOCK FLUSH 100 UNIT/ML IV SOLN
INTRAVENOUS | Status: DC | PRN
  Administered 2017-03-13: 500 [IU]

## 2017-03-13 MED ORDER — PHENYLEPHRINE 40 MCG/ML (10ML) SYRINGE FOR IV PUSH (FOR BLOOD PRESSURE SUPPORT)
PREFILLED_SYRINGE | INTRAVENOUS | Status: AC
  Filled 2017-03-13: qty 10

## 2017-03-13 MED ORDER — BUPIVACAINE-EPINEPHRINE (PF) 0.5% -1:200000 IJ SOLN
INTRAMUSCULAR | Status: DC | PRN
  Administered 2017-03-13: 20 mL

## 2017-03-13 SURGICAL SUPPLY — 46 items
ADH SKN CLS APL DERMABOND .7 (GAUZE/BANDAGES/DRESSINGS) ×1
BAG DECANTER FOR FLEXI CONT (MISCELLANEOUS) ×3 IMPLANT
BLADE SURG 11 STRL SS (BLADE) ×3 IMPLANT
BLADE SURG 15 STRL LF DISP TIS (BLADE) ×1 IMPLANT
BLADE SURG 15 STRL SS (BLADE) ×3
CHLORAPREP W/TINT 26ML (MISCELLANEOUS) ×3 IMPLANT
COVER SURGICAL LIGHT HANDLE (MISCELLANEOUS) ×3 IMPLANT
COVER TRANSDUCER ULTRASND GEL (DRAPE) ×3 IMPLANT
CRADLE DONUT ADULT HEAD (MISCELLANEOUS) ×3 IMPLANT
DERMABOND ADVANCED (GAUZE/BANDAGES/DRESSINGS) ×2
DERMABOND ADVANCED .7 DNX12 (GAUZE/BANDAGES/DRESSINGS) ×1 IMPLANT
DRAPE C-ARM 42X72 X-RAY (DRAPES) ×3 IMPLANT
DRAPE LAPAROSCOPIC ABDOMINAL (DRAPES) ×3 IMPLANT
DRAPE UTILITY XL STRL (DRAPES) ×3 IMPLANT
DRSG TEGADERM 4X4.75 (GAUZE/BANDAGES/DRESSINGS) ×2 IMPLANT
ELECT CAUTERY BLADE 6.4 (BLADE) ×3 IMPLANT
ELECT REM PT RETURN 9FT ADLT (ELECTROSURGICAL) ×3
ELECTRODE REM PT RTRN 9FT ADLT (ELECTROSURGICAL) ×1 IMPLANT
GAUZE SPONGE 4X4 12PLY STRL LF (GAUZE/BANDAGES/DRESSINGS) ×2 IMPLANT
GAUZE SPONGE 4X4 16PLY XRAY LF (GAUZE/BANDAGES/DRESSINGS) ×3 IMPLANT
GEL ULTRASOUND 20GR AQUASONIC (MISCELLANEOUS) ×2 IMPLANT
GLOVE BIO SURGEON STRL SZ8 (GLOVE) ×3 IMPLANT
GLOVE BIOGEL PI IND STRL 8 (GLOVE) ×1 IMPLANT
GLOVE BIOGEL PI INDICATOR 8 (GLOVE) ×2
GOWN STRL REUS W/ TWL LRG LVL3 (GOWN DISPOSABLE) ×1 IMPLANT
GOWN STRL REUS W/ TWL XL LVL3 (GOWN DISPOSABLE) ×1 IMPLANT
GOWN STRL REUS W/TWL LRG LVL3 (GOWN DISPOSABLE) ×3
GOWN STRL REUS W/TWL XL LVL3 (GOWN DISPOSABLE) ×3
KIT BASIN OR (CUSTOM PROCEDURE TRAY) ×3 IMPLANT
KIT PORT POWER 8FR ISP CVUE (Miscellaneous) ×2 IMPLANT
KIT ROOM TURNOVER OR (KITS) ×3 IMPLANT
NDL HYPO 25GX1X1/2 BEV (NEEDLE) ×1 IMPLANT
NEEDLE HYPO 25GX1X1/2 BEV (NEEDLE) ×3 IMPLANT
NS IRRIG 1000ML POUR BTL (IV SOLUTION) ×3 IMPLANT
PACK SURGICAL SETUP 50X90 (CUSTOM PROCEDURE TRAY) ×3 IMPLANT
PAD ARMBOARD 7.5X6 YLW CONV (MISCELLANEOUS) ×3 IMPLANT
PENCIL BUTTON HOLSTER BLD 10FT (ELECTRODE) ×3 IMPLANT
SUT MNCRL AB 4-0 PS2 18 (SUTURE) ×3 IMPLANT
SUT PROLENE 2 0 SH 30 (SUTURE) ×5 IMPLANT
SUT VIC AB 3-0 SH 27 (SUTURE) ×3
SUT VIC AB 3-0 SH 27X BRD (SUTURE) ×1 IMPLANT
SYR 20ML ECCENTRIC (SYRINGE) ×6 IMPLANT
SYR 5ML LUER SLIP (SYRINGE) ×3 IMPLANT
SYR CONTROL 10ML LL (SYRINGE) ×3 IMPLANT
TOWEL OR 17X24 6PK STRL BLUE (TOWEL DISPOSABLE) ×3 IMPLANT
TOWEL OR 17X26 10 PK STRL BLUE (TOWEL DISPOSABLE) ×3 IMPLANT

## 2017-03-13 NOTE — Anesthesia Postprocedure Evaluation (Signed)
Anesthesia Post Note  Patient: Shelby Payne  Procedure(s) Performed: ULTRASOUND GUIDED INSERTION PORT-A-CATH RIGHT INTERNAL JUGULAR (Right Neck)     Patient location during evaluation: PACU Anesthesia Type: General Level of consciousness: awake and alert Pain management: pain level controlled Vital Signs Assessment: post-procedure vital signs reviewed and stable Respiratory status: spontaneous breathing, nonlabored ventilation, respiratory function stable and patient connected to nasal cannula oxygen Cardiovascular status: blood pressure returned to baseline and stable Postop Assessment: no apparent nausea or vomiting Anesthetic complications: no    Last Vitals:  Vitals:   03/13/17 0915 03/13/17 0930  BP: 125/73 119/76  Pulse: 79 78  Resp: 14 14  Temp:  36.6 C  SpO2: 98% 96%    Last Pain:  Vitals:   03/13/17 0930  TempSrc:   PainSc: 0-No pain                 Tiajuana Amass

## 2017-03-13 NOTE — H&P (Signed)
Shelby Payne is an 62 y.o. female.   Chief Complaint: breast cancer HPI: Pt HERE FOR PORT PLACEMENT   Past Medical History:  Diagnosis Date  . Acute sinusitis, unspecified   . Anxiety   . Cancer ([place]) 12/2016   right breast cancer  . Depression   . Family history of prostate cancer   . Hypertension   . Obesity, unspecified   . Pneumonia, organism unspecified(486)     Past Surgical History:  Procedure Laterality Date  . BREAST LUMPECTOMY WITH RADIOACTIVE SEED AND SENTINEL LYMPH NODE BIOPSY Right 01/25/2017   Procedure: RIGHT BREAST LUMPECTOMY  WITH 2 RADIOACTIVE SEEDS AND SENTINEL LYMPH NODE BIOPSY;  Surgeon: Erroll Luna, MD;  Location: [place];  Service: General;  Laterality: Right;  . WISDOM TOOTH EXTRACTION      Family History  Problem Relation Age of Onset  . Hypertension Mother   . Hypothyroidism Mother   . Hyperlipidemia Mother   . Gout Mother   . Breast cancer Mother 69  . Heart disease Father   . Hypertension Father   . Diabetes Father   . Hypothyroidism Father   . Prostate cancer Father        dx late 29s  . Hypothyroidism Sister   . Cancer Sister        angiosarcoma - mets  . Esophageal cancer Maternal Uncle   . Stroke Paternal Aunt   . Stroke Paternal Uncle   . Heart attack Maternal Grandfather   . ALS Paternal Grandfather   . COPD Neg Hx   . Colon cancer Neg Hx   . Rectal cancer Neg Hx   . Stomach cancer Neg Hx    Social History:  reports that  has never smoked. she has never used smokeless tobacco. She reports that she drinks alcohol. She reports that she does not use drugs.  Allergies:  Allergies  Allergen Reactions  . Hctz [Hydrochlorothiazide] Other (See Comments)    Hyponatremia  . Bactrim [Sulfamethoxazole-Trimethoprim] Rash    Medications Prior to Admission  Medication Sig Dispense Refill  . amoxicillin-clavulanate (AUGMENTIN) 875-125 MG tablet Take 1 tablet by mouth 2 (two) times daily. For 10 days  0  . b  complex vitamins tablet Take 1 tablet by mouth daily.    . Coenzyme Q10 (CO Q-10 PO) Take 1 capsule by mouth daily.     . metoprolol succinate (TOPROL-XL) 100 MG 24 hr tablet Take 1 tablet (100 mg total) daily by mouth. 90 tablet 3  . Omega-3 Fatty Acids (FISH OIL PO) Take 1 capsule by mouth daily.     . sertraline (ZOLOFT) 100 MG tablet Take 1 tablet (100 mg total) daily by mouth. 90 tablet 3  . Telmisartan-Amlodipine 40-5 MG TABS Take 1 tablet daily by mouth. 90 tablet 3  . triamcinolone cream (KENALOG) 0.1 % Apply 1 application 2 (two) times daily topically. (Patient taking differently: Apply 1 application topically 2 (two) times daily as needed (for rash). ) 30 g 6  . dexamethasone (DECADRON) 4 MG tablet Take 2 tablets by mouth once a day on the day after chemotherapy and then take 2 tablets two times a day for 2 days. Take with food. (Patient taking differently: Take 8 mg by mouth See admin instructions. Take 8 mg by mouth once a day on the day after chemotherapy and then take 8 mg two times a day for 2 days. Take with food.) 30 tablet 1  . HYDROcodone-acetaminophen (NORCO/VICODIN) 5-325 MG tablet Take  1-2 tablets by mouth every 6 (six) hours as needed for moderate pain. (Patient not taking: Reported on 03/02/2017) 15 tablet 0  . ibuprofen (ADVIL,MOTRIN) 800 MG tablet Take 1 tablet (800 mg total) by mouth every 8 (eight) hours as needed. (Patient not taking: Reported on 03/02/2017) 30 tablet 0  . lidocaine-prilocaine (EMLA) cream Apply to affected area once 30 g 3  . LORazepam (ATIVAN) 0.5 MG tablet Take 1 tablet (0.5 mg total) by mouth at bedtime as needed (Nausea or vomiting). 30 tablet 0  . prochlorperazine (COMPAZINE) 10 MG tablet Take 1 tablet (10 mg total) by mouth every 6 (six) hours as needed (Nausea or vomiting). 30 tablet 1    No results found for this or any previous visit (from the past 48 hour(s)). No results found.  Review of Systems  Constitutional: Negative for chills and fever.   HENT: Negative for hearing loss.   Respiratory: Negative for cough.   Cardiovascular: Negative for chest pain.  Gastrointestinal: Negative for heartburn.  Genitourinary: Negative for dysuria.    Blood pressure (!) 159/80, pulse 68, temperature 98.2 F (36.8 C), temperature source Oral, resp. rate 18, height 5\' 4"  (1.626 m), weight 101.2 kg (223 lb), last menstrual period 07/12/2011, SpO2 98 %. Physical Exam  Constitutional: She appears well-developed and well-nourished.  HENT:  Head: Normocephalic.  Eyes: Pupils are equal, round, and reactive to light.  Neck: Normal range of motion.  Cardiovascular: Normal rate.  Respiratory: Effort normal.  Skin: Skin is warm and dry.     Assessment/Plan POOR VENOUS ACCESS  PORT PLACEMENT  The procedure has been discussed with the patient.  Alternative therapies have been discussed with the patient.  Operative risks include bleeding,  Infection,  Organ injury,  Nerve injury,  Blood vessel injury,  DVT,  COLLAPSE LUNG  BLEEDING  PORT FRAGMENTATION Pulmonary embolism,  Death,  And possible reoperation.  Medical management risks include worsening of present situation.  The success of the procedure is 50 -90 % at treating patients symptoms.  The patient understands and agrees to proceed. Turner Daniels, MD 03/13/2017, 7:02 AM

## 2017-03-13 NOTE — Discharge Instructions (Signed)
    PORT-A-CATH: POST OP INSTRUCTIONS  Always review your discharge instruction sheet given to you by the facility where your surgery was performed.   1. A prescription for pain medication may be given to you upon discharge. Take your pain medication as prescribed, if needed. If narcotic pain medicine is not needed, then you make take acetaminophen (Tylenol) or ibuprofen (Advil) as needed.  2. Take your usually prescribed medications unless otherwise directed. 3. If you need a refill on your pain medication, please contact our office. All narcotic pain medicine now requires a paper prescription.  Phoned in and fax refills are no longer allowed by law.  Prescriptions will not be filled after 5 pm or on weekends.  4. You should follow a light diet for the remainder of the day after your procedure. 5. Most patients will experience some mild swelling and/or bruising in the area of the incision. It may take several days to resolve. 6. It is common to experience some constipation if taking pain medication after surgery. Increasing fluid intake and taking a stool softener (such as Colace) will usually help or prevent this problem from occurring. A mild laxative (Milk of Magnesia or Miralax) should be taken according to package directions if there are no bowel movements after 48 hours.  7. Unless discharge instructions indicate otherwise, you may remove your bandages 48 hours after surgery, and you may shower at that time. You may have steri-strips (small white skin tapes) in place directly over the incision.  These strips should be left on the skin for 7-10 days.  If your surgeon used Dermabond (skin glue) on the incision, you may shower in 24 hours.  The glue will flake off over the next 2-3 weeks.  8. If your port is left accessed at the end of surgery (needle left in port), the dressing cannot get wet and should only by changed by a healthcare professional. When the port is no longer accessed (when the  needle has been removed), follow step 7.   9. ACTIVITIES:  Limit activity involving your arms for the next 72 hours. Do no strenuous exercise or activity for 1 week. You may drive when you are no longer taking prescription pain medication, you can comfortably wear a seatbelt, and you can maneuver your car. 10.You may need to see your doctor in the office for a follow-up appointment.  Please       check with your doctor.  11.When you receive a new Port-a-Cath, you will get a product guide and        ID card.  Please keep them in case you need them.  WHEN TO CALL YOUR DOCTOR ([phone]): 1. Fever over 101.0 2. Chills 3. Continued bleeding from incision 4. Increased redness and tenderness at the site 5. Shortness of breath, difficulty breathing   The clinic staff is available to answer your questions during regular business hours. Please don't hesitate to call and ask to speak to one of the nurses or medical assistants for clinical concerns. If you have a medical emergency, go to the nearest emergency room or call 911.  A surgeon from [facility] is always on call at the hospital.     For further information, please visit [email or web address]      

## 2017-03-13 NOTE — Op Note (Signed)
Preoperative diagnosis: PAC needed POOR VENOUS ACCESS  Postoperative diagnosis: Same  Procedure: Portacath Placement with C arm and U/S guidance   Surgeon: Turner Daniels, MD, FACS  Anesthesia: General and 0.25 % marcaine with epinephrine  Clinical History and Indications: The patient is getting ready to begin chemotherapy for her cancer. She  needs a Port-A-Cath for venous access. Risk of bleeding, infection,  Collapse lung,  Death,  DVT,  Organ injury,  Mediastinal injury,  Injury to heart,  Injury to blood vessels,  Nerves,  Migration of catheter,  Embolization of catheter and the need for more surgery.  Description of Procedure: I have seen the patient in the holding area and confirmed the plans for the procedure as noted above. I reviewed the risks and complications again and the patient has no further questions. She wishes to proceed.   The patient was then taken to the operating room. After satisfactory general  anesthesia had been obtained the upper chest and lower neck were prepped and draped as a sterile field. The timeout was done.  The right internal jugular vein  was entered under U/S guidance  and the guidewire threaded into the superior vena cava right atrial area under fluoroscopic guidance. An incision was then made on the anterior chest wall and a subcutaneous pocket fashioned for the port reservoir.  The port tubing was then brought through a subcutaneous tunnel from the port site to the guidewire site.  The port and catheter were attached, locked  and flushed. The catheter was measured and cut to appropriate length.The dilator and peel-away sheath were then advanced over the guidewire while monitoring this with fluoroscopy. The guidewire and dilator were removed and the tubing threaded to approximately 22 cm. The peel-away sheath was then removed. The catheter aspirated and flushed easily. Using fluoroscopy the tip was in the superior vena cava right atrial junction area. It  aspirated and flushed easily. That aspirated and flushed easily.  The reservoir was secured to the fascia with 1 sutures of 2-0 Prolene. A final check with fluoroscopy was done to make sure we had no kinks and good positioning of the tip of the catheter. Everything appeared to be okay. The catheter was aspirated, flushed with dilute heparin and then concentrated aqueous heparin.  The incision was then closed with interrupted 3-0 Vicryl, and 4-0 Monocryl subcuticular with Dermabond on the skin.  There were no operative complications. Estimated blood loss was minimal. All counts were correct. The patient tolerated the procedure well.  Turner Daniels, MD, FACS

## 2017-03-13 NOTE — Anesthesia Procedure Notes (Signed)
Procedure Name: LMA Insertion Date/Time: 03/13/2017 7:26 AM Performed by: Candis Shine, CRNA Pre-anesthesia Checklist: Patient identified, Emergency Drugs available, Suction available and Patient being monitored Patient Re-evaluated:Patient Re-evaluated prior to induction Oxygen Delivery Method: Circle System Utilized Preoxygenation: Pre-oxygenation with 100% oxygen Induction Type: IV induction Ventilation: Mask ventilation without difficulty LMA: LMA inserted LMA Size: 4.0 Number of attempts: 1 Placement Confirmation: positive ETCO2 Tube secured with: Tape Dental Injury: Teeth and Oropharynx as per pre-operative assessment

## 2017-03-13 NOTE — Interval H&P Note (Signed)
History and Physical Interval Note:  03/13/2017 7:04 AM  Shelby Payne  has presented today for surgery, with the diagnosis of BREAST CANCER  The various methods of treatment have been discussed with the patient and family. After consideration of risks, benefits and other options for treatment, the patient has consented to  Procedure(s): INSERTION PORT-A-CATH WITH [name] (N/A) as a surgical intervention .  The patient's history has been reviewed, patient examined, no change in status, stable for surgery.  I have reviewed the patient's chart and labs.  Questions were answered to the patient's satisfaction.     [place]

## 2017-03-13 NOTE — Transfer of Care (Signed)
Immediate Anesthesia Transfer of Care Note  Patient: Shelby Payne  Procedure(s) Performed: ULTRASOUND GUIDED INSERTION PORT-A-CATH RIGHT INTERNAL JUGULAR (Right Neck)  Patient Location: PACU  Anesthesia Type:General  Level of Consciousness: awake, alert  and oriented  Airway & Oxygen Therapy: Patient Spontanous Breathing and Patient connected to nasal cannula oxygen  Post-op Assessment: Report given to RN and Post -op Vital signs reviewed and stable  Post vital signs: Reviewed and stable  Last Vitals:  Vitals:   03/13/17 0621  BP: (!) 159/80  Pulse: 68  Resp: 18  Temp: 36.8 C  SpO2: 98%    Last Pain:  Vitals:   03/13/17 0621  TempSrc: Oral      Patients Stated Pain Goal: 4 (08/02/92 8546)  Complications: No apparent anesthesia complications

## 2017-03-14 ENCOUNTER — Encounter (HOSPITAL_COMMUNITY): Payer: Self-pay | Admitting: Surgery

## 2017-03-14 ENCOUNTER — Inpatient Hospital Stay: Payer: BC Managed Care – PPO | Attending: Oncology

## 2017-03-14 ENCOUNTER — Inpatient Hospital Stay: Payer: BC Managed Care – PPO

## 2017-03-14 VITALS — BP 134/80 | HR 68 | Temp 98.4°F | Resp 16 | Wt 226.8 lb

## 2017-03-14 DIAGNOSIS — D709 Neutropenia, unspecified: Secondary | ICD-10-CM | POA: Diagnosis not present

## 2017-03-14 DIAGNOSIS — Z5111 Encounter for antineoplastic chemotherapy: Secondary | ICD-10-CM | POA: Insufficient documentation

## 2017-03-14 DIAGNOSIS — C50311 Malignant neoplasm of lower-inner quadrant of right female breast: Secondary | ICD-10-CM

## 2017-03-14 DIAGNOSIS — Z170 Estrogen receptor positive status [ER+]: Secondary | ICD-10-CM | POA: Diagnosis not present

## 2017-03-14 DIAGNOSIS — Z95828 Presence of other vascular implants and grafts: Secondary | ICD-10-CM

## 2017-03-14 DIAGNOSIS — I10 Essential (primary) hypertension: Secondary | ICD-10-CM | POA: Diagnosis not present

## 2017-03-14 DIAGNOSIS — Z5189 Encounter for other specified aftercare: Secondary | ICD-10-CM | POA: Diagnosis not present

## 2017-03-14 MED ORDER — HEPARIN SOD (PORK) LOCK FLUSH 100 UNIT/ML IV SOLN
500.0000 [IU] | Freq: Once | INTRAVENOUS | Status: AC | PRN
  Administered 2017-03-14: 500 [IU]
  Filled 2017-03-14: qty 5

## 2017-03-14 MED ORDER — SODIUM CHLORIDE 0.9 % IV SOLN
Freq: Once | INTRAVENOUS | Status: AC
  Administered 2017-03-14: 09:00:00 via INTRAVENOUS

## 2017-03-14 MED ORDER — PALONOSETRON HCL INJECTION 0.25 MG/5ML
0.2500 mg | Freq: Once | INTRAVENOUS | Status: AC
  Administered 2017-03-14: 0.25 mg via INTRAVENOUS

## 2017-03-14 MED ORDER — SODIUM CHLORIDE 0.9% FLUSH
10.0000 mL | INTRAVENOUS | Status: DC | PRN
  Administered 2017-03-14: 10 mL via INTRAVENOUS
  Filled 2017-03-14: qty 10

## 2017-03-14 MED ORDER — SODIUM CHLORIDE 0.9% FLUSH
10.0000 mL | INTRAVENOUS | Status: DC | PRN
  Administered 2017-03-14: 10 mL
  Filled 2017-03-14: qty 10

## 2017-03-14 MED ORDER — DOXORUBICIN HCL CHEMO IV INJECTION 2 MG/ML
60.0000 mg/m2 | Freq: Once | INTRAVENOUS | Status: AC
  Administered 2017-03-14: 128 mg via INTRAVENOUS
  Filled 2017-03-14: qty 64

## 2017-03-14 MED ORDER — SODIUM CHLORIDE 0.9 % IV SOLN
600.0000 mg/m2 | Freq: Once | INTRAVENOUS | Status: AC
  Administered 2017-03-14: 1280 mg via INTRAVENOUS
  Filled 2017-03-14: qty 64

## 2017-03-14 MED ORDER — PALONOSETRON HCL INJECTION 0.25 MG/5ML
INTRAVENOUS | Status: AC
  Filled 2017-03-14: qty 5

## 2017-03-14 MED ORDER — SODIUM CHLORIDE 0.9 % IV SOLN
Freq: Once | INTRAVENOUS | Status: AC
  Administered 2017-03-14: 10:00:00 via INTRAVENOUS
  Filled 2017-03-14: qty 5

## 2017-03-14 NOTE — Patient Instructions (Signed)
[facility] Discharge Instructions for Patients Receiving Chemotherapy  Today you received the following chemotherapy agents: Adriamycin, Cytoxan  To help prevent nausea and vomiting after your treatment, we encourage you to take your nausea medication as directed.   If you develop nausea and vomiting that is not controlled by your nausea medication, call the clinic.   BELOW ARE SYMPTOMS THAT SHOULD BE REPORTED IMMEDIATELY:  *FEVER GREATER THAN 100.5 F  *CHILLS WITH OR WITHOUT FEVER  NAUSEA AND VOMITING THAT IS NOT CONTROLLED WITH YOUR NAUSEA MEDICATION  *UNUSUAL SHORTNESS OF BREATH  *UNUSUAL BRUISING OR BLEEDING  TENDERNESS IN MOUTH AND THROAT WITH OR WITHOUT PRESENCE OF ULCERS  *URINARY PROBLEMS  *BOWEL PROBLEMS  UNUSUAL RASH Items with * indicate a potential emergency and should be followed up as soon as possible.  Feel free to call the clinic should you have any questions or concerns. The clinic phone number is [phone].  Please show the CHEMO ALERT CARD at check-in to the Emergency Department and triage nurse.  Doxorubicin injection What is this medicine? DOXORUBICIN (dox oh ROO bi sin) is a chemotherapy drug. It is used to treat many kinds of cancer like leukemia, lymphoma, neuroblastoma, sarcoma, and Wilms' tumor. It is also used to treat bladder cancer, breast cancer, lung cancer, ovarian cancer, stomach cancer, and thyroid cancer. This medicine may be used for other purposes; ask your health care provider or pharmacist if you have questions. COMMON BRAND NAME(S): Adriamycin, Adriamycin PFS, Adriamycin RDF, Rubex What should I tell my health care provider before I take this medicine? They need to know if you have any of these conditions: -heart disease -history of low blood counts caused by a medicine -liver disease -recent or ongoing radiation therapy -an unusual or allergic reaction to doxorubicin, other chemotherapy agents, other medicines,  foods, dyes, or preservatives -pregnant or trying to get pregnant -breast-feeding How should I use this medicine? This drug is given as an infusion into a vein. It is administered in a hospital or clinic by a specially trained health care professional. If you have pain, swelling, burning or any unusual feeling around the site of your injection, tell your health care professional right away. Talk to your pediatrician regarding the use of this medicine in children. Special care may be needed. Overdosage: If you think you have taken too much of this medicine contact a poison control center or emergency room at once. NOTE: This medicine is only for you. Do not share this medicine with others. What if I miss a dose? It is important not to miss your dose. Call your doctor or health care professional if you are unable to keep an appointment. What may interact with this medicine? This medicine may interact with the following medications: -6-mercaptopurine -paclitaxel -phenytoin -St. John's Wort -trastuzumab -verapamil This list may not describe all possible interactions. Give your health care provider a list of all the medicines, herbs, non-prescription drugs, or dietary supplements you use. Also tell them if you smoke, drink alcohol, or use illegal drugs. Some items may interact with your medicine. What should I watch for while using this medicine? This drug may make you feel generally unwell. This is not uncommon, as chemotherapy can affect healthy cells as well as cancer cells. Report any side effects. Continue your course of treatment even though you feel ill unless your doctor tells you to stop. There is a maximum amount of this medicine you should receive throughout your life. The amount depends on the   medical condition being treated and your overall health. Your doctor will watch how much of this medicine you receive in your lifetime. Tell your doctor if you have taken this medicine before. You  may need blood work done while you are taking this medicine. Your urine may turn red for a few days after your dose. This is not blood. If your urine is dark or brown, call your doctor. In some cases, you may be given additional medicines to help with side effects. Follow all directions for their use. Call your doctor or health care professional for advice if you get a fever, chills or sore throat, or other symptoms of a cold or flu. Do not treat yourself. This drug decreases your body's ability to fight infections. Try to avoid being around people who are sick. This medicine may increase your risk to bruise or bleed. Call your doctor or health care professional if you notice any unusual bleeding. Talk to your doctor about your risk of cancer. You may be more at risk for certain types of cancers if you take this medicine. Do not become pregnant while taking this medicine or for 6 months after stopping it. Women should inform their doctor if they wish to become pregnant or think they might be pregnant. Men should not father a child while taking this medicine and for 6 months after stopping it. There is a potential for serious side effects to an unborn child. Talk to your health care professional or pharmacist for more information. Do not breast-feed an infant while taking this medicine. This medicine has caused ovarian failure in some women and reduced sperm counts in some men This medicine may interfere with the ability to have a child. Talk with your doctor or health care professional if you are concerned about your fertility. What side effects may I notice from receiving this medicine? Side effects that you should report to your doctor or health care professional as soon as possible: -allergic reactions like skin rash, itching or hives, swelling of the face, lips, or tongue -breathing problems -chest pain -fast or irregular heartbeat -low blood counts - this medicine may decrease the number of white  blood cells, red blood cells and platelets. You may be at increased risk for infections and bleeding. -pain, redness, or irritation at site where injected -signs of infection - fever or chills, cough, sore throat, pain or difficulty passing urine -signs of decreased platelets or bleeding - bruising, pinpoint red spots on the skin, black, tarry stools, blood in the urine -swelling of the ankles, feet, hands -tiredness -weakness Side effects that usually do not require medical attention (report to your doctor or health care professional if they continue or are bothersome): -diarrhea -hair loss -mouth sores -nail discoloration or damage -nausea -red colored urine -vomiting This list may not describe all possible side effects. Call your doctor for medical advice about side effects. You may report side effects to FDA at 1-800-FDA-1088. Where should I keep my medicine? This drug is given in a hospital or clinic and will not be stored at home. NOTE: This sheet is a summary. It may not cover all possible information. If you have questions about this medicine, talk to your doctor, pharmacist, or health care provider.  2018 Elsevier/Gold Standard (2015-03-23 11:28:51)  Cyclophosphamide injection What is this medicine? CYCLOPHOSPHAMIDE (sye kloe FOSS fa mide) is a chemotherapy drug. It slows the growth of cancer cells. This medicine is used to treat many types of cancer like lymphoma, myeloma,  leukemia, breast cancer, and ovarian cancer, to name a few. This medicine may be used for other purposes; ask your health care provider or pharmacist if you have questions. COMMON BRAND NAME(S): Cytoxan, Neosar What should I tell my health care provider before I take this medicine? They need to know if you have any of these conditions: -blood disorders -history of other chemotherapy -infection -kidney disease -liver disease -recent or ongoing radiation therapy -tumors in the bone marrow -an unusual or  allergic reaction to cyclophosphamide, other chemotherapy, other medicines, foods, dyes, or preservatives -pregnant or trying to get pregnant -breast-feeding How should I use this medicine? This drug is usually given as an injection into a vein or muscle or by infusion into a vein. It is administered in a hospital or clinic by a specially trained health care professional. Talk to your pediatrician regarding the use of this medicine in children. Special care may be needed. Overdosage: If you think you have taken too much of this medicine contact a poison control center or emergency room at once. NOTE: This medicine is only for you. Do not share this medicine with others. What if I miss a dose? It is important not to miss your dose. Call your doctor or health care professional if you are unable to keep an appointment. What may interact with this medicine? This medicine may interact with the following medications: -amiodarone -amphotericin B -azathioprine -certain antiviral medicines for HIV or AIDS such as protease inhibitors (e.g., indinavir, ritonavir) and zidovudine -certain blood pressure medications such as benazepril, captopril, enalapril, fosinopril, lisinopril, moexipril, monopril, perindopril, quinapril, ramipril, trandolapril -certain cancer medications such as anthracyclines (e.g., daunorubicin, doxorubicin), busulfan, cytarabine, paclitaxel, pentostatin, tamoxifen, trastuzumab -certain diuretics such as chlorothiazide, chlorthalidone, hydrochlorothiazide, indapamide, metolazone -certain medicines that treat or prevent blood clots like warfarin -certain muscle relaxants such as succinylcholine -cyclosporine -etanercept -indomethacin -medicines to increase blood counts like filgrastim, pegfilgrastim, sargramostim -medicines used as general anesthesia -metronidazole -natalizumab This list may not describe all possible interactions. Give your health care provider a list of all the  medicines, herbs, non-prescription drugs, or dietary supplements you use. Also tell them if you smoke, drink alcohol, or use illegal drugs. Some items may interact with your medicine. What should I watch for while using this medicine? Visit your doctor for checks on your progress. This drug may make you feel generally unwell. This is not uncommon, as chemotherapy can affect healthy cells as well as cancer cells. Report any side effects. Continue your course of treatment even though you feel ill unless your doctor tells you to stop. Drink water or other fluids as directed. Urinate often, even at night. In some cases, you may be given additional medicines to help with side effects. Follow all directions for their use. Call your doctor or health care professional for advice if you get a fever, chills or sore throat, or other symptoms of a cold or flu. Do not treat yourself. This drug decreases your body's ability to fight infections. Try to avoid being around people who are sick. This medicine may increase your risk to bruise or bleed. Call your doctor or health care professional if you notice any unusual bleeding. Be careful brushing and flossing your teeth or using a toothpick because you may get an infection or bleed more easily. If you have any dental work done, tell your dentist you are receiving this medicine. You may get drowsy or dizzy. Do not drive, use machinery, or do anything that needs mental alertness until  you know how this medicine affects you. Do not become pregnant while taking this medicine or for 1 year after stopping it. Women should inform their doctor if they wish to become pregnant or think they might be pregnant. Men should not father a child while taking this medicine and for 4 months after stopping it. There is a potential for serious side effects to an unborn child. Talk to your health care professional or pharmacist for more information. Do not breast-feed an infant while taking  this medicine. This medicine may interfere with the ability to have a child. This medicine has caused ovarian failure in some women. This medicine has caused reduced sperm counts in some men. You should talk with your doctor or health care professional if you are concerned about your fertility. If you are going to have surgery, tell your doctor or health care professional that you have taken this medicine. What side effects may I notice from receiving this medicine? Side effects that you should report to your doctor or health care professional as soon as possible: -allergic reactions like skin rash, itching or hives, swelling of the face, lips, or tongue -low blood counts - this medicine may decrease the number of white blood cells, red blood cells and platelets. You may be at increased risk for infections and bleeding. -signs of infection - fever or chills, cough, sore throat, pain or difficulty passing urine -signs of decreased platelets or bleeding - bruising, pinpoint red spots on the skin, black, tarry stools, blood in the urine -signs of decreased red blood cells - unusually weak or tired, fainting spells, lightheadedness -breathing problems -dark urine -dizziness -palpitations -swelling of the ankles, feet, hands -trouble passing urine or change in the amount of urine -weight gain -yellowing of the eyes or skin Side effects that usually do not require medical attention (report to your doctor or health care professional if they continue or are bothersome): -changes in nail or skin color -hair loss -missed menstrual periods -mouth sores -nausea, vomiting This list may not describe all possible side effects. Call your doctor for medical advice about side effects. You may report side effects to FDA at 1-800-FDA-1088. Where should I keep my medicine? This drug is given in a hospital or clinic and will not be stored at home. NOTE: This sheet is a summary. It may not cover all possible  information. If you have questions about this medicine, talk to your doctor, pharmacist, or health care provider.  2018 Elsevier/Gold Standard (2011-12-09 16:22:58)

## 2017-03-14 NOTE — Patient Instructions (Signed)

## 2017-03-14 NOTE — Progress Notes (Signed)
Discharge instructions printed and verbally reviewed with patient and spouse. Patient verbalized understanding. Questions encouraged. Patient denies questions or concerns at this time.

## 2017-03-14 NOTE — Progress Notes (Signed)
Ok to treat today with labs from 02/28/17 per Dr. Jana Hakim.  Cyndia Bent RN

## 2017-03-15 ENCOUNTER — Inpatient Hospital Stay: Payer: BC Managed Care – PPO

## 2017-03-15 ENCOUNTER — Telehealth: Payer: Self-pay

## 2017-03-15 DIAGNOSIS — C50311 Malignant neoplasm of lower-inner quadrant of right female breast: Secondary | ICD-10-CM

## 2017-03-15 DIAGNOSIS — Z170 Estrogen receptor positive status [ER+]: Principal | ICD-10-CM

## 2017-03-15 MED ORDER — PEGFILGRASTIM-CBQV 6 MG/0.6ML ~~LOC~~ SOSY
6.0000 mg | PREFILLED_SYRINGE | Freq: Once | SUBCUTANEOUS | Status: AC
  Administered 2017-03-15: 6 mg via SUBCUTANEOUS
  Filled 2017-03-15: qty 0.6

## 2017-03-15 NOTE — Telephone Encounter (Signed)
Left VM for patient. Calling to see how she was doing after her first infusion treatment yesterday. Pt is to call back with any questions or concerns.  Cyndia Bent RN

## 2017-03-15 NOTE — Telephone Encounter (Signed)
-----   Message from Smith Mince, [place] sent at 03/14/2017 12:11 PM EST ----- Regarding: [place]: First time chemo follow-up call First time Adriamycin/Cytoxan. Patient tolerated well.

## 2017-03-15 NOTE — Patient Instructions (Signed)
Pegfilgrastim injection What is this medicine? PEGFILGRASTIM (PEG fil gra stim) is a long-acting granulocyte colony-stimulating factor that stimulates the growth of neutrophils, a type of white blood cell important in the body's fight against infection. It is used to reduce the incidence of fever and infection in patients with certain types of cancer who are receiving chemotherapy that affects the bone marrow, and to increase survival after being exposed to high doses of radiation. This medicine may be used for other purposes; ask your health care provider or pharmacist if you have questions. COMMON BRAND NAME(S): Neulasta What should I tell my health care provider before I take this medicine? They need to know if you have any of these conditions: -kidney disease -latex allergy -ongoing radiation therapy -sickle cell disease -skin reactions to acrylic adhesives (On-Body Injector only) -an unusual or allergic reaction to pegfilgrastim, filgrastim, other medicines, foods, dyes, or preservatives -pregnant or trying to get pregnant -breast-feeding How should I use this medicine? This medicine is for injection under the skin. If you get this medicine at home, you will be taught how to prepare and give the pre-filled syringe or how to use the On-body Injector. Refer to the patient Instructions for Use for detailed instructions. Use exactly as directed. Tell your healthcare provider immediately if you suspect that the On-body Injector may not have performed as intended or if you suspect the use of the On-body Injector resulted in a missed or partial dose. It is important that you put your used needles and syringes in a special sharps container. Do not put them in a trash can. If you do not have a sharps container, call your pharmacist or healthcare provider to get one. Talk to your pediatrician regarding the use of this medicine in children. While this drug may be prescribed for selected conditions,  precautions do apply. Overdosage: If you think you have taken too much of this medicine contact a poison control center or emergency room at once. NOTE: This medicine is only for you. Do not share this medicine with others. What if I miss a dose? It is important not to miss your dose. Call your doctor or health care professional if you miss your dose. If you miss a dose due to an On-body Injector failure or leakage, a new dose should be administered as soon as possible using a single prefilled syringe for manual use. What may interact with this medicine? Interactions have not been studied. Give your health care provider a list of all the medicines, herbs, non-prescription drugs, or dietary supplements you use. Also tell them if you smoke, drink alcohol, or use illegal drugs. Some items may interact with your medicine. This list may not describe all possible interactions. Give your health care provider a list of all the medicines, herbs, non-prescription drugs, or dietary supplements you use. Also tell them if you smoke, drink alcohol, or use illegal drugs. Some items may interact with your medicine. What should I watch for while using this medicine? You may need blood work done while you are taking this medicine. If you are going to need a MRI, CT scan, or other procedure, tell your doctor that you are using this medicine (On-Body Injector only). What side effects may I notice from receiving this medicine? Side effects that you should report to your doctor or health care professional as soon as possible: -allergic reactions like skin rash, itching or hives, swelling of the face, lips, or tongue -dizziness -fever -pain, redness, or irritation at site   where injected -pinpoint red spots on the skin -red or dark-brown urine -shortness of breath or breathing problems -stomach or side pain, or pain at the shoulder -swelling -tiredness -trouble passing urine or change in the amount of urine Side  effects that usually do not require medical attention (report to your doctor or health care professional if they continue or are bothersome): -bone pain -muscle pain This list may not describe all possible side effects. Call your doctor for medical advice about side effects. You may report side effects to FDA at 1-800-FDA-1088. Where should I keep my medicine? Keep out of the reach of children. Store pre-filled syringes in a refrigerator between 2 and 8 degrees C (36 and 46 degrees F). Do not freeze. Keep in carton to protect from light. Throw away this medicine if it is left out of the refrigerator for more than 48 hours. Throw away any unused medicine after the expiration date. NOTE: This sheet is a summary. It may not cover all possible information. If you have questions about this medicine, talk to your doctor, pharmacist, or health care provider.  2018 Elsevier/Gold Standard (2016-01-21 12:58:03)  

## 2017-03-17 ENCOUNTER — Other Ambulatory Visit: Payer: Self-pay

## 2017-03-17 DIAGNOSIS — C50311 Malignant neoplasm of lower-inner quadrant of right female breast: Secondary | ICD-10-CM

## 2017-03-17 DIAGNOSIS — Z170 Estrogen receptor positive status [ER+]: Principal | ICD-10-CM

## 2017-03-20 ENCOUNTER — Inpatient Hospital Stay: Payer: BC Managed Care – PPO

## 2017-03-20 ENCOUNTER — Encounter: Payer: Self-pay | Admitting: Adult Health

## 2017-03-20 ENCOUNTER — Inpatient Hospital Stay (HOSPITAL_BASED_OUTPATIENT_CLINIC_OR_DEPARTMENT_OTHER): Payer: BC Managed Care – PPO | Admitting: Adult Health

## 2017-03-20 VITALS — BP 120/74 | HR 83 | Temp 98.9°F | Resp 18 | Ht 64.0 in | Wt 224.1 lb

## 2017-03-20 DIAGNOSIS — Z170 Estrogen receptor positive status [ER+]: Secondary | ICD-10-CM | POA: Diagnosis not present

## 2017-03-20 DIAGNOSIS — Z79810 Long term (current) use of selective estrogen receptor modulators (SERMs): Secondary | ICD-10-CM

## 2017-03-20 DIAGNOSIS — I10 Essential (primary) hypertension: Secondary | ICD-10-CM | POA: Diagnosis not present

## 2017-03-20 DIAGNOSIS — Z95828 Presence of other vascular implants and grafts: Secondary | ICD-10-CM

## 2017-03-20 DIAGNOSIS — C50311 Malignant neoplasm of lower-inner quadrant of right female breast: Secondary | ICD-10-CM

## 2017-03-20 LAB — CBC WITH DIFFERENTIAL (CANCER CENTER ONLY)
Basophils Absolute: 0 K/uL (ref 0.0–0.1)
Basophils Relative: 1 %
Eosinophils Absolute: 0.1 K/uL (ref 0.0–0.5)
Eosinophils Relative: 8 %
HCT: 34.5 % — ABNORMAL LOW (ref 34.8–46.6)
Hemoglobin: 11.7 g/dL (ref 11.6–15.9)
Lymphocytes Relative: 58 %
Lymphs Abs: 1 K/uL (ref 0.9–3.3)
MCH: 29.8 pg (ref 25.1–34.0)
MCHC: 33.9 g/dL (ref 31.5–36.0)
MCV: 87.8 fL (ref 79.5–101.0)
Monocytes Absolute: 0 K/uL — ABNORMAL LOW (ref 0.1–0.9)
Monocytes Relative: 2 %
Neutro Abs: 0.5 K/uL — CL (ref 1.5–6.5)
Neutrophils Relative %: 31 %
Platelet Count: 182 K/uL (ref 145–400)
RBC: 3.93 MIL/uL (ref 3.70–5.45)
RDW: 13.8 % (ref 11.2–14.5)
WBC Count: 1.7 K/uL — ABNORMAL LOW (ref 3.9–10.3)
nRBC: 0 /100{WBCs}

## 2017-03-20 LAB — CMP (CANCER CENTER ONLY)
ALBUMIN: 3.4 g/dL — AB (ref 3.5–5.0)
ALK PHOS: 78 U/L (ref 40–150)
ALT: 31 U/L (ref 0–55)
AST: 14 U/L (ref 5–34)
Anion gap: 8 (ref 3–11)
BILIRUBIN TOTAL: 0.7 mg/dL (ref 0.2–1.2)
BUN: 17 mg/dL (ref 7–26)
CALCIUM: 9 mg/dL (ref 8.4–10.4)
CO2: 26 mmol/L (ref 22–29)
Chloride: 101 mmol/L (ref 98–109)
Creatinine: 0.62 mg/dL (ref 0.60–1.10)
GFR, Est AFR Am: >60 mL/min (ref >60)
GFR, Estimated: >60 mL/min (ref >60)
Glucose, Bld: 110 mg/dL (ref 70–140)
POTASSIUM: 4.3 mmol/L (ref 3.5–5.1)
Sodium: 135 mmol/L — ABNORMAL LOW (ref 136–145)
TOTAL PROTEIN: 6.7 g/dL (ref 6.4–8.3)

## 2017-03-20 MED ORDER — SODIUM CHLORIDE 0.9% FLUSH
10.0000 mL | INTRAVENOUS | Status: DC | PRN
  Administered 2017-03-20: 10 mL
  Filled 2017-03-20: qty 10

## 2017-03-20 MED ORDER — HEPARIN SOD (PORK) LOCK FLUSH 100 UNIT/ML IV SOLN
500.0000 [IU] | Freq: Once | INTRAVENOUS | Status: AC | PRN
  Administered 2017-03-20: 500 [IU]
  Filled 2017-03-20: qty 5

## 2017-03-20 NOTE — Progress Notes (Signed)
[facility]  [phone] [phone]     ID: SHIORI ADCOX DOB: 1955/07/24  MR#: [id number]  WVP#:[id number]  Patient Care Team: Hoyt Koch, MD as PCP - General (Internal Medicine) Magrinat, Virgie Dad, MD as Consulting Physician (Oncology) Eppie Gibson, MD as Attending Physician (Radiation Oncology) Erroll Luna, MD as Consulting Physician (General Surgery) Christene Slates, MD as Physician Assistant (Radiology) OTHER MD:  CHIEF COMPLAINT: Estrogen receptor positive breast cancer  CURRENT TREATMENT: Adjuvant chemotherapy    HISTORY OF CURRENT ILLNESS: From the original intake note:  The patient had bilateral screening mammography at [facility] 12/28/2016.  There was a new mass in the right breast lower inner quadrant and some new grouped calcifications in the right breast same quadrant.  Accordingly the patient was called back for right diagnostic mammography and ultrasonography 01/09/2017.  The breast density was category A.  In the right breast lower inner quadrant there was a new irregular mass.  Again noted was a new group of heterogeneous calcifications in the same quadrant.  Ultrasound confirmed a 1.7 cm irregular mass in the right breast lower inner quadrant.  There were no abnormalities sonographically in the right axilla.  Taken together,  the mass plus calcifications measured up to 4 cm.  Accordingly on 01/11/2017 she underwent biopsy of the right breast mass in question, showing [phone]) invasive ductal carcinoma, grade 2, estrogen receptor 90% positive with strong staining intensity, progesterone receptor negative, with an Mib-1 of 20% and no HER-2 amplification, the signals ratio being 1.26 and the number per cell 2.20  The patient's subsequent history is as detailed below.  INTERVAL HISTORY: Averey returns today for a follow-up of her estrogen receptor positive breast cancer.  She is scheduled to start her adjuvant  chemotherapy on 03/14/2017, and this will consist of cyclophosphamide and doxorubicin in dose dense fashion x4 followed by weekly paclitaxel x12.    She is here today to discuss how she tolerated her treatment.  She is cycle 1 day 8 of Doxorubicin and Cyclophosphamide.     REVIEW OF SYSTEMS: Imanie tolerated treatment relatively well.  She has been nauseated.  She took her anti emetics as prescribed.  She did not vomit.  She has taken them over the weekend PRN as well.  She has noted some exhaustion and fatigue.  She has noted taste changes.  She has had about 2 loose BMs per day, no diarrhea, large volume, watery stools, or abdominal cramping with these. Her port is doing well.  She says she is adjusting to turning her head.  Other than that she denies any vision issues, chest pain, shortness of breath, bone pain, constipation, vomiting, cough, or any other concerns.  A Detailed ROS was otherwise neg.    PAST MEDICAL HISTORY: Past Medical History:  Diagnosis Date  . Acute sinusitis, unspecified   . Anxiety   . Cancer ([place]) 12/2016   right breast cancer  . Depression   . Family history of prostate cancer   . Hypertension   . Obesity, unspecified   . Pneumonia, organism unspecified(486)     PAST SURGICAL HISTORY: Past Surgical History:  Procedure Laterality Date  . BREAST LUMPECTOMY WITH RADIOACTIVE SEED AND SENTINEL LYMPH NODE BIOPSY Right 01/25/2017   Procedure: RIGHT BREAST LUMPECTOMY  WITH 2 RADIOACTIVE SEEDS AND SENTINEL LYMPH NODE BIOPSY;  Surgeon: Erroll Luna, MD;  Location: [facility];  Service: General;  Laterality: Right;  . PORTACATH PLACEMENT Right 03/13/2017   Procedure: ULTRASOUND GUIDED  INSERTION PORT-A-CATH RIGHT INTERNAL JUGULAR;  Surgeon: Erroll Luna, MD;  Location: [facility];  Service: General;  Laterality: Right;  . WISDOM TOOTH EXTRACTION      FAMILY HISTORY Family History  Problem Relation Age of Onset  . Hypertension Mother   . Hypothyroidism  Mother   . Hyperlipidemia Mother   . Gout Mother   . Breast cancer Mother 26  . Heart disease Father   . Hypertension Father   . Diabetes Father   . Hypothyroidism Father   . Prostate cancer Father        dx late 58s  . Hypothyroidism Sister   . Cancer Sister        angiosarcoma - mets  . Esophageal cancer Maternal Uncle   . Stroke Paternal Aunt   . Stroke Paternal Uncle   . Heart attack Maternal Grandfather   . ALS Paternal Grandfather   . COPD Neg Hx   . Colon cancer Neg Hx   . Rectal cancer Neg Hx   . Stomach cancer Neg Hx   Her father passed away from a massive stroke at 4. Her mother passed away from complications of breast cancer at 85. She had been diagnosed age 87. The patient's sister had angiosarcoma at 103. There is no otherhistory of breast or ovarian cancer in this famiy. The patient is of Ashkenazi extraction  GYNECOLOGIC HISTORY:  Patient's last menstrual period was 07/12/2011.  Menarche age 58, first live borth age 67, she is GXP2. Menopause 2015, never used OCPs or HR   SOCIAL HISTORY:  She is a Electrical engineer at [employer]. Her husband, [name] was a Curator at [employer] in [place]' law, but is retired now. Her daughter [name] teaches nursing at [employer]. [name] is concerned that on her father's side a cousin has tested positive for a deleterious gene; she will try to bring [name] that information]. Son, [name] passed away. The patient does not have any grandchildren. She attends [employer].     ADVANCED DIRECTIVES:    HEALTH MAINTENANCE: Social History   Tobacco Use  . Smoking status: Never Smoker  . Smokeless tobacco: Never Used  Substance Use Topics  . Alcohol use: Yes    Comment: rare glass of wine  . Drug use: No     Colonoscopy: 2013  PAP:  Bone density:never   Allergies  Allergen Reactions  . Hctz [Hydrochlorothiazide] Other (See Comments)    Hyponatremia  . Bactrim [Sulfamethoxazole-Trimethoprim] Rash     Current Outpatient Medications  Medication Sig Dispense Refill  . b complex vitamins tablet Take 1 tablet by mouth daily.    . Coenzyme Q10 (CO Q-10 PO) Take 1 capsule by mouth daily.     [name] Kitchen dexamethasone (DECADRON) 4 MG tablet Take 2 tablets by mouth once a day on the day after chemotherapy and then take 2 tablets two times a day for 2 days. Take with food. (Patient taking differently: Take 8 mg by mouth See admin instructions. Take 8 mg by mouth once a day on the day after chemotherapy and then take 8 mg two times a day for 2 days. Take with food.) 30 tablet 1  . HYDROcodone-acetaminophen (NORCO/VICODIN) 5-325 MG tablet Take 1 tablet by mouth every 6 (six) hours as needed for moderate pain. 12 tablet 0  . metoprolol succinate (TOPROL-XL) 100 MG 24 hr tablet Take 1 tablet (100 mg total) daily by mouth. 90 tablet 3  . Omega-3 Fatty Acids (FISH OIL PO) Take 1  capsule by mouth daily.     . prochlorperazine (COMPAZINE) 10 MG tablet Take 1 tablet (10 mg total) by mouth every 6 (six) hours as needed (Nausea or vomiting). 30 tablet 1  . sertraline (ZOLOFT) 100 MG tablet Take 1 tablet (100 mg total) daily by mouth. 90 tablet 3  . Telmisartan-Amlodipine 40-5 MG TABS Take 1 tablet daily by mouth. 90 tablet 3  . triamcinolone cream (KENALOG) 0.1 % Apply 1 application 2 (two) times daily topically. (Patient taking differently: Apply 1 application topically 2 (two) times daily as needed (for rash). ) 30 g 6  . ibuprofen (ADVIL,MOTRIN) 800 MG tablet Take 1 tablet (800 mg total) by mouth every 8 (eight) hours as needed. (Patient not taking: Reported on 03/02/2017) 30 tablet 0  . lidocaine-prilocaine (EMLA) cream Apply to affected area once (Patient not taking: Reported on 03/20/2017) 30 g 3  . LORazepam (ATIVAN) 0.5 MG tablet Take 1 tablet (0.5 mg total) by mouth at bedtime as needed (Nausea or vomiting). (Patient not taking: Reported on 03/20/2017) 30 tablet 0   No current facility-administered medications for  this visit.     OBJECTIVE:   Vitals:   03/20/17 1050  BP: 120/74  Pulse: 83  Resp: 18  Temp: 98.9 F (37.2 C)  SpO2: 98%     Body mass index is 38.47 kg/m.   Wt Readings from Last 3 Encounters:  03/20/17 224 lb 1.6 oz (101.7 kg)  03/14/17 226 lb 12 oz (102.9 kg)  03/13/17 223 lb (101.2 kg)      ECOG FS:0 - Asymptomatic GENERAL: Patient is a well appearing female in no acute distress HEENT:  Sclerae anicteric.  Oropharynx clear and moist. No ulcerations or evidence of oropharyngeal candidiasis. Neck is supple.  NODES:  No cervical, supraclavicular, or axillary lymphadenopathy palpated.  BREAST EXAM:  Deferred. LUNGS:  Clear to auscultation bilaterally.  No wheezes or rhonchi. HEART:  Regular rate and rhythm. No murmur appreciated. ABDOMEN:  Soft, nontender.  Positive, normoactive bowel sounds. No organomegaly palpated. MSK:  No focal spinal tenderness to palpation. Full range of motion bilaterally in the upper extremities. EXTREMITIES:  No peripheral edema.   SKIN:  Clear with no obvious rashes or skin changes. No nail dyscrasia. NEURO:  Nonfocal. Well oriented.  Appropriate affect.     LAB RESULTS:  CMP     Component Value Date/Time   NA 138 03/13/2017 0610   NA 137 01/18/2017 1238   K 4.0 03/13/2017 0610   K 4.1 01/18/2017 1238   CL 100 (L) 03/13/2017 0610   CO2 24 03/13/2017 0610   CO2 22 01/18/2017 1238   GLUCOSE 106 (H) 03/13/2017 0610   GLUCOSE 91 01/18/2017 1238   BUN 10 03/13/2017 0610   BUN 19.5 01/18/2017 1238   CREATININE 0.75 03/13/2017 0610   CREATININE 0.72 02/28/2017 1409   CREATININE 0.7 01/18/2017 1238   CALCIUM 9.6 03/13/2017 0610   CALCIUM 9.8 01/18/2017 1238   PROT 8.2 02/28/2017 1409   PROT 7.9 01/18/2017 1238   ALBUMIN 3.9 02/28/2017 1409   ALBUMIN 4.3 01/18/2017 1238   AST 12 02/28/2017 1409   AST 19 01/18/2017 1238   ALT 14 02/28/2017 1409   ALT 24 01/18/2017 1238   ALKPHOS 64 02/28/2017 1409   ALKPHOS 53 01/18/2017 1238    BILITOT 0.5 02/28/2017 1409   BILITOT 0.39 01/18/2017 1238   GFRNONAA >60 03/13/2017 0610   GFRNONAA >60 02/28/2017 1409   GFRAA >60 03/13/2017 0610   GFRAA >  60 02/28/2017 1409    No results found for: TOTALPROTELP, ALBUMINELP, A1GS, A2GS, BETS, BETA2SER, GAMS, MSPIKE, SPEI  No results found for: KPAFRELGTCHN, LAMBDASER, KAPLAMBRATIO  Lab Results  Component Value Date   WBC 1.7 (L) 03/20/2017   NEUTROABS 0.5 (LL) 03/20/2017   HGB 12.6 03/13/2017   HCT 34.5 (L) 03/20/2017   MCV 87.8 03/20/2017   PLT 182 03/20/2017    '@LASTCHEMISTRY'$ @  No results found for: LABCA2  No components found for: [id number]  No results for input(s): INR in the last 168 hours.  No results found for: LABCA2  No results found for: WFU932  No results found for: TFT732  No results found for: KGU542  No results found for: CA2729  No components found for: HGQUANT  No results found for: CEA1 / No results found for: CEA1   No results found for: AFPTUMOR  No results found for: CHROMOGRNA  No results found for: PSA1  Appointment on 03/20/2017  Component Date Value Ref Range Status  . WBC Count 03/20/2017 1.7* 3.9 - 10.3 K/uL Final  . RBC 03/20/2017 3.93  3.70 - 5.45 MIL/uL Final  . Hemoglobin 03/20/2017 11.7  11.6 - 15.9 g/dL Final  . HCT 03/20/2017 34.5* 34.8 - 46.6 % Final  . MCV 03/20/2017 87.8  79.5 - 101.0 fL Final  . MCH 03/20/2017 29.8  25.1 - 34.0 pg Final  . MCHC 03/20/2017 33.9  31.5 - 36.0 g/dL Final  . RDW 03/20/2017 13.8  11.2 - 14.5 % Final  . Platelet Count 03/20/2017 182  145 - 400 K/uL Final  . Neutrophils Relative % 03/20/2017 31  % Final  . Neutro Abs 03/20/2017 0.5* 1.5 - 6.5 K/uL Final  . Lymphocytes Relative 03/20/2017 58  % Final  . Lymphs Abs 03/20/2017 1.0  0.9 - 3.3 K/uL Final  . Monocytes Relative 03/20/2017 2  % Final  . Monocytes Absolute 03/20/2017 0.0* 0.1 - 0.9 K/uL Final  . Eosinophils Relative 03/20/2017 8  % Final  . Eosinophils Absolute 03/20/2017 0.1   0.0 - 0.5 K/uL Final  . Basophils Relative 03/20/2017 1  % Final  . Basophils Absolute 03/20/2017 0.0  0.0 - 0.1 K/uL Final  . nRBC 03/20/2017 0  0 /100 WBC Final   Performed at [facility], [address] [place], [place] [address]    (this displays the last labs from the last 3 days)  No results found for: TOTALPROTELP, ALBUMINELP, A1GS, A2GS, BETS, BETA2SER, GAMS, MSPIKE, SPEI (this displays SPEP labs)  No results found for: KPAFRELGTCHN, LAMBDASER, KAPLAMBRATIO (kappa/lambda light chains)  No results found for: HGBA, HGBA2QUANT, HGBFQUANT, HGBSQUAN (Hemoglobinopathy evaluation)   No results found for: LDH  No results found for: IRON, TIBC, IRONPCTSAT (Iron and TIBC)  No results found for: FERRITIN  Urinalysis    Component Value Date/Time   COLORURINE Yellow 12/29/2008 0809   APPEARANCEUR CLEAR 12/29/2008 0809   LABSPEC 1.020 12/29/2008 0809   PHURINE 6.0 12/29/2008 0809   GLUCOSEU NEGATIVE 12/29/2008 0809   BILIRUBINUR NEGATIVE 12/29/2008 0809   KETONESUR NEGATIVE 12/29/2008 0809   UROBILINOGEN 0.2 12/29/2008 0809   NITRITE NEGATIVE 12/29/2008 0809   LEUKOCYTESUR NEGATIVE 12/29/2008 0809     STUDIES: Dg Chest Port 1 View  Result Date: 03/13/2017 CLINICAL DATA:  Port-A-Cath placement EXAM: PORTABLE CHEST 1 VIEW COMPARISON:  None. FINDINGS: Right-sided Port-A-Cath from an internal jugular approach. This terminates at the mid SVC. Midline trachea. Normal heart size. No pleural effusion or pneumothorax. Clear lungs.  IMPRESSION: Right-sided Port-A-Cath, as detailed above. This terminates at the mid SVC. No pneumothorax or other acute complication. Electronically Signed   By: Abigail Miyamoto M.D.   On: 03/13/2017 09:24   Dg Fluoro Guide Cv Line-no Report  Result Date: 03/13/2017 Fluoroscopy was utilized by the requesting physician.  No radiographic interpretation.    ELIGIBLE FOR AVAILABLE RESEARCH PROTOCOL: UPBEAT, ASA: Referral placed  02/28/2017  ASSESSMENT: 62 y.o. [place] woman s/p right breast lower inner quadrant biopsy 01/11/2017 for a cT1c cN0, stage IA invasive ductal breast cancer, estrogen receptor positive, progesterone receptor negativem HER-2 not amplified, with an Mib-1 of 20%  (1) genetics testing pending  (2) status post right lumpectomy and sentinel lymph node sampling 01/25/2017 for a pT1c pN0, stage Ia invasive ductal carcinoma, grade 2, with negative margins.  (3) Oncotype DX score of 42 predicts a 9-year risk of recurrence outside the breast of 30% if the patient's only systemic therapy is tamoxifen for 5 years.  It also predicts a significant chemotherapy benefit.  (4) adjuvant chemotherapy will consist of cyclophosphamide and doxorubicin in dose dense fashion x4 starting 03/14/2017 followed by weekly paclitaxel x12  (5) adjuvant radiation to follow therapy  (6) antiestrogens to start at the completion of local treatment  PLAN: Ariani tolerated chemotherapy relatively well.  She has used her anti emetics as prescribed.  She is neutropenic.  I reviewed neutropenic precautions with her in detail.  She verbalized understanding of these instructions in detail.  We reviewed her side effects in detail today.  She is managing these quite well.    Ossie will return in one week for labs, f/u and her second cycle of Doxorubicin and Cyclophosphamide.  She knows to call for any other problems that may develop before the next visit.  A total of (30) minutes of face-to-face time was spent with this patient with greater than 50% of that time in counseling and care-coordination.  Wilber Bihari, NP  03/20/17 11:08 AM Medical Oncology and Hematology [facility] [address] [place], [place] [address] Tel. [phone]    Fax. [phone]

## 2017-03-21 ENCOUNTER — Telehealth: Payer: Self-pay | Admitting: Adult Health

## 2017-03-21 NOTE — Telephone Encounter (Signed)
Per 2/11 no los 

## 2017-03-22 ENCOUNTER — Encounter: Payer: Self-pay | Admitting: Oncology

## 2017-03-22 NOTE — Progress Notes (Signed)
Called patient to introduce myself as Arboriculturist and to see if she has any financial questions or concerns as well as to offer her available opportunities.  Discussed the copay assistance program for [facility] and how it will leave her with a $0 copay after her insurance pays. Asked patient if she would be interested in me applying on her behalf, she states yes and gave me permission.  Advised on PAF that they currently have funds available for her diagnosis, however they do have income guidelines and may audit. She states she had some questions regarding what her income was last year and may not be the same this year due to diagnosis. Provided her with the phone number to see if she qualifies and may apply over the phone. Advised her if she applies and is approved, to provide me with a copy of the approval letter to submit to billing. She verbalized understanding.   Discussed the one-time $1000 [employer] and the expenses it covers. Advised her this is based on income as well and she may apply at any time during treatment. After verbally giving her the income guidelines, she states they are over-income right now.   Gave her my name and number for any additional financial questions or concerns.

## 2017-03-22 NOTE — Progress Notes (Signed)
Enrolled patient online through [place] program for [place].

## 2017-03-23 ENCOUNTER — Telehealth: Payer: Self-pay | Admitting: Genetic Counselor

## 2017-03-23 ENCOUNTER — Encounter: Payer: Self-pay | Admitting: Genetic Counselor

## 2017-03-23 DIAGNOSIS — Z1379 Encounter for other screening for genetic and chromosomal anomalies: Secondary | ICD-10-CM | POA: Insufficient documentation

## 2017-03-23 NOTE — Progress Notes (Signed)
[facility]  [phone] [phone]     ID: Shelby Payne DOB: 22-Jul-1955  MR#: [id number]  LKG#:[id number]  Patient Care Team: Shelby Koch, MD as PCP - General (Internal Medicine) Shelby Payne, Shelby Dad, MD as Consulting Physician (Oncology) Shelby Gibson, MD as Attending Physician (Radiation Oncology) Shelby Luna, MD as Consulting Physician (General Surgery) Shelby Slates, MD as Physician Assistant (Radiology) OTHER MD:  CHIEF COMPLAINT: Estrogen receptor positive breast cancer  CURRENT TREATMENT: Adjuvant chemotherapy    HISTORY OF CURRENT ILLNESS: From the original intake note:  The patient had bilateral screening mammography at [facility] 12/28/2016.  There was a new mass in the right breast lower inner quadrant and some new grouped calcifications in the right breast same quadrant.  Accordingly the patient was called back for right diagnostic mammography and ultrasonography 01/09/2017.  The breast density was category A.  In the right breast lower inner quadrant there was a new irregular mass.  Again noted was a new group of heterogeneous calcifications in the same quadrant.  Ultrasound confirmed a 1.7 cm irregular mass in the right breast lower inner quadrant.  There were no abnormalities sonographically in the right axilla.  Taken together,  the mass plus calcifications measured up to 4 cm.  Accordingly on 01/11/2017 she underwent biopsy of the right breast mass in question, showing [phone]) invasive ductal carcinoma, grade 2, estrogen receptor 90% positive with strong staining intensity, progesterone receptor negative, with an Mib-1 of 20% and no HER-2 amplification, the signals ratio being 1.26 and the number per cell 2.20  The patient's subsequent history is as detailed below.  INTERVAL HISTORY: Shelby Payne returns today for a follow-up of her estrogen receptor positive breast cancer accompanied by her husband. Today is  Day 1 cycle 2  of Doxorubicin and Cyclophosphamide given on days 1 and 3 of every 14 day cycle, to be followed by weekly paclitaxel x 12. She notes that she took the decadron for a whole week for nausea. She notes that she took compazine for several nights, which aided her nausea. She notes that she felt fatigued about 4 days after treatment. She denies any hair loss so far, but she feels tingling and sensitivity in her scalp. She notes slight irritation in her mouth, and she relieved this with mouthwash.   REVIEW OF SYSTEMS: Shelby Payne reports that the last few days have been well. She notes that she wasn't able to walk outside, but she was able to complete her housework. She denies unusual headaches, visual changes, vomiting, or dizziness. There has been no unusual cough, phlegm production, or pleurisy. This been no change in bowel or bladder habits. She denies unexplained fatigue or unexplained weight loss, bleeding, rash, or fever. A detailed review of systems was otherwise stable.    PAST MEDICAL HISTORY: Past Medical History:  Diagnosis Date  . Acute sinusitis, unspecified   . Anxiety   . Cancer ([place]) 12/2016   right breast cancer  . Depression   . Family history of prostate cancer   . Hypertension   . Obesity, unspecified   . Pneumonia, organism unspecified(486)     PAST SURGICAL HISTORY: Past Surgical History:  Procedure Laterality Date  . BREAST LUMPECTOMY WITH RADIOACTIVE SEED AND SENTINEL LYMPH NODE BIOPSY Right 01/25/2017   Procedure: RIGHT BREAST LUMPECTOMY  WITH 2 RADIOACTIVE SEEDS AND SENTINEL LYMPH NODE BIOPSY;  Surgeon: Shelby Luna, MD;  Location: [facility];  Service: General;  Laterality: Right;  . PORTACATH PLACEMENT Right 03/13/2017  Procedure: ULTRASOUND GUIDED INSERTION PORT-A-CATH RIGHT INTERNAL JUGULAR;  Surgeon: Shelby Luna, MD;  Location: [facility];  Service: General;  Laterality: Right;  . WISDOM TOOTH EXTRACTION      FAMILY HISTORY Family History  Problem  Relation Age of Onset  . Hypertension Mother   . Hypothyroidism Mother   . Hyperlipidemia Mother   . Gout Mother   . Breast cancer Mother 32  . Heart disease Father   . Hypertension Father   . Diabetes Father   . Hypothyroidism Father   . Prostate cancer Father        dx late 47s  . Hypothyroidism Sister   . Cancer Sister        angiosarcoma - mets  . Esophageal cancer Maternal Uncle   . Stroke Paternal Aunt   . Stroke Paternal Uncle   . Heart attack Maternal Grandfather   . ALS Paternal Grandfather   . COPD Neg Hx   . Colon cancer Neg Hx   . Rectal cancer Neg Hx   . Stomach cancer Neg Hx   Her father passed away from a massive stroke at 51. Her mother passed away from complications of breast cancer at 61. She had been diagnosed age 91. The patient's sister had angiosarcoma at 47. There is no otherhistory of breast or ovarian cancer in this famiy. The patient is of Ashkenazi extraction  GYNECOLOGIC HISTORY:  Patient's last menstrual period was 07/12/2011.  Menarche age 3, first live birth age 44, she is GXP2. Menopause 2015, never used OCPs or HR   SOCIAL HISTORY:  She is a Electrical engineer at [employer]. Her husband, Shelby Payne was a Curator at [employer] in [place]' law, but is retired now. Her daughter Shelby Payne teaches nursing at [employer]. Shelby Payne is concerned that on her father's side a cousin has tested positive for a deleterious gene; she will try to bring [name] that information]. Son, Shelby Payne passed away. The patient does not have any grandchildren. She attends [employer].     ADVANCED DIRECTIVES:    HEALTH MAINTENANCE: Social History   Tobacco Use  . Smoking status: Never Smoker  . Smokeless tobacco: Never Used  Substance Use Topics  . Alcohol use: Yes    Comment: rare glass of wine  . Drug use: No     Colonoscopy: 2013  PAP:  Bone density:never   Allergies  Allergen Reactions  . Hctz [Hydrochlorothiazide] Other (See Comments)     Hyponatremia  . Bactrim [Sulfamethoxazole-Trimethoprim] Rash    Current Outpatient Medications  Medication Sig Dispense Refill  . b complex vitamins tablet Take 1 tablet by mouth daily.    . Coenzyme Q10 (CO Q-10 PO) Take 1 capsule by mouth daily.     [name] Kitchen dexamethasone (DECADRON) 4 MG tablet Take 2 tablets by mouth once a day on the day after chemotherapy and then take 2 tablets two times a day for 2 days. Take with food. (Patient taking differently: Take 8 mg by mouth See admin instructions. Take 8 mg by mouth once a day on the day after chemotherapy and then take 8 mg two times a day for 2 days. Take with food.) 30 tablet 1  . HYDROcodone-acetaminophen (NORCO/VICODIN) 5-325 MG tablet Take 1 tablet by mouth every 6 (six) hours as needed for moderate pain. 12 tablet 0  . ibuprofen (ADVIL,MOTRIN) 800 MG tablet Take 1 tablet (800 mg total) by mouth every 8 (eight) hours as needed. (Patient not taking: Reported on 03/02/2017) 30 tablet  0  . lidocaine-prilocaine (EMLA) cream Apply to affected area once (Patient not taking: Reported on 03/20/2017) 30 g 3  . LORazepam (ATIVAN) 0.5 MG tablet Take 1 tablet (0.5 mg total) by mouth at bedtime as needed (Nausea or vomiting). (Patient not taking: Reported on 03/20/2017) 30 tablet 0  . metoprolol succinate (TOPROL-XL) 100 MG 24 hr tablet Take 1 tablet (100 mg total) daily by mouth. 90 tablet 3  . Omega-3 Fatty Acids (FISH OIL PO) Take 1 capsule by mouth daily.     . prochlorperazine (COMPAZINE) 10 MG tablet Take 1 tablet (10 mg total) by mouth every 6 (six) hours as needed (Nausea or vomiting). 30 tablet 1  . sertraline (ZOLOFT) 100 MG tablet Take 1 tablet (100 mg total) daily by mouth. 90 tablet 3  . Telmisartan-Amlodipine 40-5 MG TABS Take 1 tablet daily by mouth. 90 tablet 3  . triamcinolone cream (KENALOG) 0.1 % Apply 1 application 2 (two) times daily topically. (Patient taking differently: Apply 1 application topically 2 (two) times daily as needed (for rash).  ) 30 g 6   No current facility-administered medications for this visit.     OBJECTIVE: Middle-aged white woman in no acute distress  Vitals:   03/28/17 0844  BP: 138/89  Pulse: 92  Resp: 17  Temp: 98.8 F (37.1 C)  SpO2: 97%     Body mass index is 39.31 kg/m.   Wt Readings from Last 3 Encounters:  03/28/17 229 lb (103.9 kg)  03/20/17 224 lb 1.6 oz (101.7 kg)  03/14/17 226 lb 12 oz (102.9 kg)      ECOG FS:0 - Asymptomatic  Sclerae unicteric, pupils round and equal Oropharynx clear and moist No cervical or supraclavicular adenopathy Lungs no rales or rhonchi Heart regular rate and rhythm Abd soft, nontender, positive bowel sounds MSK no focal spinal tenderness, no upper extremity lymphedema Neuro: nonfocal, well oriented, appropriate affect Breasts: Deferred     LAB RESULTS:  CMP     Component Value Date/Time   NA 135 (L) 03/20/2017 1012   NA 137 01/18/2017 1238   K 4.3 03/20/2017 1012   K 4.1 01/18/2017 1238   CL 101 03/20/2017 1012   CO2 26 03/20/2017 1012   CO2 22 01/18/2017 1238   GLUCOSE 110 03/20/2017 1012   GLUCOSE 91 01/18/2017 1238   BUN 17 03/20/2017 1012   BUN 19.5 01/18/2017 1238   CREATININE 0.62 03/20/2017 1012   CREATININE 0.7 01/18/2017 1238   CALCIUM 9.0 03/20/2017 1012   CALCIUM 9.8 01/18/2017 1238   PROT 6.7 03/20/2017 1012   PROT 7.9 01/18/2017 1238   ALBUMIN 3.4 (L) 03/20/2017 1012   ALBUMIN 4.3 01/18/2017 1238   AST 14 03/20/2017 1012   AST 19 01/18/2017 1238   ALT 31 03/20/2017 1012   ALT 24 01/18/2017 1238   ALKPHOS 78 03/20/2017 1012   ALKPHOS 53 01/18/2017 1238   BILITOT 0.7 03/20/2017 1012   BILITOT 0.39 01/18/2017 1238   GFRNONAA >60 03/20/2017 1012   GFRAA >60 03/20/2017 1012    No results found for: TOTALPROTELP, ALBUMINELP, A1GS, A2GS, BETS, BETA2SER, GAMS, MSPIKE, SPEI  No results found for: KPAFRELGTCHN, LAMBDASER, KAPLAMBRATIO  Lab Results  Component Value Date   WBC 1.7 (L) 03/20/2017   NEUTROABS 0.5  (LL) 03/20/2017   HGB 12.6 03/13/2017   HCT 34.5 (L) 03/20/2017   MCV 87.8 03/20/2017   PLT 182 03/20/2017    '@LASTCHEMISTRY'$ @  No results found for: LABCA2  No components found for:  LJQGBE010  No results for input(s): INR in the last 168 hours.  No results found for: LABCA2  No results found for: OFH219  No results found for: XJO832  No results found for: PQD826  No results found for: CA2729  No components found for: HGQUANT  No results found for: CEA1 / No results found for: CEA1   No results found for: AFPTUMOR  No results found for: CHROMOGRNA  No results found for: PSA1  No visits with results within 3 Day(s) from this visit.  Latest known visit with results is:  Appointment on 03/20/2017  Component Date Value Ref Range Status  . WBC Count 03/20/2017 1.7* 3.9 - 10.3 K/uL Final  . RBC 03/20/2017 3.93  3.70 - 5.45 MIL/uL Final  . Hemoglobin 03/20/2017 11.7  11.6 - 15.9 g/dL Final  . HCT 03/20/2017 34.5* 34.8 - 46.6 % Final  . MCV 03/20/2017 87.8  79.5 - 101.0 fL Final  . MCH 03/20/2017 29.8  25.1 - 34.0 pg Final  . MCHC 03/20/2017 33.9  31.5 - 36.0 g/dL Final  . RDW 03/20/2017 13.8  11.2 - 14.5 % Final  . Platelet Count 03/20/2017 182  145 - 400 K/uL Final  . Neutrophils Relative % 03/20/2017 31  % Final  . Neutro Abs 03/20/2017 0.5* 1.5 - 6.5 K/uL Final  . Lymphocytes Relative 03/20/2017 58  % Final  . Lymphs Abs 03/20/2017 1.0  0.9 - 3.3 K/uL Final  . Monocytes Relative 03/20/2017 2  % Final  . Monocytes Absolute 03/20/2017 0.0* 0.1 - 0.9 K/uL Final  . Eosinophils Relative 03/20/2017 8  % Final  . Eosinophils Absolute 03/20/2017 0.1  0.0 - 0.5 K/uL Final  . Basophils Relative 03/20/2017 1  % Final  . Basophils Absolute 03/20/2017 0.0  0.0 - 0.1 K/uL Final  . nRBC 03/20/2017 0  0 /100 WBC Final   Performed at [facility], [place] [address], [place], [place] [address]  . Sodium 03/20/2017 135* 136 - 145 mmol/L Final  . Potassium  03/20/2017 4.3  3.5 - 5.1 mmol/L Final  . Chloride 03/20/2017 101  98 - 109 mmol/L Final  . CO2 03/20/2017 26  22 - 29 mmol/L Final  . Glucose, Bld 03/20/2017 110  70 - 140 mg/dL Final  . BUN 03/20/2017 17  7 - 26 mg/dL Final  . Creatinine 03/20/2017 0.62  0.60 - 1.10 mg/dL Final  . Calcium 03/20/2017 9.0  8.4 - 10.4 mg/dL Final  . Total Protein 03/20/2017 6.7  6.4 - 8.3 g/dL Final  . Albumin 03/20/2017 3.4* 3.5 - 5.0 g/dL Final  . AST 03/20/2017 14  5 - 34 U/L Final  . ALT 03/20/2017 31  0 - 55 U/L Final  . Alkaline Phosphatase 03/20/2017 78  40 - 150 U/L Final  . Total Bilirubin 03/20/2017 0.7  0.2 - 1.2 mg/dL Final  . GFR, Est Non Af Am 03/20/2017 >60  >60 mL/min Final  . GFR, Est AFR Am 03/20/2017 >60  >60 mL/min Final   Comment: (NOTE) The eGFR has been calculated using the CKD EPI equation. This calculation has not been validated in all clinical situations. eGFR's persistently <60 mL/min signify possible Chronic Kidney Disease.   Georgiann Hahn gap 03/20/2017 8  3 - 11 Final   Performed at [facility], [place] [address] [place], [place] [address]    (this displays the last labs from the last 3 days)  No results found for: TOTALPROTELP, ALBUMINELP,  A1GS, A2GS, BETS, BETA2SER, GAMS, MSPIKE, SPEI (this displays SPEP labs)  No results found for: KPAFRELGTCHN, LAMBDASER, KAPLAMBRATIO (kappa/lambda light chains)  No results found for: HGBA, HGBA2QUANT, HGBFQUANT, HGBSQUAN (Hemoglobinopathy evaluation)   No results found for: LDH  No results found for: IRON, TIBC, IRONPCTSAT (Iron and TIBC)  No results found for: FERRITIN  Urinalysis    Component Value Date/Time   COLORURINE Yellow 12/29/2008 0809   APPEARANCEUR CLEAR 12/29/2008 0809   LABSPEC 1.020 12/29/2008 0809   PHURINE 6.0 12/29/2008 0809   GLUCOSEU NEGATIVE 12/29/2008 0809   BILIRUBINUR NEGATIVE 12/29/2008 0809   KETONESUR NEGATIVE 12/29/2008 0809   UROBILINOGEN 0.2 12/29/2008 0809   NITRITE  NEGATIVE 12/29/2008 0809   LEUKOCYTESUR NEGATIVE 12/29/2008 0809     STUDIES: Dg Chest Port 1 View  Result Date: 03/13/2017 CLINICAL DATA:  Port-A-Cath placement EXAM: PORTABLE CHEST 1 VIEW COMPARISON:  None. FINDINGS: Right-sided Port-A-Cath from an internal jugular approach. This terminates at the mid SVC. Midline trachea. Normal heart size. No pleural effusion or pneumothorax. Clear lungs. IMPRESSION: Right-sided Port-A-Cath, as detailed above. This terminates at the mid SVC. No pneumothorax or other acute complication. Electronically Signed   By: Abigail Miyamoto M.D.   On: 03/13/2017 09:24   Dg Fluoro Guide Cv Line-no Report  Result Date: 03/13/2017 Fluoroscopy was utilized by the requesting physician.  No radiographic interpretation.    ELIGIBLE FOR AVAILABLE RESEARCH PROTOCOL: UPBEAT, ASA: Referral placed 02/28/2017  ASSESSMENT: 62 y.o. Shelby Payne woman s/p right breast lower inner quadrant biopsy 01/11/2017 for a cT1c cN0, stage IA invasive ductal breast cancer, estrogen receptor positive, progesterone receptor negativem HER-2 not amplified, with an Mib-1 of 20%  (1) genetics testing pending  (2) status post right lumpectomy and sentinel lymph node sampling 01/25/2017 for a pT1c pN0, stage Ia invasive ductal carcinoma, grade 2, with negative margins.  (3) Oncotype DX score of 42 predicts a 9-year risk of recurrence outside the breast of 30% if the patient's only systemic therapy is tamoxifen for 5 years.  It also predicts a significant chemotherapy benefit.  (4) adjuvant chemotherapy will consist of cyclophosphamide and doxorubicin in dose dense fashion x4 starting 03/14/2017 followed by weekly paclitaxel x12  (5) adjuvant radiation to follow therapy  (6) antiestrogens to start at the completion of local treatment  PLAN: Shelby Payne will proceed to cycle 2 of her cyclophosphamide and doxorubicin treatments today.  She generally did well with cycle 1.  She does not seem to have an actual  mouth sores although she did have a sore mouth.  She has any problems in that regard she should call us and we will bring her in and look at it and if necessary start Valtrex or a different agent.  She understands of course that the dexamethasone is taken only for 2-1/2 days.  She may continue to take Compazine as needed.  She never did vomit with cycle 1 and hopefully cycle 2 will be even easier.  She understands she very likely will lose her hair in the next few days.  She tells me she already has a wig in place.  She was late coming in today so her labs will be drawn in the treatment area.  I will see her again in a week to make sure she tolerated cycle 2 without any unusual side effects  Chanz Cahall, Shelby Dad, MD  03/28/17 8:55 AM Medical Oncology and Hematology [facility] [address] [place], [place] [address] Tel. [phone]    Fax. [phone]  This document serves as a record of services personally performed by Arlander Gillen, MD. It was created on his behalf by Arielle Pollard, a trained medical scribe. The creation of this record is based on the scribe's personal observations and the provider's statements to them.   I have reviewed the above documentation for accuracy and completeness, and I agree with the above.     

## 2017-03-23 NOTE — Telephone Encounter (Signed)
LM on VM with good news.  I will CB later.

## 2017-03-28 ENCOUNTER — Inpatient Hospital Stay: Payer: BC Managed Care – PPO

## 2017-03-28 ENCOUNTER — Telehealth: Payer: Self-pay | Admitting: Genetic Counselor

## 2017-03-28 ENCOUNTER — Other Ambulatory Visit: Payer: Self-pay

## 2017-03-28 ENCOUNTER — Inpatient Hospital Stay (HOSPITAL_BASED_OUTPATIENT_CLINIC_OR_DEPARTMENT_OTHER): Payer: BC Managed Care – PPO | Admitting: Oncology

## 2017-03-28 VITALS — BP 138/89 | HR 92 | Temp 98.8°F | Resp 17 | Ht 64.0 in | Wt 229.0 lb

## 2017-03-28 DIAGNOSIS — C50311 Malignant neoplasm of lower-inner quadrant of right female breast: Secondary | ICD-10-CM | POA: Diagnosis not present

## 2017-03-28 DIAGNOSIS — Z170 Estrogen receptor positive status [ER+]: Principal | ICD-10-CM

## 2017-03-28 DIAGNOSIS — I10 Essential (primary) hypertension: Secondary | ICD-10-CM

## 2017-03-28 DIAGNOSIS — F320 Major depressive disorder, single episode, mild: Secondary | ICD-10-CM

## 2017-03-28 LAB — CMP (CANCER CENTER ONLY)
ALT: 43 U/L (ref 0–55)
AST: 24 U/L (ref 5–34)
Albumin: 3.7 g/dL (ref 3.5–5.0)
Alkaline Phosphatase: 70 U/L (ref 40–150)
Anion gap: 11 (ref 3–11)
BUN: 13 mg/dL (ref 7–26)
CO2: 25 mmol/L (ref 22–29)
CREATININE: 0.68 mg/dL (ref 0.60–1.10)
Calcium: 9.7 mg/dL (ref 8.4–10.4)
Chloride: 103 mmol/L (ref 98–109)
GFR, Est AFR Am: >60 mL/min (ref >60)
Glucose, Bld: 98 mg/dL (ref 70–140)
POTASSIUM: 4.2 mmol/L (ref 3.5–5.1)
Sodium: 139 mmol/L (ref 136–145)
Total Bilirubin: 0.2 mg/dL (ref 0.2–1.2)
Total Protein: 7.1 g/dL (ref 6.4–8.3)

## 2017-03-28 LAB — CBC WITH DIFFERENTIAL (CANCER CENTER ONLY)
BASOS ABS: 0.1 10*3/uL (ref 0.0–0.1)
Basophils Relative: 1 %
Eosinophils Absolute: 0 10*3/uL (ref 0.0–0.5)
Eosinophils Relative: 0 %
HEMATOCRIT: 34.9 % (ref 34.8–46.6)
Hemoglobin: 11.7 g/dL (ref 11.6–15.9)
LYMPHS ABS: 1.6 10*3/uL (ref 0.9–3.3)
LYMPHS PCT: 16 %
MCH: 29.5 pg (ref 25.1–34.0)
MCHC: 33.5 g/dL (ref 31.5–36.0)
MCV: 87.8 fL (ref 79.5–101.0)
MONO ABS: 0.5 10*3/uL (ref 0.1–0.9)
Monocytes Relative: 5 %
NEUTROS ABS: 7.9 10*3/uL — AB (ref 1.5–6.5)
Neutrophils Relative %: 78 %
Platelet Count: 132 10*3/uL — ABNORMAL LOW (ref 145–400)
RBC: 3.97 MIL/uL (ref 3.70–5.45)
RDW: 13.5 % (ref 11.2–14.5)
WBC Count: 10.2 10*3/uL (ref 3.9–10.3)

## 2017-03-28 MED ORDER — HEPARIN SOD (PORK) LOCK FLUSH 100 UNIT/ML IV SOLN
500.0000 [IU] | Freq: Once | INTRAVENOUS | Status: AC | PRN
  Administered 2017-03-28: 500 [IU]
  Filled 2017-03-28: qty 5

## 2017-03-28 MED ORDER — ONDANSETRON HCL 8 MG PO TABS: 8.0000 mg | ORAL_TABLET | Freq: Three times a day (TID) | ORAL | 0 refills | Status: DC | PRN

## 2017-03-28 MED ORDER — PALONOSETRON HCL INJECTION 0.25 MG/5ML
0.2500 mg | Freq: Once | INTRAVENOUS | Status: AC
  Administered 2017-03-28: 0.25 mg via INTRAVENOUS

## 2017-03-28 MED ORDER — SODIUM CHLORIDE 0.9 % IV SOLN
600.0000 mg/m2 | Freq: Once | INTRAVENOUS | Status: AC
  Administered 2017-03-28: 1280 mg via INTRAVENOUS
  Filled 2017-03-28: qty 64

## 2017-03-28 MED ORDER — SODIUM CHLORIDE 0.9 % IV SOLN
Freq: Once | INTRAVENOUS | Status: AC
  Administered 2017-03-28: 11:00:00 via INTRAVENOUS
  Filled 2017-03-28: qty 5

## 2017-03-28 MED ORDER — SODIUM CHLORIDE 0.9% FLUSH
10.0000 mL | INTRAVENOUS | Status: DC | PRN
  Administered 2017-03-28: 10 mL
  Filled 2017-03-28: qty 10

## 2017-03-28 MED ORDER — DOXORUBICIN HCL CHEMO IV INJECTION 2 MG/ML
60.0000 mg/m2 | Freq: Once | INTRAVENOUS | Status: AC
  Administered 2017-03-28: 128 mg via INTRAVENOUS
  Filled 2017-03-28: qty 64

## 2017-03-28 MED ORDER — SODIUM CHLORIDE 0.9 % IV SOLN
Freq: Once | INTRAVENOUS | Status: AC
  Administered 2017-03-28: 11:00:00 via INTRAVENOUS

## 2017-03-28 MED ORDER — PALONOSETRON HCL INJECTION 0.25 MG/5ML
INTRAVENOUS | Status: AC
  Filled 2017-03-28: qty 5

## 2017-03-28 NOTE — Telephone Encounter (Signed)
LM on VM with good news.  Asked that she CB. 

## 2017-03-28 NOTE — Addendum Note (Signed)
Addended by: Chauncey Cruel on: 03/28/2017 10:43 AM   Modules accepted: Orders

## 2017-03-28 NOTE — Patient Instructions (Addendum)
[facility] Discharge Instructions for Patients Receiving Chemotherapy  Today you received the following chemotherapy agents: Adriamycin and Cytoxan.  To help prevent nausea and vomiting after your treatment, we encourage you to take your nausea medication: Compazine. Take one every 6 hours as needed.  For nausea on or after 2/22, you may also take Zofran every 8 hours. If you develop nausea and vomiting that is not controlled by your nausea medication, call the clinic.   BELOW ARE SYMPTOMS THAT SHOULD BE REPORTED IMMEDIATELY:  *FEVER GREATER THAN 100.5 F  *CHILLS WITH OR WITHOUT FEVER  NAUSEA AND VOMITING THAT IS NOT CONTROLLED WITH YOUR NAUSEA MEDICATION  *UNUSUAL SHORTNESS OF BREATH  *UNUSUAL BRUISING OR BLEEDING  TENDERNESS IN MOUTH AND THROAT WITH OR WITHOUT PRESENCE OF ULCERS  *URINARY PROBLEMS  *BOWEL PROBLEMS  UNUSUAL RASH Items with * indicate a potential emergency and should be followed up as soon as possible.  Feel free to call the clinic should you have any questions or concerns. The clinic phone number is [phone].  Please show the [facility] at check-in to the Emergency Department and triage nurse.

## 2017-03-28 NOTE — Addendum Note (Signed)
Addended by: Chauncey Cruel on: 03/28/2017 09:41 AM   Modules accepted: Orders

## 2017-03-29 ENCOUNTER — Telehealth: Payer: Self-pay | Admitting: Oncology

## 2017-03-29 ENCOUNTER — Inpatient Hospital Stay: Payer: BC Managed Care – PPO

## 2017-03-29 VITALS — BP 124/61 | HR 77 | Temp 98.5°F | Resp 16

## 2017-03-29 DIAGNOSIS — C50311 Malignant neoplasm of lower-inner quadrant of right female breast: Secondary | ICD-10-CM

## 2017-03-29 DIAGNOSIS — Z170 Estrogen receptor positive status [ER+]: Principal | ICD-10-CM

## 2017-03-29 MED ORDER — PEGFILGRASTIM-CBQV 6 MG/0.6ML ~~LOC~~ SOSY
6.0000 mg | PREFILLED_SYRINGE | Freq: Once | SUBCUTANEOUS | Status: AC
  Administered 2017-03-29: 6 mg via SUBCUTANEOUS
  Filled 2017-03-29: qty 0.6

## 2017-03-29 NOTE — Telephone Encounter (Signed)
Per 2/19 no los °

## 2017-03-29 NOTE — Progress Notes (Signed)
Pt asked this nurse to note vital signs in chart for review by PCP 124/61 bp 77 p 98.5 t. Porsche Cates LPN

## 2017-03-30 ENCOUNTER — Ambulatory Visit: Payer: Self-pay | Admitting: Genetic Counselor

## 2017-03-30 DIAGNOSIS — Z170 Estrogen receptor positive status [ER+]: Secondary | ICD-10-CM

## 2017-03-30 DIAGNOSIS — Z1379 Encounter for other screening for genetic and chromosomal anomalies: Secondary | ICD-10-CM

## 2017-03-30 DIAGNOSIS — C50311 Malignant neoplasm of lower-inner quadrant of right female breast: Secondary | ICD-10-CM

## 2017-03-30 DIAGNOSIS — Z8042 Family history of malignant neoplasm of prostate: Secondary | ICD-10-CM

## 2017-03-30 NOTE — Progress Notes (Signed)
HPI: [name] was previously seen in the [facility] due to a personal and family history of cancer and concerns regarding a hereditary predisposition to cancer. Please refer to our prior cancer genetics clinic note for more information regarding [name] medical, social and family histories, and our assessment and recommendations, at the time. Shelby Payne recent genetic test results were disclosed to her, as were recommendations warranted by these results. These results and recommendations are discussed in more detail below.  CANCER HISTORY:    Malignant neoplasm of lower-inner quadrant of right breast of female, estrogen receptor positive ([place])   01/18/2017 Initial Diagnosis    Malignant neoplasm of lower-inner quadrant of right breast of female, estrogen receptor positive ([place])      03/22/2017 Genetic Testing    Negative genetic testing on the common hereditary cancer panel.  The Hereditary Gene Panel offered by [facility] includes sequencing and/or deletion duplication testing of the following 47 genes: APC, ATM, AXIN2, BARD1, BMPR1A, BRCA1, BRCA2, BRIP1, CDH1, CDK4, CDKN2A (p14ARF), CDKN2A (p16INK4a), CHEK2, CTNNA1, DICER1, EPCAM (Deletion/duplication testing only), GREM1 (promoter region deletion/duplication testing only), KIT, MEN1, MLH1, MSH2, MSH3, MSH6, MUTYH, NBN, NF1, NHTL1, PALB2, PDGFRA, PMS2, POLD1, POLE, PTEN, RAD50, RAD51C, RAD51D, SDHB, SDHC, SDHD, SMAD4, SMARCA4. STK11, TP53, TSC1, TSC2, and VHL.  The following genes were evaluated for sequence changes only: SDHA and HOXB13 c.251G>A variant only.  The report date is March 22, 2017.        FAMILY HISTORY:  We obtained a detailed, 4-generation family history.  Significant diagnoses are listed below: Family History  Problem Relation Age of Onset  . Hypertension Mother   . Hypothyroidism Mother   . Hyperlipidemia Mother   . Gout Mother   . Breast cancer Mother 59  . Heart disease  Father   . Hypertension Father   . Diabetes Father   . Hypothyroidism Father   . Prostate cancer Father        dx late 68s  . Hypothyroidism Sister   . Cancer Sister        angiosarcoma - mets  . Esophageal cancer Maternal Uncle   . Stroke Paternal Aunt   . Stroke Paternal Uncle   . Heart attack Maternal Grandfather   . ALS Paternal Grandfather   . COPD Neg Hx   . Colon cancer Neg Hx   . Rectal cancer Neg Hx   . Stomach cancer Neg Hx     The patient has a son and daughter.  Her son committed suicide at age 35, but her daughter is alive and well at age 63.  She ha two sisters.  One died of metastatic angiosarcoma at 24, diagnosed at 23.  The other is cancer free.  Both parents are deceased.  The patient's father had a stroked at 63, was diagnosed with prostate cancer in his 38's and died at 62.  He had a brother and sister who died of strokes.  Both parents died at older ages.  His mother was a [place] Ashkenazi Jew, who came here when she was young.  His father was also Ashkenazi.  The patient's mother had two sisters and two brothers.  She immigrated from [place] so not much information is known on this side of the family.  No cancer history is known.  [name] is unaware of previous family history of genetic testing for hereditary cancer risks. Patient's maternal ancestors are of [place] descent, and paternal ancestors are of [place] and [name] descent. There  is reported Ashkenazi Jewish ancestry. There is no known consanguinity.  GENETIC TEST RESULTS: Genetic testing reported out on March 22, 2017 through the common hereditary cancer panel found no deleterious mutations.  The Hereditary Gene Panel offered by [facility] includes sequencing and/or deletion duplication testing of the following 47 genes: APC, ATM, AXIN2, BARD1, BMPR1A, BRCA1, BRCA2, BRIP1, CDH1, CDK4, CDKN2A (p14ARF), CDKN2A (p16INK4a), CHEK2, CTNNA1, DICER1, EPCAM (Deletion/duplication testing only), GREM1  (promoter region deletion/duplication testing only), KIT, MEN1, MLH1, MSH2, MSH3, MSH6, MUTYH, NBN, NF1, NHTL1, PALB2, PDGFRA, PMS2, POLD1, POLE, PTEN, RAD50, RAD51C, RAD51D, SDHB, SDHC, SDHD, SMAD4, SMARCA4. STK11, TP53, TSC1, TSC2, and VHL.  The following genes were evaluated for sequence changes only: SDHA and HOXB13 c.251G>A variant only.  The test report has been scanned into EPIC and is located under the Molecular Pathology section of the Results Review tab.   We discussed with [name] that since the current genetic testing is not perfect, it is possible there may be a gene mutation in one of these genes that current testing cannot detect, but that chance is small. We also discussed, that it is possible that another gene that has not yet been discovered, or that we have not yet tested, is responsible for the cancer diagnoses in the family, and it is, therefore, important to remain in touch with cancer genetics in the future so that we can continue to offer [name] the most up to date genetic testing.       CANCER SCREENING RECOMMENDATIONS: This normal result is reassuring and indicates that [name] does not likely have an increased risk of cancer due to a mutation in one of these genes.  We, therefore, recommended  [name] continue to follow the cancer screening guidelines provided by her primary healthcare providers.   An individual's cancer risk and medical management are not determined by genetic test results alone. Overall cancer risk assessment incorporates additional factors, including personal medical history, family history, and any available genetic information that may result in a personalized plan for cancer prevention and surveillance.  RECOMMENDATIONS FOR FAMILY MEMBERS: Women in this family might be at some increased risk of developing cancer, over the general population risk, simply due to the family history of cancer. We recommended women in this  family have a yearly mammogram beginning at age 59, or 22 years younger than the earliest onset of cancer, an annual clinical breast exam, and perform monthly breast self-exams. Women in this family should also have a gynecological exam as recommended by their primary provider. All family members should have a colonoscopy by age 59.  FOLLOW-UP: Lastly, we discussed with [name] that cancer genetics is a rapidly advancing field and it is possible that new genetic tests will be appropriate for her and/or her family members in the future. We encouraged her to remain in contact with cancer genetics on an annual basis so we can update her personal and family histories and let her know of advances in cancer genetics that may benefit this family.   Our contact number was provided. [name] questions were answered to her satisfaction, and she knows she is welcome to call us at anytime with additional questions or concerns.   Roma Kayser, MS, [facility] Certified Genetic Counselor [email or web address]

## 2017-03-30 NOTE — Telephone Encounter (Signed)
Revealed negative genetic testing.  Discussed that we do not know why she has breast cancer or why there is cancer in the family. It could be due to a different gene that we are not testing, or maybe our current technology may not be able to pick something up.  It will be important for her to keep in contact with genetics to keep up with whether additional testing may be needed.  Her husbands side of the family has a reported BRCA mutation.  If they can obtain a copy of the family member's result, we would recommend testing her husband to determine if he has the hereditary mutation in the family.

## 2017-04-04 ENCOUNTER — Inpatient Hospital Stay (HOSPITAL_BASED_OUTPATIENT_CLINIC_OR_DEPARTMENT_OTHER): Payer: BC Managed Care – PPO | Admitting: Oncology

## 2017-04-04 ENCOUNTER — Telehealth: Payer: Self-pay | Admitting: Oncology

## 2017-04-04 ENCOUNTER — Inpatient Hospital Stay: Payer: BC Managed Care – PPO

## 2017-04-04 VITALS — BP 120/81 | HR 88 | Temp 98.4°F | Resp 18 | Ht 64.0 in | Wt 227.5 lb

## 2017-04-04 DIAGNOSIS — C50311 Malignant neoplasm of lower-inner quadrant of right female breast: Secondary | ICD-10-CM | POA: Diagnosis not present

## 2017-04-04 DIAGNOSIS — Z170 Estrogen receptor positive status [ER+]: Secondary | ICD-10-CM

## 2017-04-04 LAB — CBC WITH DIFFERENTIAL (CANCER CENTER ONLY)
BASOS PCT: 2 %
Basophils Absolute: 0 10*3/uL (ref 0.0–0.1)
Eosinophils Absolute: 0 10*3/uL (ref 0.0–0.5)
Eosinophils Relative: 2 %
HCT: 33.9 % — ABNORMAL LOW (ref 34.8–46.6)
HEMOGLOBIN: 11.5 g/dL — AB (ref 11.6–15.9)
LYMPHS ABS: 0.7 10*3/uL — AB (ref 0.9–3.3)
LYMPHS PCT: 44 %
MCH: 29.9 pg (ref 25.1–34.0)
MCHC: 33.8 g/dL (ref 31.5–36.0)
MCV: 88.6 fL (ref 79.5–101.0)
MONOS PCT: 13 %
Monocytes Absolute: 0.2 10*3/uL (ref 0.1–0.9)
NEUTROS PCT: 39 %
Neutro Abs: 0.6 10*3/uL — ABNORMAL LOW (ref 1.5–6.5)
Platelet Count: 301 10*3/uL (ref 145–400)
RBC: 3.83 MIL/uL (ref 3.70–5.45)
RDW: 14.3 % (ref 11.2–14.5)
WBC Count: 1.5 10*3/uL — ABNORMAL LOW (ref 3.9–10.3)

## 2017-04-04 LAB — CMP (CANCER CENTER ONLY)
ALK PHOS: 83 U/L (ref 40–150)
ALT: 43 U/L (ref 0–55)
AST: 14 U/L (ref 5–34)
Albumin: 3.6 g/dL (ref 3.5–5.0)
Anion gap: 10 (ref 3–11)
BILIRUBIN TOTAL: 0.4 mg/dL (ref 0.2–1.2)
BUN: 11 mg/dL (ref 7–26)
CALCIUM: 9.5 mg/dL (ref 8.4–10.4)
CO2: 26 mmol/L (ref 22–29)
Chloride: 99 mmol/L (ref 98–109)
Creatinine: 0.64 mg/dL (ref 0.60–1.10)
Glucose, Bld: 94 mg/dL (ref 70–140)
POTASSIUM: 4.8 mmol/L (ref 3.5–5.1)
Sodium: 135 mmol/L — ABNORMAL LOW (ref 136–145)
TOTAL PROTEIN: 6.8 g/dL (ref 6.4–8.3)

## 2017-04-04 NOTE — Progress Notes (Signed)
[facility]  [phone] [phone]     ID: Shelby Payne DOB: 30-Oct-1955  MR#: [id number]  SPQ#:[id number]  Patient Care Team: Hoyt Koch, MD as PCP - General (Internal Medicine) Magrinat, Virgie Dad, MD as Consulting Physician (Oncology) Eppie Gibson, MD as Attending Physician (Radiation Oncology) Erroll Luna, MD as Consulting Physician (General Surgery) Christene Slates, MD as Physician Assistant (Radiology) OTHER MD:  CHIEF COMPLAINT: Estrogen receptor positive breast cancer  CURRENT TREATMENT: Adjuvant chemotherapy    HISTORY OF CURRENT ILLNESS: From the original intake note:  The patient had bilateral screening mammography at [facility] 12/28/2016.  There was a new mass in the right breast lower inner quadrant and some new grouped calcifications in the right breast same quadrant.  Accordingly the patient was called back for right diagnostic mammography and ultrasonography 01/09/2017.  The breast density was category A.  In the right breast lower inner quadrant there was a new irregular mass.  Again noted was a new group of heterogeneous calcifications in the same quadrant.  Ultrasound confirmed a 1.7 cm irregular mass in the right breast lower inner quadrant.  There were no abnormalities sonographically in the right axilla.  Taken together,  the mass plus calcifications measured up to 4 cm.  Accordingly on 01/11/2017 she underwent biopsy of the right breast mass in question, showing [phone]) invasive ductal carcinoma, grade 2, estrogen receptor 90% positive with strong staining intensity, progesterone receptor negative, with an Mib-1 of 20% and no HER-2 amplification, the signals ratio being 1.26 and the number per cell 2.20  The patient's subsequent history is as detailed below.  INTERVAL HISTORY: Shelby Payne returns today for a follow-up and teatment of her estrogen receptor positive breast cancer. Today is  Day 8 cycle 2 of  Doxorubicin and Cyclophosphamide given on days 1 and 8 of every 14 day cycle, to be followed by weekly paclitaxel x 12. She notes that her scalp is tingly and she is protecting her hair from loss. She feels that the nausea has been worse compared to the last cycle, but she denies vomiting. She tried taking ondansetron and compazine but had little relief. She also experiences increased fatigued compared to the last cycle. She felt light headed this morning. She also has loose bowels, and took a substitute for Imodium.    REVIEW OF SYSTEMS: Shelby Payne reports that in addition from her chemotherapy side affects, her throat was sore, but its feels okay now. She denies unusual headaches, visual changes, vomiting, or dizziness. There has been no unusual cough, phlegm production, or pleurisy. This been no change in bladder habits. She denies unexplained weight loss, bleeding, rash, or fever. A detailed review of systems was otherwise stable.   PAST MEDICAL HISTORY: Past Medical History:  Diagnosis Date  . Acute sinusitis, unspecified   . Anxiety   . Cancer ([place]) 12/2016   right breast cancer  . Depression   . Family history of prostate cancer   . Hypertension   . Obesity, unspecified   . Pneumonia, organism unspecified(486)     PAST SURGICAL HISTORY: Past Surgical History:  Procedure Laterality Date  . BREAST LUMPECTOMY WITH RADIOACTIVE SEED AND SENTINEL LYMPH NODE BIOPSY Right 01/25/2017   Procedure: RIGHT BREAST LUMPECTOMY  WITH 2 RADIOACTIVE SEEDS AND SENTINEL LYMPH NODE BIOPSY;  Surgeon: Erroll Luna, MD;  Location: [facility];  Service: General;  Laterality: Right;  . PORTACATH PLACEMENT Right 03/13/2017   Procedure: ULTRASOUND GUIDED INSERTION PORT-A-CATH RIGHT INTERNAL JUGULAR;  Surgeon:  Erroll Luna, MD;  Location: [facility];  Service: General;  Laterality: Right;  . WISDOM TOOTH EXTRACTION      FAMILY HISTORY Family History  Problem Relation Age of Onset  . Hypertension  Mother   . Hypothyroidism Mother   . Hyperlipidemia Mother   . Gout Mother   . Breast cancer Mother 64  . Heart disease Father   . Hypertension Father   . Diabetes Father   . Hypothyroidism Father   . Prostate cancer Father        dx late 63s  . Hypothyroidism Sister   . Cancer Sister        angiosarcoma - mets  . Esophageal cancer Maternal Uncle   . Stroke Paternal Aunt   . Stroke Paternal Uncle   . Heart attack Maternal Grandfather   . ALS Paternal Grandfather   . COPD Neg Hx   . Colon cancer Neg Hx   . Rectal cancer Neg Hx   . Stomach cancer Neg Hx   Her father passed away from a massive stroke at 57. Her mother passed away from complications of breast cancer at 41. She had been diagnosed age 56. The patient's sister had angiosarcoma at 26. There is no otherhistory of breast or ovarian cancer in this famiy. The patient is of Ashkenazi extraction  GYNECOLOGIC HISTORY:  Patient's last menstrual period was 07/12/2011.  Menarche age 51, first live birth age 87, she is GXP2. Menopause 2015, never used OCPs or HR   SOCIAL HISTORY:  She is a Electrical engineer at [employer]. Her husband, [name] was a Curator at [employer] in [place] law, but is retired now. Her daughter [name] teaches nursing at [employer]. [name] is concerned that on her father's side a cousin has tested positive for a deleterious gene; she will try to bring [name] that information]. Son, [name] passed away. The patient does not have any grandchildren. She attends [employer].     ADVANCED DIRECTIVES:    HEALTH MAINTENANCE: Social History   Tobacco Use  . Smoking status: Never Smoker  . Smokeless tobacco: Never Used  Substance Use Topics  . Alcohol use: Yes    Comment: rare glass of wine  . Drug use: No     Colonoscopy: 2013  PAP:  Bone density:never   Allergies  Allergen Reactions  . Hctz [Hydrochlorothiazide] Other (See Comments)    Hyponatremia  . Bactrim  [Sulfamethoxazole-Trimethoprim] Rash    Current Outpatient Medications  Medication Sig Dispense Refill  . b complex vitamins tablet Take 1 tablet by mouth daily.    . Coenzyme Q10 (CO Q-10 PO) Take 1 capsule by mouth daily.     [name] Kitchen dexamethasone (DECADRON) 4 MG tablet Take 2 tablets by mouth once a day on the day after chemotherapy and then take 2 tablets two times a day for 2 days. Take with food. (Patient taking differently: Take 8 mg by mouth See admin instructions. Take 8 mg by mouth once a day on the day after chemotherapy and then take 8 mg two times a day for 2 days. Take with food.) 30 tablet 1  . HYDROcodone-acetaminophen (NORCO/VICODIN) 5-325 MG tablet Take 1 tablet by mouth every 6 (six) hours as needed for moderate pain. 12 tablet 0  . ibuprofen (ADVIL,MOTRIN) 800 MG tablet Take 1 tablet (800 mg total) by mouth every 8 (eight) hours as needed. (Patient not taking: Reported on 03/02/2017) 30 tablet 0  . lidocaine-prilocaine (EMLA) cream Apply to affected area  once (Patient not taking: Reported on 03/20/2017) 30 g 3  . LORazepam (ATIVAN) 0.5 MG tablet Take 1 tablet (0.5 mg total) by mouth at bedtime as needed (Nausea or vomiting). (Patient not taking: Reported on 03/20/2017) 30 tablet 0  . metoprolol succinate (TOPROL-XL) 100 MG 24 hr tablet Take 1 tablet (100 mg total) daily by mouth. 90 tablet 3  . Omega-3 Fatty Acids (FISH OIL PO) Take 1 capsule by mouth daily.     . ondansetron (ZOFRAN) 8 MG tablet Take 1 tablet (8 mg total) by mouth every 8 (eight) hours as needed for nausea or vomiting. Do not take within 3 days of chemotherapy dose. 20 tablet 0  . prochlorperazine (COMPAZINE) 10 MG tablet Take 1 tablet (10 mg total) by mouth every 6 (six) hours as needed (Nausea or vomiting). 30 tablet 1  . sertraline (ZOLOFT) 100 MG tablet Take 1 tablet (100 mg total) daily by mouth. 90 tablet 3  . Telmisartan-Amlodipine 40-5 MG TABS Take 1 tablet daily by mouth. 90 tablet 3  . triamcinolone cream  (KENALOG) 0.1 % Apply 1 application 2 (two) times daily topically. (Patient taking differently: Apply 1 application topically 2 (two) times daily as needed (for rash). ) 30 g 6   No current facility-administered medications for this visit.     OBJECTIVE: Middle-aged white woman who appears stated age  26:   04/04/17 1130  BP: 120/81  Pulse: 88  Resp: 18  Temp: 98.4 F (36.9 C)  SpO2: 98%     Body mass index is 39.05 kg/m.   Wt Readings from Last 3 Encounters:  04/04/17 227 lb 8 oz (103.2 kg)  03/28/17 229 lb (103.9 kg)  03/20/17 224 lb 1.6 oz (101.7 kg)      ECOG FS:1 - Symptomatic but completely ambulatory  Sclerae unicteric, EOMs intact Oropharynx clear and moist No cervical or supraclavicular adenopathy Lungs no rales or rhonchi Heart regular rate and rhythm Abd soft, nontender, positive bowel sounds MSK no focal spinal tenderness, no upper extremity lymphedema Neuro: nonfocal, well oriented, appropriate affect Breasts: Deferred      LAB RESULTS:  CMP     Component Value Date/Time   NA 139 03/28/2017 0945   NA 137 01/18/2017 1238   K 4.2 03/28/2017 0945   K 4.1 01/18/2017 1238   CL 103 03/28/2017 0945   CO2 25 03/28/2017 0945   CO2 22 01/18/2017 1238   GLUCOSE 98 03/28/2017 0945   GLUCOSE 91 01/18/2017 1238   BUN 13 03/28/2017 0945   BUN 19.5 01/18/2017 1238   CREATININE 0.68 03/28/2017 0945   CREATININE 0.7 01/18/2017 1238   CALCIUM 9.7 03/28/2017 0945   CALCIUM 9.8 01/18/2017 1238   PROT 7.1 03/28/2017 0945   PROT 7.9 01/18/2017 1238   ALBUMIN 3.7 03/28/2017 0945   ALBUMIN 4.3 01/18/2017 1238   AST 24 03/28/2017 0945   AST 19 01/18/2017 1238   ALT 43 03/28/2017 0945   ALT 24 01/18/2017 1238   ALKPHOS 70 03/28/2017 0945   ALKPHOS 53 01/18/2017 1238   BILITOT 0.2 03/28/2017 0945   BILITOT 0.39 01/18/2017 1238   GFRNONAA >60 03/28/2017 0945   GFRAA >60 03/28/2017 0945    No results found for: TOTALPROTELP, ALBUMINELP, A1GS, A2GS, BETS,  BETA2SER, GAMS, MSPIKE, SPEI  No results found for: KPAFRELGTCHN, LAMBDASER, KAPLAMBRATIO  Lab Results  Component Value Date   WBC 10.2 03/28/2017   NEUTROABS 7.9 (H) 03/28/2017   HGB 12.6 03/13/2017   HCT 34.9 03/28/2017  MCV 87.8 03/28/2017   PLT 132 (L) 03/28/2017    _0 @  No results found for: LABCA2  No components found for: LHTDSK876  No results for input(s): INR in the last 168 hours.  No results found for: LABCA2  No results found for: OTL572  No results found for: IOM355  No results found for: HRC163  No results found for: CA2729  No components found for: HGQUANT  No results found for: CEA1 / No results found for: CEA1   No results found for: AFPTUMOR  No results found for: CHROMOGRNA  No results found for: PSA1  No visits with results within 3 Day(s) from this visit.  Latest known visit with results is:  Appointment on 03/28/2017  Component Date Value Ref Range Status  . WBC Count 03/28/2017 10.2  3.9 - 10.3 K/uL Final  . RBC 03/28/2017 3.97  3.70 - 5.45 MIL/uL Final  . Hemoglobin 03/28/2017 11.7  11.6 - 15.9 g/dL Final  . HCT 03/28/2017 34.9  34.8 - 46.6 % Final  . MCV 03/28/2017 87.8  79.5 - 101.0 fL Final  . MCH 03/28/2017 29.5  25.1 - 34.0 pg Final  . MCHC 03/28/2017 33.5  31.5 - 36.0 g/dL Final  . RDW 03/28/2017 13.5  11.2 - 14.5 % Final  . Platelet Count 03/28/2017 132* 145 - 400 K/uL Final  . Neutrophils Relative % 03/28/2017 78  % Final  . Neutro Abs 03/28/2017 7.9* 1.5 - 6.5 K/uL Final  . Lymphocytes Relative 03/28/2017 16  % Final  . Lymphs Abs 03/28/2017 1.6  0.9 - 3.3 K/uL Final  . Monocytes Relative 03/28/2017 5  % Final  . Monocytes Absolute 03/28/2017 0.5  0.1 - 0.9 K/uL Final  . Eosinophils Relative 03/28/2017 0  % Final  . Eosinophils Absolute 03/28/2017 0.0  0.0 - 0.5 K/uL Final  . Basophils Relative 03/28/2017 1  % Final  . Basophils Absolute 03/28/2017 0.1  0.0 - 0.1 K/uL Final   Performed at [facility], [place] [address] [place], [place] [address]  . Sodium 03/28/2017 139  136 - 145 mmol/L Final  . Potassium 03/28/2017 4.2  3.5 - 5.1 mmol/L Final  . Chloride 03/28/2017 103  98 - 109 mmol/L Final  . CO2 03/28/2017 25  22 - 29 mmol/L Final  . Glucose, Bld 03/28/2017 98  70 - 140 mg/dL Final  . BUN 03/28/2017 13  7 - 26 mg/dL Final  . Creatinine 03/28/2017 0.68  0.60 - 1.10 mg/dL Final  . Calcium 03/28/2017 9.7  8.4 - 10.4 mg/dL Final  . Total Protein 03/28/2017 7.1  6.4 - 8.3 g/dL Final  . Albumin 03/28/2017 3.7  3.5 - 5.0 g/dL Final  . AST 03/28/2017 24  5 - 34 U/L Final  . ALT 03/28/2017 43  0 - 55 U/L Final  . Alkaline Phosphatase 03/28/2017 70  40 - 150 U/L Final  . Total Bilirubin 03/28/2017 0.2  0.2 - 1.2 mg/dL Final  . GFR, Est Non Af Am 03/28/2017 >60  >60 mL/min Final  . GFR, Est AFR Am 03/28/2017 >60  >60 mL/min Final   Comment: (NOTE) The eGFR has been calculated using the CKD EPI equation. This calculation has not been validated in all clinical situations. eGFR's persistently <60 mL/min signify possible Chronic Kidney Disease.   Georgiann Hahn gap 03/28/2017 11  3 - 11 Final   Performed at [facility], [place] [address], [place], [place] [address]    (  this displays the last labs from the last 3 days)  No results found for: TOTALPROTELP, ALBUMINELP, A1GS, A2GS, BETS, BETA2SER, GAMS, MSPIKE, SPEI (this displays SPEP labs)  No results found for: KPAFRELGTCHN, LAMBDASER, KAPLAMBRATIO (kappa/lambda light chains)  No results found for: HGBA, HGBA2QUANT, HGBFQUANT, HGBSQUAN (Hemoglobinopathy evaluation)   No results found for: LDH  No results found for: IRON, TIBC, IRONPCTSAT (Iron and TIBC)  No results found for: FERRITIN  Urinalysis    Component Value Date/Time   COLORURINE Yellow 12/29/2008 0809   APPEARANCEUR CLEAR 12/29/2008 0809   LABSPEC 1.020 12/29/2008 0809   PHURINE 6.0 12/29/2008 0809   GLUCOSEU NEGATIVE 12/29/2008  0809   BILIRUBINUR NEGATIVE 12/29/2008 0809   KETONESUR NEGATIVE 12/29/2008 0809   UROBILINOGEN 0.2 12/29/2008 0809   NITRITE NEGATIVE 12/29/2008 0809   LEUKOCYTESUR NEGATIVE 12/29/2008 0809     STUDIES: Dg Chest Port 1 View  Result Date: 03/13/2017 CLINICAL DATA:  Port-A-Cath placement EXAM: PORTABLE CHEST 1 VIEW COMPARISON:  None. FINDINGS: Right-sided Port-A-Cath from an internal jugular approach. This terminates at the mid SVC. Midline trachea. Normal heart size. No pleural effusion or pneumothorax. Clear lungs. IMPRESSION: Right-sided Port-A-Cath, as detailed above. This terminates at the mid SVC. No pneumothorax or other acute complication. Electronically Signed   By: Abigail Miyamoto M.D.   On: 03/13/2017 09:24   Dg Fluoro Guide Cv Line-no Report  Result Date: 03/13/2017 Fluoroscopy was utilized by the requesting physician.  No radiographic interpretation.    ELIGIBLE FOR AVAILABLE RESEARCH PROTOCOL: UPBEAT, ASA: Referral placed 02/28/2017  ASSESSMENT: 62 y.o. [place] woman s/p right breast lower inner quadrant biopsy 01/11/2017 for a cT1c cN0, stage IA invasive ductal breast cancer, estrogen receptor positive, progesterone receptor negativem HER-2 not amplified, with an Mib-1 of 20%  (1) genetics testing 03/22/2017 through the Hereditary Gene Panel offered by [facility] found no deleterious mutations in APC, ATM, AXIN2, BARD1, BMPR1A, BRCA1, BRCA2, BRIP1, CDH1, CDK4, CDKN2A (p14ARF), CDKN2A (p16INK4a), CHEK2, CTNNA1, DICER1, EPCAM (Deletion/duplication testing only), GREM1 (promoter region deletion/duplication testing only), KIT, MEN1, MLH1, MSH2, MSH3, MSH6, MUTYH, NBN, NF1, NHTL1, PALB2, PDGFRA, PMS2, POLD1, POLE, PTEN, RAD50, RAD51C, RAD51D, SDHB, SDHC, SDHD, SMAD4, SMARCA4. STK11, TP53, TSC1, TSC2, and VHL.  The following genes were evaluated for sequence changes only: SDHA and HOXB13 c.251G>A variant only.    (2) status post right lumpectomy and sentinel lymph node sampling 01/25/2017  for a pT1c pN0, stage Ia invasive ductal carcinoma, grade 2, with negative margins.  (3) Oncotype DX score of 42 predicts a 9-year risk of recurrence outside the breast of 30% if the patient's only systemic therapy is tamoxifen for 5 years.  It also predicts a significant chemotherapy benefit.  (4) adjuvant chemotherapy will consist of cyclophosphamide and doxorubicin in dose dense fashion x4 starting 03/14/2017 followed by weekly paclitaxel x12  (a) cycle 3 delayed 1 week because of poor tolerance  (5) adjuvant radiation to follow therapy  (6) antiestrogens to start at the completion of local treatment  PLAN: Shelby Payne had considerably more trouble with the second cycle and I think if we push on with every 2-week treatments we are going to probably land her in the hospital.  It makes more sense to give her an extra week to recover and then do cycle 3 on 04/18/2017.  Hopefully we will be able to get cycle 2:04 weeks but that also may require a delay.  I reassured her that the problems she is having are temporary and that she will fully recover.  It  is surprising that she has not already lost her hair, but it is very likely to be completely gone by the time we see her again 2 weeks from now  I am not changing her doses only the dosing interval  She knows to call for any other issues that may develop before the next visit. Magrinat, Virgie Dad, MD  04/04/17 12:00 PM Medical Oncology and Hematology [facility] [address] [place], [place] [address] Tel. [phone]    Fax. [phone]    This document serves as a record of services personally performed by Lurline Del, MD. It was created on his behalf by Sheron Nightingale, a trained medical scribe. The creation of this record is based on the scribe's personal observations and the provider's statements to them.   I have reviewed the above documentation for accuracy and completeness, and I agree with the above.

## 2017-04-04 NOTE — Telephone Encounter (Signed)
Patient declined avs and calendar. Patient will receive update in mychart.

## 2017-04-11 ENCOUNTER — Ambulatory Visit: Payer: BC Managed Care – PPO | Admitting: Oncology

## 2017-04-11 ENCOUNTER — Other Ambulatory Visit: Payer: BC Managed Care – PPO

## 2017-04-11 ENCOUNTER — Ambulatory Visit: Payer: BC Managed Care – PPO

## 2017-04-12 ENCOUNTER — Ambulatory Visit: Payer: BC Managed Care – PPO

## 2017-04-18 ENCOUNTER — Inpatient Hospital Stay: Payer: BC Managed Care – PPO

## 2017-04-18 ENCOUNTER — Inpatient Hospital Stay (HOSPITAL_BASED_OUTPATIENT_CLINIC_OR_DEPARTMENT_OTHER): Payer: BC Managed Care – PPO | Admitting: Adult Health

## 2017-04-18 ENCOUNTER — Inpatient Hospital Stay: Payer: BC Managed Care – PPO | Attending: Oncology

## 2017-04-18 VITALS — BP 120/76 | HR 96 | Temp 98.2°F | Resp 18 | Ht 64.0 in | Wt 229.3 lb

## 2017-04-18 DIAGNOSIS — Z170 Estrogen receptor positive status [ER+]: Principal | ICD-10-CM

## 2017-04-18 DIAGNOSIS — R110 Nausea: Secondary | ICD-10-CM | POA: Diagnosis not present

## 2017-04-18 DIAGNOSIS — Z5189 Encounter for other specified aftercare: Secondary | ICD-10-CM | POA: Insufficient documentation

## 2017-04-18 DIAGNOSIS — Z5111 Encounter for antineoplastic chemotherapy: Secondary | ICD-10-CM | POA: Insufficient documentation

## 2017-04-18 DIAGNOSIS — I10 Essential (primary) hypertension: Secondary | ICD-10-CM | POA: Diagnosis not present

## 2017-04-18 DIAGNOSIS — C50311 Malignant neoplasm of lower-inner quadrant of right female breast: Secondary | ICD-10-CM

## 2017-04-18 DIAGNOSIS — R197 Diarrhea, unspecified: Secondary | ICD-10-CM | POA: Diagnosis not present

## 2017-04-18 DIAGNOSIS — Z95828 Presence of other vascular implants and grafts: Secondary | ICD-10-CM

## 2017-04-18 LAB — CBC WITH DIFFERENTIAL (CANCER CENTER ONLY)
Basophils Absolute: 0 10*3/uL (ref 0.0–0.1)
Basophils Relative: 0 %
EOS PCT: 0 %
Eosinophils Absolute: 0 10*3/uL (ref 0.0–0.5)
HCT: 35.2 % (ref 34.8–46.6)
Hemoglobin: 11.7 g/dL (ref 11.6–15.9)
LYMPHS ABS: 1.5 10*3/uL (ref 0.9–3.3)
Lymphocytes Relative: 15 %
MCH: 29.9 pg (ref 25.1–34.0)
MCHC: 33.2 g/dL (ref 31.5–36.0)
MCV: 90 fL (ref 79.5–101.0)
MONO ABS: 1.1 10*3/uL — AB (ref 0.1–0.9)
MONOS PCT: 11 %
Neutro Abs: 6.9 10*3/uL — ABNORMAL HIGH (ref 1.5–6.5)
Neutrophils Relative %: 74 %
PLATELETS: 310 10*3/uL (ref 145–400)
RBC: 3.91 MIL/uL (ref 3.70–5.45)
RDW: 15.7 % — AB (ref 11.2–14.5)
WBC Count: 9.6 10*3/uL (ref 3.9–10.3)

## 2017-04-18 LAB — CMP (CANCER CENTER ONLY)
ALBUMIN: 3.9 g/dL (ref 3.5–5.0)
ALT: 35 U/L (ref 0–55)
AST: 22 U/L (ref 5–34)
Alkaline Phosphatase: 59 U/L (ref 40–150)
Anion gap: 10 (ref 3–11)
BILIRUBIN TOTAL: 0.3 mg/dL (ref 0.2–1.2)
BUN: 13 mg/dL (ref 7–26)
CO2: 26 mmol/L (ref 22–29)
Calcium: 10 mg/dL (ref 8.4–10.4)
Chloride: 101 mmol/L (ref 98–109)
Creatinine: 0.75 mg/dL (ref 0.60–1.10)
GFR, Est AFR Am: >60 mL/min (ref >60)
GFR, Estimated: >60 mL/min (ref >60)
GLUCOSE: 96 mg/dL (ref 70–140)
POTASSIUM: 4.3 mmol/L (ref 3.5–5.1)
Sodium: 137 mmol/L (ref 136–145)
TOTAL PROTEIN: 7.4 g/dL (ref 6.4–8.3)

## 2017-04-18 MED ORDER — PALONOSETRON HCL INJECTION 0.25 MG/5ML
INTRAVENOUS | Status: AC
  Filled 2017-04-18: qty 5

## 2017-04-18 MED ORDER — HEPARIN SOD (PORK) LOCK FLUSH 100 UNIT/ML IV SOLN
500.0000 [IU] | Freq: Once | INTRAVENOUS | Status: AC | PRN
  Administered 2017-04-18: 500 [IU]
  Filled 2017-04-18: qty 5

## 2017-04-18 MED ORDER — SODIUM CHLORIDE 0.9% FLUSH
10.0000 mL | INTRAVENOUS | Status: DC | PRN
  Administered 2017-04-18: 10 mL
  Filled 2017-04-18: qty 10

## 2017-04-18 MED ORDER — SODIUM CHLORIDE 0.9 % IV SOLN
Freq: Once | INTRAVENOUS | Status: AC
  Administered 2017-04-18: 15:00:00 via INTRAVENOUS

## 2017-04-18 MED ORDER — CYCLOPHOSPHAMIDE CHEMO INJECTION 1 GM
600.0000 mg/m2 | Freq: Once | INTRAMUSCULAR | Status: AC
  Administered 2017-04-18: 1280 mg via INTRAVENOUS
  Filled 2017-04-18: qty 64

## 2017-04-18 MED ORDER — PALONOSETRON HCL INJECTION 0.25 MG/5ML
0.2500 mg | Freq: Once | INTRAVENOUS | Status: AC
Start: 2017-04-18 — End: 2017-04-18
  Administered 2017-04-18: 0.25 mg via INTRAVENOUS

## 2017-04-18 MED ORDER — DOXORUBICIN HCL CHEMO IV INJECTION 2 MG/ML
60.0000 mg/m2 | Freq: Once | INTRAVENOUS | Status: AC
  Administered 2017-04-18: 128 mg via INTRAVENOUS
  Filled 2017-04-18: qty 64

## 2017-04-18 MED ORDER — SODIUM CHLORIDE 0.9 % IV SOLN
Freq: Once | INTRAVENOUS | Status: AC
  Administered 2017-04-18: 16:00:00 via INTRAVENOUS
  Filled 2017-04-18: qty 5

## 2017-04-18 NOTE — Patient Instructions (Signed)
[facility] Discharge Instructions for Patients Receiving Chemotherapy  Today you received the following chemotherapy agents: Adriamycin, Cytoxan  To help prevent nausea and vomiting after your treatment, we encourage you to take your nausea medication as directed.   If you develop nausea and vomiting that is not controlled by your nausea medication, call the clinic.   BELOW ARE SYMPTOMS THAT SHOULD BE REPORTED IMMEDIATELY:  *FEVER GREATER THAN 100.5 F  *CHILLS WITH OR WITHOUT FEVER  NAUSEA AND VOMITING THAT IS NOT CONTROLLED WITH YOUR NAUSEA MEDICATION  *UNUSUAL SHORTNESS OF BREATH  *UNUSUAL BRUISING OR BLEEDING  TENDERNESS IN MOUTH AND THROAT WITH OR WITHOUT PRESENCE OF ULCERS  *URINARY PROBLEMS  *BOWEL PROBLEMS  UNUSUAL RASH Items with * indicate a potential emergency and should be followed up as soon as possible.  Feel free to call the clinic should you have any questions or concerns. The clinic phone number is [phone].  Please show the CHEMO ALERT CARD at check-in to the Emergency Department and triage nurse.   

## 2017-04-18 NOTE — Progress Notes (Signed)
[place]  [phone] [phone]     ID: Shelby Payne DOB: 1955-08-25  MR#: [id number]  BEM#:[id number]  Patient Care Team: Hoyt Koch, MD as PCP - General (Internal Medicine) Magrinat, Virgie Dad, MD as Consulting Physician (Oncology) Eppie Gibson, MD as Attending Physician (Radiation Oncology) Erroll Luna, MD as Consulting Physician (General Surgery) Christene Slates, MD as Physician Assistant (Radiology) OTHER MD:  CHIEF COMPLAINT: Estrogen receptor positive breast cancer  CURRENT TREATMENT: Adjuvant chemotherapy    HISTORY OF CURRENT ILLNESS: From the original intake note:  The patient had bilateral screening mammography at [facility] 12/28/2016.  There was a new mass in the right breast lower inner quadrant and some new grouped calcifications in the right breast same quadrant.  Accordingly the patient was called back for right diagnostic mammography and ultrasonography 01/09/2017.  The breast density was category A.  In the right breast lower inner quadrant there was a new irregular mass.  Again noted was a new group of heterogeneous calcifications in the same quadrant.  Ultrasound confirmed a 1.7 cm irregular mass in the right breast lower inner quadrant.  There were no abnormalities sonographically in the right axilla.  Taken together,  the mass plus calcifications measured up to 4 cm.  Accordingly on 01/11/2017 she underwent biopsy of the right breast mass in question, showing [phone]) invasive ductal carcinoma, grade 2, estrogen receptor 90% positive with strong staining intensity, progesterone receptor negative, with an Mib-1 of 20% and no HER-2 amplification, the signals ratio being 1.26 and the number per cell 2.20  The patient's subsequent history is as detailed below.  INTERVAL HISTORY: Shelby Payne  Is here today for follow up prior to receiving her third cycle of doxorubicin and Cyclophosphamide.  She had a slightly  difficult time recovering from her last cycle, so Dr. Jana Hakim gave her an extra week off and she Is here for cycle 3 today.     REVIEW OF SYSTEMS: Shelby Payne is doing well today.  She is feeling more energized today than she was this time last week.  She denies any issues with fever, chills, nausea, vomiting, shortness of breath.  She denies any other concerns today and a detailed ROS was non contributory.   PAST MEDICAL HISTORY: Past Medical History:  Diagnosis Date  . Acute sinusitis, unspecified   . Anxiety   . Cancer ([place]) 12/2016   right breast cancer  . Depression   . Family history of prostate cancer   . Hypertension   . Obesity, unspecified   . Pneumonia, organism unspecified(486)     PAST SURGICAL HISTORY: Past Surgical History:  Procedure Laterality Date  . BREAST LUMPECTOMY WITH RADIOACTIVE SEED AND SENTINEL LYMPH NODE BIOPSY Right 01/25/2017   Procedure: RIGHT BREAST LUMPECTOMY  WITH 2 RADIOACTIVE SEEDS AND SENTINEL LYMPH NODE BIOPSY;  Surgeon: Erroll Luna, MD;  Location: [place];  Service: General;  Laterality: Right;  . PORTACATH PLACEMENT Right 03/13/2017   Procedure: ULTRASOUND GUIDED INSERTION PORT-A-CATH RIGHT INTERNAL JUGULAR;  Surgeon: Erroll Luna, MD;  Location: [place];  Service: General;  Laterality: Right;  . WISDOM TOOTH EXTRACTION      FAMILY HISTORY Family History  Problem Relation Age of Onset  . Hypertension Mother   . Hypothyroidism Mother   . Hyperlipidemia Mother   . Gout Mother   . Breast cancer Mother 27  . Heart disease Father   . Hypertension Father   . Diabetes Father   . Hypothyroidism Father   .  Prostate cancer Father        dx late 76s  . Hypothyroidism Sister   . Cancer Sister        angiosarcoma - mets  . Esophageal cancer Maternal Uncle   . Stroke Paternal Aunt   . Stroke Paternal Uncle   . Heart attack Maternal Grandfather   . ALS Paternal Grandfather   . COPD Neg Hx   . Colon cancer Neg Hx   . Rectal  cancer Neg Hx   . Stomach cancer Neg Hx   Her father passed away from a massive stroke at 47. Her mother passed away from complications of breast cancer at 86. She had been diagnosed age 11. The patient's sister had angiosarcoma at 5. There is no otherhistory of breast or ovarian cancer in this famiy. The patient is of Ashkenazi extraction  GYNECOLOGIC HISTORY:  Patient's last menstrual period was 07/12/2011.  Menarche age 42, first live birth age 71, she is GXP2. Menopause 2015, never used OCPs or HR   SOCIAL HISTORY:  She is a Electrical engineer at [employer]. Her husband, Shelby Payne was a Curator at [employer] in [place]' law, but is retired now. Her daughter Shelby Payne teaches nursing at [employer]. Shelby Payne is concerned that on her father's side a cousin has tested positive for a deleterious gene; she will try to bring [name] that information]. Son, Shelby Payne passed away. The patient does not have any grandchildren. She attends [employer].     ADVANCED DIRECTIVES:    HEALTH MAINTENANCE: Social History   Tobacco Use  . Smoking status: Never Smoker  . Smokeless tobacco: Never Used  Substance Use Topics  . Alcohol use: Yes    Comment: rare glass of wine  . Drug use: No     Colonoscopy: 2013  PAP:  Bone density:never   Allergies  Allergen Reactions  . Hctz [Hydrochlorothiazide] Other (See Comments)    Hyponatremia  . Bactrim [Sulfamethoxazole-Trimethoprim] Rash    Current Outpatient Medications  Medication Sig Dispense Refill  . b complex vitamins tablet Take 1 tablet by mouth daily.    . Coenzyme Q10 (CO Q-10 PO) Take 1 capsule by mouth daily.     [name] Kitchen dexamethasone (DECADRON) 4 MG tablet Take 2 tablets by mouth once a day on the day after chemotherapy and then take 2 tablets two times a day for 2 days. Take with food. (Patient taking differently: Take 8 mg by mouth See admin instructions. Take 8 mg by mouth once a day on the day after chemotherapy and then take  8 mg two times a day for 2 days. Take with food.) 30 tablet 1  . lidocaine-prilocaine (EMLA) cream Apply to affected area once 30 g 3  . metoprolol succinate (TOPROL-XL) 100 MG 24 hr tablet Take 1 tablet (100 mg total) daily by mouth. 90 tablet 3  . Omega-3 Fatty Acids (FISH OIL PO) Take 1 capsule by mouth daily.     . ondansetron (ZOFRAN) 8 MG tablet Take 1 tablet (8 mg total) by mouth every 8 (eight) hours as needed for nausea or vomiting. Do not take within 3 days of chemotherapy dose. 20 tablet 0  . prochlorperazine (COMPAZINE) 10 MG tablet Take 1 tablet (10 mg total) by mouth every 6 (six) hours as needed (Nausea or vomiting). 30 tablet 1  . sertraline (ZOLOFT) 100 MG tablet Take 1 tablet (100 mg total) daily by mouth. 90 tablet 3  . Telmisartan-Amlodipine 40-5 MG TABS Take 1 tablet daily  by mouth. 90 tablet 3  . triamcinolone cream (KENALOG) 0.1 % Apply 1 application 2 (two) times daily topically. (Patient taking differently: Apply 1 application topically 2 (two) times daily as needed (for rash). ) 30 g 6  . LORazepam (ATIVAN) 0.5 MG tablet Take 1 tablet (0.5 mg total) by mouth at bedtime as needed (Nausea or vomiting). (Patient not taking: Reported on 04/18/2017) 30 tablet 0   No current facility-administered medications for this visit.     OBJECTIVE:  Vitals:   04/18/17 1335  BP: 120/76  Pulse: 96  Resp: 18  Temp: 98.2 F (36.8 C)  SpO2: 96%     Body mass index is 39.36 kg/m.   Wt Readings from Last 3 Encounters:  04/18/17 229 lb 4.8 oz (104 kg)  04/04/17 227 lb 8 oz (103.2 kg)  03/28/17 229 lb (103.9 kg)  ECOG FS:1 - Symptomatic but completely ambulatory GENERAL: Patient is a well appearing female in no acute distress HEENT:  Sclerae anicteric.  Oropharynx clear and moist. No ulcerations or evidence of oropharyngeal candidiasis. Neck is supple.  NODES:  No cervical, supraclavicular, or axillary lymphadenopathy palpated.  BREAST EXAM:  Deferred. LUNGS:  Clear to auscultation  bilaterally.  No wheezes or rhonchi. HEART:  Regular rate and rhythm. No murmur appreciated. ABDOMEN:  Soft, nontender.  Positive, normoactive bowel sounds. No organomegaly palpated. MSK:  No focal spinal tenderness to palpation. Full range of motion bilaterally in the upper extremities. EXTREMITIES:  No peripheral edema.   SKIN:  Clear with no obvious rashes or skin changes. No nail dyscrasia. NEURO:  Nonfocal. Well oriented.  Appropriate affect.        LAB RESULTS:  CMP     Component Value Date/Time   NA 137 04/18/2017 1231   NA 137 01/18/2017 1238   K 4.3 04/18/2017 1231   K 4.1 01/18/2017 1238   CL 101 04/18/2017 1231   CO2 26 04/18/2017 1231   CO2 22 01/18/2017 1238   GLUCOSE 96 04/18/2017 1231   GLUCOSE 91 01/18/2017 1238   BUN 13 04/18/2017 1231   BUN 19.5 01/18/2017 1238   CREATININE 0.75 04/18/2017 1231   CREATININE 0.7 01/18/2017 1238   CALCIUM 10.0 04/18/2017 1231   CALCIUM 9.8 01/18/2017 1238   PROT 7.4 04/18/2017 1231   PROT 7.9 01/18/2017 1238   ALBUMIN 3.9 04/18/2017 1231   ALBUMIN 4.3 01/18/2017 1238   AST 22 04/18/2017 1231   AST 19 01/18/2017 1238   ALT 35 04/18/2017 1231   ALT 24 01/18/2017 1238   ALKPHOS 59 04/18/2017 1231   ALKPHOS 53 01/18/2017 1238   BILITOT 0.3 04/18/2017 1231   BILITOT 0.39 01/18/2017 1238   GFRNONAA >60 04/18/2017 1231   GFRAA >60 04/18/2017 1231    No results found for: TOTALPROTELP, ALBUMINELP, A1GS, A2GS, BETS, BETA2SER, GAMS, MSPIKE, SPEI  No results found for: KPAFRELGTCHN, LAMBDASER, KAPLAMBRATIO  Lab Results  Component Value Date   WBC 9.6 04/18/2017   NEUTROABS 6.9 (H) 04/18/2017   HGB 12.6 03/13/2017   HCT 35.2 04/18/2017   MCV 90.0 04/18/2017   PLT 310 04/18/2017    '@LASTCHEMISTRY'$ @  No results found for: LABCA2  No components found for: [id number]  No results for input(s): INR in the last 168 hours.  No results found for: LABCA2  No results found for: BJS283  No results found for:  TDV761  No results found for: YWV371  No results found for: CA2729  No components found for: HGQUANT  No results  found for: CEA1 / No results found for: CEA1   No results found for: AFPTUMOR  No results found for: [place]  No results found for: PSA1  Appointment on 04/18/2017  Component Date Value Ref Range Status  . Sodium 04/18/2017 137  136 - 145 mmol/L Final  . Potassium 04/18/2017 4.3  3.5 - 5.1 mmol/L Final  . Chloride 04/18/2017 101  98 - 109 mmol/L Final  . CO2 04/18/2017 26  22 - 29 mmol/L Final  . Glucose, Bld 04/18/2017 96  70 - 140 mg/dL Final  . BUN 04/18/2017 13  7 - 26 mg/dL Final  . Creatinine 04/18/2017 0.75  0.60 - 1.10 mg/dL Final  . Calcium 04/18/2017 10.0  8.4 - 10.4 mg/dL Final  . Total Protein 04/18/2017 7.4  6.4 - 8.3 g/dL Final  . Albumin 04/18/2017 3.9  3.5 - 5.0 g/dL Final  . AST 04/18/2017 22  5 - 34 U/L Final  . ALT 04/18/2017 35  0 - 55 U/L Final  . Alkaline Phosphatase 04/18/2017 59  40 - 150 U/L Final  . Total Bilirubin 04/18/2017 0.3  0.2 - 1.2 mg/dL Final  . GFR, Est Non Af Am 04/18/2017 >60  >60 mL/min Final  . GFR, Est AFR Am 04/18/2017 >60  >60 mL/min Final   Comment: (NOTE) The eGFR has been calculated using the CKD EPI equation. This calculation has not been validated in all clinical situations. eGFR's persistently <60 mL/min signify possible Chronic Kidney Disease.   Shelby Payne gap 04/18/2017 10  3 - 11 Final   Performed at [facility], [place] [address] [place], [place] [address]  . WBC Count 04/18/2017 9.6  3.9 - 10.3 K/uL Final  . RBC 04/18/2017 3.91  3.70 - 5.45 MIL/uL Final  . Hemoglobin 04/18/2017 11.7  11.6 - 15.9 g/dL Final  . HCT 04/18/2017 35.2  34.8 - 46.6 % Final  . MCV 04/18/2017 90.0  79.5 - 101.0 fL Final  . MCH 04/18/2017 29.9  25.1 - 34.0 pg Final  . MCHC 04/18/2017 33.2  31.5 - 36.0 g/dL Final  . RDW 04/18/2017 15.7* 11.2 - 14.5 % Final  . Platelet Count 04/18/2017 310  145 - 400 K/uL  Final  . Neutrophils Relative % 04/18/2017 74  % Final  . Neutro Abs 04/18/2017 6.9* 1.5 - 6.5 K/uL Final  . Lymphocytes Relative 04/18/2017 15  % Final  . Lymphs Abs 04/18/2017 1.5  0.9 - 3.3 K/uL Final  . Monocytes Relative 04/18/2017 11  % Final  . Monocytes Absolute 04/18/2017 1.1* 0.1 - 0.9 K/uL Final  . Eosinophils Relative 04/18/2017 0  % Final  . Eosinophils Absolute 04/18/2017 0.0  0.0 - 0.5 K/uL Final  . Basophils Relative 04/18/2017 0  % Final  . Basophils Absolute 04/18/2017 0.0  0.0 - 0.1 K/uL Final   Performed at [facility], [place] [address], [place], [place] [address]    (this displays the last labs from the last 3 days)  No results found for: TOTALPROTELP, ALBUMINELP, A1GS, A2GS, BETS, BETA2SER, GAMS, MSPIKE, SPEI (this displays SPEP labs)  No results found for: KPAFRELGTCHN, LAMBDASER, KAPLAMBRATIO (kappa/lambda light chains)  No results found for: HGBA, HGBA2QUANT, HGBFQUANT, HGBSQUAN (Hemoglobinopathy evaluation)   No results found for: LDH  No results found for: IRON, TIBC, IRONPCTSAT (Iron and TIBC)  No results found for: FERRITIN  Urinalysis    Component Value Date/Time   COLORURINE Yellow 12/29/2008 0809   APPEARANCEUR CLEAR 12/29/2008  0809   LABSPEC 1.020 12/29/2008 0809   PHURINE 6.0 12/29/2008 0809   GLUCOSEU NEGATIVE 12/29/2008 0809   BILIRUBINUR NEGATIVE 12/29/2008 0809   KETONESUR NEGATIVE 12/29/2008 0809   UROBILINOGEN 0.2 12/29/2008 0809   NITRITE NEGATIVE 12/29/2008 0809   LEUKOCYTESUR NEGATIVE 12/29/2008 0809     STUDIES: No results found.  ELIGIBLE FOR AVAILABLE RESEARCH PROTOCOL: UPBEAT, ASA: Referral placed 02/28/2017  ASSESSMENT: 62 y.o. [place] woman s/p right breast lower inner quadrant biopsy 01/11/2017 for a cT1c cN0, stage IA invasive ductal breast cancer, estrogen receptor positive, progesterone receptor negativem HER-2 not amplified, with an Mib-1 of 20%  (1) genetics testing 03/22/2017  through the Hereditary Gene Panel offered by [facility] found no deleterious mutations in APC, ATM, AXIN2, BARD1, BMPR1A, BRCA1, BRCA2, BRIP1, CDH1, CDK4, CDKN2A (p14ARF), CDKN2A (p16INK4a), CHEK2, CTNNA1, DICER1, EPCAM (Deletion/duplication testing only), GREM1 (promoter region deletion/duplication testing only), KIT, MEN1, MLH1, MSH2, MSH3, MSH6, MUTYH, NBN, NF1, NHTL1, PALB2, PDGFRA, PMS2, POLD1, POLE, PTEN, RAD50, RAD51C, RAD51D, SDHB, SDHC, SDHD, SMAD4, SMARCA4. STK11, TP53, TSC1, TSC2, and VHL.  The following genes were evaluated for sequence changes only: SDHA and HOXB13 c.251G>A variant only.    (2) status post right lumpectomy and sentinel lymph node sampling 01/25/2017 for a pT1c pN0, stage Ia invasive ductal carcinoma, grade 2, with negative margins.  (3) Oncotype DX score of 42 predicts a 9-year risk of recurrence outside the breast of 30% if the patient's only systemic therapy is tamoxifen for 5 years.  It also predicts a significant chemotherapy benefit.  (4) adjuvant chemotherapy will consist of cyclophosphamide and doxorubicin in dose dense fashion x4 starting 03/14/2017 followed by weekly paclitaxel x12  (a) cycle 3 delayed 1 week because of poor tolerance  (5) adjuvant radiation to follow therapy  (6) antiestrogens to start at the completion of local treatment  PLAN:  Laurelai is doing well today.  She will proceed with cycle 3 of treatment (as long as her CMET is within parameters).  I reviewed her CBC with her in detail.  I attempted to go ahead and send the scheduling request for cycle 4.  She prefers to wait and discuss with Dr. Jana Hakim at her next appointment in one week on whether she should do cycle 4 in 2 weeks versus 3 from cycle 3.  This is reasonable.    She knows to call for any other issues that may develop before the next visit.  A total of (20) minutes of face-to-face time was spent with this patient with greater than 50% of that time in counseling and  care-coordination.    Wilber Bihari, NP  04/19/17 11:46 AM Medical Oncology and Hematology [facility] [address] [place], [place] [address] Tel. [phone]    Fax. [phone]

## 2017-04-19 ENCOUNTER — Ambulatory Visit: Payer: BC Managed Care – PPO | Admitting: Oncology

## 2017-04-19 ENCOUNTER — Inpatient Hospital Stay: Payer: BC Managed Care – PPO

## 2017-04-19 ENCOUNTER — Encounter: Payer: Self-pay | Admitting: Adult Health

## 2017-04-19 DIAGNOSIS — C50311 Malignant neoplasm of lower-inner quadrant of right female breast: Secondary | ICD-10-CM

## 2017-04-19 DIAGNOSIS — Z170 Estrogen receptor positive status [ER+]: Principal | ICD-10-CM

## 2017-04-19 MED ORDER — PEGFILGRASTIM-CBQV 6 MG/0.6ML ~~LOC~~ SOSY
6.0000 mg | PREFILLED_SYRINGE | Freq: Once | SUBCUTANEOUS | Status: AC
  Administered 2017-04-19: 6 mg via SUBCUTANEOUS

## 2017-04-19 MED ORDER — PEGFILGRASTIM-CBQV 6 MG/0.6ML ~~LOC~~ SOSY
PREFILLED_SYRINGE | SUBCUTANEOUS | Status: AC
  Filled 2017-04-19: qty 0.6

## 2017-04-19 NOTE — Progress Notes (Signed)
OK to give Udenyca early per Dr. Jana Hakim  T.O. Dr. Magrinat/CJones PharmD  Henreitta Leber, PharmD

## 2017-04-20 NOTE — Progress Notes (Signed)
Shelby Payne  [phone]) [phone] Fax:([phone]) [phone]     ID: MAALIYAH ADOLPH DOB: [birth year]  MR#: [id number]  BMW#:[id number]  Patient Care Team: Hoyt Koch, MD as PCP - General (Internal Medicine) Breanna Shorkey, Virgie Dad, MD as Consulting Physician (Oncology) Eppie Gibson, MD as Attending Physician (Radiation Oncology) Erroll Luna, MD as Consulting Physician (General Surgery) Christene Slates, MD as Physician Assistant (Radiology) OTHER MD:  CHIEF COMPLAINT: Estrogen receptor positive breast cancer  CURRENT TREATMENT: Adjuvant chemotherapy    HISTORY OF CURRENT ILLNESS: From the original intake note:  The patient had bilateral screening mammography at [facility] 12/28/2016.  There was a new mass in the right breast lower inner quadrant and some new grouped calcifications in the right breast same quadrant.  Accordingly the patient was called back for right diagnostic mammography and ultrasonography 01/09/2017.  The breast density was category A.  In the right breast lower inner quadrant there was a new irregular mass.  Again noted was a new group of heterogeneous calcifications in the same quadrant.  Ultrasound confirmed a 1.7 cm irregular mass in the right breast lower inner quadrant.  There were no abnormalities sonographically in the right axilla.  Taken together,  the mass plus calcifications measured up to 4 cm.  Accordingly on 01/11/2017 she underwent biopsy of the right breast mass in question, showing [phone]) invasive ductal carcinoma, grade 2, estrogen receptor 90% positive with strong staining intensity, progesterone receptor negative, with an Mib-1 of 20% and no HER-2 amplification, the signals ratio being 1.26 and the number per cell 2.20  The patient's subsequent history is as detailed below.  INTERVAL HISTORY: Shelby Payne returns today for follow up and treatment of her estrogen receptor positive breast cancer. She received cycle 3 of  cyclophosphamide and doxorubicin on 04/18/2017, which makes today day 8 of cycle 3. She tolerates this generally well. She feels fine on days 1, 2, and 3 after treatment. She begins to feels nauseous on day 4. During this cycle on day 4, she had nausea and diarrhea. She took compazine in the morning and had diarrhea after lunch. She took Imodium to relieve this. She denies being constipated before that episode. By day 6, she was very fatigued and had a decrease in energy. She had frequency in urination and kept water next to her bed. Her sense of taste is not bad. She denies oral sores.    REVIEW OF SYSTEMS: [name] reports that she does take walks or do much activity during the day because she is very fatigued. She has trying to keep up with her work. She denies unusual headaches, visual changes, nausea, vomiting, or dizziness. There has been no unusual cough, phlegm production, or pleurisy. This been no change in bowel or bladder habits. She denies unexplained fatigue or unexplained weight loss, bleeding, rash, or fever. A detailed review of systems was otherwise stable.    PAST MEDICAL HISTORY: Past Medical History:  Diagnosis Date  . Acute sinusitis, unspecified   . Anxiety   . Cancer ([place]) 12/2016   right breast cancer  . Depression   . Family history of prostate cancer   . Hypertension   . Obesity, unspecified   . Pneumonia, organism unspecified(486)     PAST SURGICAL HISTORY: Past Surgical History:  Procedure Laterality Date  . BREAST LUMPECTOMY WITH RADIOACTIVE SEED AND SENTINEL LYMPH NODE BIOPSY Right 01/25/2017   Procedure: RIGHT BREAST LUMPECTOMY  WITH 2 RADIOACTIVE SEEDS AND SENTINEL LYMPH NODE BIOPSY;  Surgeon: Erroll Luna, MD;  Location: [facility];  Service: General;  Laterality: Right;  . PORTACATH PLACEMENT Right 03/13/2017   Procedure: ULTRASOUND GUIDED INSERTION PORT-A-CATH RIGHT INTERNAL JUGULAR;  Surgeon: Erroll Luna, MD;  Location: [place];  Service:  General;  Laterality: Right;  . WISDOM TOOTH EXTRACTION      FAMILY HISTORY Family History  Problem Relation Age of Onset  . Hypertension Mother   . Hypothyroidism Mother   . Hyperlipidemia Mother   . Gout Mother   . Breast cancer Mother 8  . Heart disease Father   . Hypertension Father   . Diabetes Father   . Hypothyroidism Father   . Prostate cancer Father        dx late 79s  . Hypothyroidism Sister   . Cancer Sister        angiosarcoma - mets  . Esophageal cancer Maternal Uncle   . Stroke Paternal Aunt   . Stroke Paternal Uncle   . Heart attack Maternal Grandfather   . ALS Paternal Grandfather   . COPD Neg Hx   . Colon cancer Neg Hx   . Rectal cancer Neg Hx   . Stomach cancer Neg Hx   Her father passed away from a massive stroke at 72. Her mother passed away from complications of breast cancer at 38. She had been diagnosed age 51. The patient's sister had angiosarcoma at 36. There is no otherhistory of breast or ovarian cancer in this famiy. The patient is of Ashkenazi extraction  GYNECOLOGIC HISTORY:  Patient's last menstrual period was 07/12/2011.  Menarche age 42, first live birth age 75, she is GXP2. Menopause 2015, never used OCPs or HR   SOCIAL HISTORY:  She is a Electrical engineer at [employer]. Her husband, [name] was a Curator at [employer] in [place] law, but is retired now. Her daughter [name] teaches nursing at [employer]. [name] is concerned that on her father's side a cousin has tested positive for a deleterious gene; she will try to bring Shelby Payne that information]. Son, [name] passed away. The patient does not have any grandchildren. She attends [employer].     ADVANCED DIRECTIVES:    HEALTH MAINTENANCE: Social History   Tobacco Use  . Smoking status: Never Smoker  . Smokeless tobacco: Never Used  Substance Use Topics  . Alcohol use: Yes    Comment: rare glass of wine  . Drug use: No     Colonoscopy: 2013  PAP:  Bone  density:never   Allergies  Allergen Reactions  . Hctz [Hydrochlorothiazide] Other (See Comments)    Hyponatremia  . Bactrim [Sulfamethoxazole-Trimethoprim] Rash    Current Outpatient Medications  Medication Sig Dispense Refill  . b complex vitamins tablet Take 1 tablet by mouth daily.    . Coenzyme Q10 (CO Q-10 PO) Take 1 capsule by mouth daily.     [name] Kitchen dexamethasone (DECADRON) 4 MG tablet Take 2 tablets by mouth once a day on the day after chemotherapy and then take 2 tablets two times a day for 2 days. Take with food. (Patient taking differently: Take 8 mg by mouth See admin instructions. Take 8 mg by mouth once a day on the day after chemotherapy and then take 8 mg two times a day for 2 days. Take with food.) 30 tablet 1  . lidocaine-prilocaine (EMLA) cream Apply to affected area once 30 g 3  . metoprolol succinate (TOPROL-XL) 100 MG 24 hr tablet Take 1 tablet (100 mg total) daily by mouth. 90 tablet  3  . Omega-3 Fatty Acids (FISH OIL PO) Take 1 capsule by mouth daily.     . ondansetron (ZOFRAN) 8 MG tablet Take 1 tablet (8 mg total) by mouth every 8 (eight) hours as needed for nausea or vomiting. Do not take within 3 days of chemotherapy dose. 20 tablet 0  . prochlorperazine (COMPAZINE) 10 MG tablet Take 1 tablet (10 mg total) by mouth every 6 (six) hours as needed (Nausea or vomiting). 30 tablet 1  . sertraline (ZOLOFT) 100 MG tablet Take 1 tablet (100 mg total) daily by mouth. 90 tablet 3  . Telmisartan-Amlodipine 40-5 MG TABS Take 1 tablet daily by mouth. 90 tablet 3  . triamcinolone cream (KENALOG) 0.1 % Apply 1 application 2 (two) times daily topically. (Patient taking differently: Apply 1 application topically 2 (two) times daily as needed (for rash). ) 30 g 6  . LORazepam (ATIVAN) 0.5 MG tablet Take 1 tablet (0.5 mg total) by mouth at bedtime as needed (Nausea or vomiting). (Patient not taking: Reported on 04/18/2017) 30 tablet 0   No current facility-administered medications for this  visit.     OBJECTIVE: L aged white woman who appears stated age  62:   04/25/17 1100  BP: 123/73  Pulse: 95  Resp: 18  Temp: 98.5 F (36.9 C)  SpO2: 96%     Body mass index is 39.79 kg/m.   Wt Readings from Last 3 Encounters:  04/25/17 231 lb 12.8 oz (105.1 kg)  04/18/17 229 lb 4.8 oz (104 kg)  04/04/17 227 lb 8 oz (103.2 kg)  ECOG FS:1 - Symptomatic but completely ambulatory  Sclerae unicteric, EOMs intact Oropharynx clear and moist No cervical or supraclavicular adenopathy Lungs no rales or rhonchi Heart regular rate and rhythm Abd soft, nontender, positive bowel sounds MSK no focal spinal tenderness, no upper extremity lymphedema Neuro: nonfocal, well oriented, appropriate affect Breasts: Deferred  LAB RESULTS:  CMP     Component Value Date/Time   NA 137 04/18/2017 1231   NA 137 01/18/2017 1238   K 4.3 04/18/2017 1231   K 4.1 01/18/2017 1238   CL 101 04/18/2017 1231   CO2 26 04/18/2017 1231   CO2 22 01/18/2017 1238   GLUCOSE 96 04/18/2017 1231   GLUCOSE 91 01/18/2017 1238   BUN 13 04/18/2017 1231   BUN 19.5 01/18/2017 1238   CREATININE 0.75 04/18/2017 1231   CREATININE 0.7 01/18/2017 1238   CALCIUM 10.0 04/18/2017 1231   CALCIUM 9.8 01/18/2017 1238   PROT 7.4 04/18/2017 1231   PROT 7.9 01/18/2017 1238   ALBUMIN 3.9 04/18/2017 1231   ALBUMIN 4.3 01/18/2017 1238   AST 22 04/18/2017 1231   AST 19 01/18/2017 1238   ALT 35 04/18/2017 1231   ALT 24 01/18/2017 1238   ALKPHOS 59 04/18/2017 1231   ALKPHOS 53 01/18/2017 1238   BILITOT 0.3 04/18/2017 1231   BILITOT 0.39 01/18/2017 1238   GFRNONAA >60 04/18/2017 1231   GFRAA >60 04/18/2017 1231    No results found for: TOTALPROTELP, ALBUMINELP, A1GS, A2GS, BETS, BETA2SER, GAMS, MSPIKE, SPEI  No results found for: KPAFRELGTCHN, LAMBDASER, KAPLAMBRATIO  Lab Results  Component Value Date   WBC 1.8 (L) 04/25/2017   NEUTROABS 1.0 (L) 04/25/2017   HGB 12.6 03/13/2017   HCT 30.6 (L) 04/25/2017   MCV  89.3 04/25/2017   PLT 195 04/25/2017    _0 @  No results found for: LABCA2  No components found for: DGLOVF643  No results for input(s): INR in the  last 168 hours.  No results found for: LABCA2  No results found for: DVV616  No results found for: WVP710  No results found for: GYI948  No results found for: CA2729  No components found for: HGQUANT  No results found for: CEA1 / No results found for: CEA1   No results found for: AFPTUMOR  No results found for: CHROMOGRNA  No results found for: PSA1  Appointment on 04/25/2017  Component Date Value Ref Range Status  . WBC Count 04/25/2017 1.8* 3.9 - 10.3 K/uL Final  . RBC 04/25/2017 3.43* 3.70 - 5.45 MIL/uL Final  . Hemoglobin 04/25/2017 10.3* 11.6 - 15.9 g/dL Final  . HCT 04/25/2017 30.6* 34.8 - 46.6 % Final  . MCV 04/25/2017 89.3  79.5 - 101.0 fL Final  . MCH 04/25/2017 30.1  25.1 - 34.0 pg Final  . MCHC 04/25/2017 33.8  31.5 - 36.0 g/dL Final  . RDW 04/25/2017 15.9* 11.2 - 14.5 % Final  . Platelet Count 04/25/2017 195  145 - 400 K/uL Final  . Neutrophils Relative % 04/25/2017 56  % Final  . Neutro Abs 04/25/2017 1.0* 1.5 - 6.5 K/uL Final  . Lymphocytes Relative 04/25/2017 35  % Final  . Lymphs Abs 04/25/2017 0.6* 0.9 - 3.3 K/uL Final  . Monocytes Relative 04/25/2017 4  % Final  . Monocytes Absolute 04/25/2017 0.1  0.1 - 0.9 K/uL Final  . Eosinophils Relative 04/25/2017 4  % Final  . Eosinophils Absolute 04/25/2017 0.1  0.0 - 0.5 K/uL Final  . Basophils Relative 04/25/2017 1  % Final  . Basophils Absolute 04/25/2017 0.0  0.0 - 0.1 K/uL Final   Performed at [facility], [address] [place], [place] [address]    (this displays the last labs from the last 3 days)  No results found for: TOTALPROTELP, ALBUMINELP, A1GS, A2GS, BETS, BETA2SER, GAMS, MSPIKE, SPEI (this displays SPEP labs)  No results found for: KPAFRELGTCHN, LAMBDASER, KAPLAMBRATIO (kappa/lambda light  chains)  No results found for: HGBA, HGBA2QUANT, HGBFQUANT, HGBSQUAN (Hemoglobinopathy evaluation)   No results found for: LDH  No results found for: IRON, TIBC, IRONPCTSAT (Iron and TIBC)  No results found for: FERRITIN  Urinalysis    Component Value Date/Time   COLORURINE Yellow 12/29/2008 0809   APPEARANCEUR CLEAR 12/29/2008 0809   LABSPEC 1.020 12/29/2008 0809   PHURINE 6.0 12/29/2008 0809   GLUCOSEU NEGATIVE 12/29/2008 0809   BILIRUBINUR NEGATIVE 12/29/2008 0809   KETONESUR NEGATIVE 12/29/2008 0809   UROBILINOGEN 0.2 12/29/2008 0809   NITRITE NEGATIVE 12/29/2008 0809   LEUKOCYTESUR NEGATIVE 12/29/2008 0809     STUDIES: No results found.  ELIGIBLE FOR AVAILABLE RESEARCH PROTOCOL: UPBEAT, ASA: Referral placed 02/28/2017  ASSESSMENT: 62 y.o. [place] woman s/p right breast lower inner quadrant biopsy 01/11/2017 for a cT1c cN0, stage IA invasive ductal breast cancer, estrogen receptor positive, progesterone receptor negativem HER-2 not amplified, with an Mib-1 of 20%  (1) genetics testing 03/22/2017 through the Hereditary Gene Panel offered by [facility] found no deleterious mutations in APC, ATM, AXIN2, BARD1, BMPR1A, BRCA1, BRCA2, BRIP1, CDH1, CDK4, CDKN2A (p14ARF), CDKN2A (p16INK4a), CHEK2, CTNNA1, DICER1, EPCAM (Deletion/duplication testing only), GREM1 (promoter region deletion/duplication testing only), KIT, MEN1, MLH1, MSH2, MSH3, MSH6, MUTYH, NBN, NF1, NHTL1, PALB2, PDGFRA, PMS2, POLD1, POLE, PTEN, RAD50, RAD51C, RAD51D, SDHB, SDHC, SDHD, SMAD4, SMARCA4. STK11, TP53, TSC1, TSC2, and VHL.  The following genes were evaluated for sequence changes only: SDHA and HOXB13 c.251G>A variant only.    (2) status post right lumpectomy and  sentinel lymph node sampling 01/25/2017 for a pT1c pN0, stage Ia invasive ductal carcinoma, grade 2, with negative margins.  (3) Oncotype DX score of 42 predicts a 9-year risk of recurrence outside the breast of 30% if the patient's only systemic  therapy is tamoxifen for 5 years.  It also predicts a significant chemotherapy benefit.  (4) adjuvant chemotherapy will consist of cyclophosphamide and doxorubicin in dose dense fashion x4 starting 03/14/2017 followed by weekly paclitaxel x12  (a) cycle 3 delayed 1 week because of poor tolerance  (5) adjuvant radiation to follow therapy  (6) antiestrogens to start at the completion of local treatment  PLAN:  Reilyn did moderately well with cycle 3 and she has a good neutrophil count so does not need to current antibiotics.  On the other hand she is extremely fatigued.  I think if we proceeded to treatment next week she really would not be able to start the Taxol on time.  If she is going to need time off the best time to do it is now.  She requested on her own initiative anyway because she has [place] to get through.  Accordingly her next treatment, her final dose of cyclophosphamide and doxorubicin, will be 05/09/2017.  She will see me again the following week and at that point we will discuss the possible toxicity side effects and complications of weekly paclitaxel which she will start on 05/23/2017.  In the meantime I have encouraged her to start a walking program  She knows to call for any problems that may develop before her next visit.  Makar Slatter, Virgie Dad, MD  04/25/17 11:26 AM Medical Oncology and Hematology [facility] [address] [place], [place] [address] Tel. [phone]    Fax. [phone]  This document serves as a record of services personally performed by Lurline Del, MD. It was created on his behalf by Sheron Nightingale, a trained medical scribe. The creation of this record is based on the scribe's personal observations and the provider's statements to them.   I have reviewed the above documentation for accuracy and completeness, and I agree with the above.

## 2017-04-25 ENCOUNTER — Ambulatory Visit: Payer: BC Managed Care – PPO

## 2017-04-25 ENCOUNTER — Inpatient Hospital Stay (HOSPITAL_BASED_OUTPATIENT_CLINIC_OR_DEPARTMENT_OTHER): Payer: BC Managed Care – PPO | Admitting: Oncology

## 2017-04-25 ENCOUNTER — Other Ambulatory Visit: Payer: BC Managed Care – PPO

## 2017-04-25 ENCOUNTER — Telehealth: Payer: Self-pay | Admitting: Oncology

## 2017-04-25 ENCOUNTER — Ambulatory Visit: Payer: BC Managed Care – PPO | Admitting: Oncology

## 2017-04-25 ENCOUNTER — Inpatient Hospital Stay: Payer: BC Managed Care – PPO

## 2017-04-25 VITALS — BP 123/73 | HR 95 | Temp 98.5°F | Resp 18 | Ht 64.0 in | Wt 231.8 lb

## 2017-04-25 DIAGNOSIS — C50311 Malignant neoplasm of lower-inner quadrant of right female breast: Secondary | ICD-10-CM | POA: Diagnosis not present

## 2017-04-25 DIAGNOSIS — Z170 Estrogen receptor positive status [ER+]: Secondary | ICD-10-CM

## 2017-04-25 DIAGNOSIS — Z87891 Personal history of nicotine dependence: Secondary | ICD-10-CM | POA: Diagnosis not present

## 2017-04-25 DIAGNOSIS — Z95828 Presence of other vascular implants and grafts: Secondary | ICD-10-CM

## 2017-04-25 LAB — CBC WITH DIFFERENTIAL (CANCER CENTER ONLY)
BASOS ABS: 0 10*3/uL (ref 0.0–0.1)
Basophils Relative: 1 %
Eosinophils Absolute: 0.1 10*3/uL (ref 0.0–0.5)
Eosinophils Relative: 4 %
HEMATOCRIT: 30.6 % — AB (ref 34.8–46.6)
HEMOGLOBIN: 10.3 g/dL — AB (ref 11.6–15.9)
LYMPHS PCT: 35 %
Lymphs Abs: 0.6 10*3/uL — ABNORMAL LOW (ref 0.9–3.3)
MCH: 30.1 pg (ref 25.1–34.0)
MCHC: 33.8 g/dL (ref 31.5–36.0)
MCV: 89.3 fL (ref 79.5–101.0)
MONO ABS: 0.1 10*3/uL (ref 0.1–0.9)
Monocytes Relative: 4 %
NEUTROS ABS: 1 10*3/uL — AB (ref 1.5–6.5)
NEUTROS PCT: 56 %
Platelet Count: 195 10*3/uL (ref 145–400)
RBC: 3.43 MIL/uL — AB (ref 3.70–5.45)
RDW: 15.9 % — AB (ref 11.2–14.5)
WBC Count: 1.8 10*3/uL — ABNORMAL LOW (ref 3.9–10.3)

## 2017-04-25 LAB — CMP (CANCER CENTER ONLY)
ALBUMIN: 3.3 g/dL — AB (ref 3.5–5.0)
ALK PHOS: 75 U/L (ref 40–150)
ALT: 44 U/L (ref 0–55)
AST: 14 U/L (ref 5–34)
Anion gap: 8 (ref 3–11)
BILIRUBIN TOTAL: 0.5 mg/dL (ref 0.2–1.2)
BUN: 13 mg/dL (ref 7–26)
CALCIUM: 9.1 mg/dL (ref 8.4–10.4)
CO2: 25 mmol/L (ref 22–29)
CREATININE: 0.67 mg/dL (ref 0.60–1.10)
Chloride: 103 mmol/L (ref 98–109)
GFR, Est AFR Am: >60 mL/min (ref >60)
Glucose, Bld: 124 mg/dL (ref 70–140)
Potassium: 4 mmol/L (ref 3.5–5.1)
Sodium: 136 mmol/L (ref 136–145)
TOTAL PROTEIN: 6.5 g/dL (ref 6.4–8.3)

## 2017-04-25 MED ORDER — SODIUM CHLORIDE 0.9% FLUSH
10.0000 mL | INTRAVENOUS | Status: DC | PRN
  Administered 2017-04-25: 10 mL
  Filled 2017-04-25: qty 10

## 2017-04-25 MED ORDER — HEPARIN SOD (PORK) LOCK FLUSH 100 UNIT/ML IV SOLN
500.0000 [IU] | Freq: Once | INTRAVENOUS | Status: AC | PRN
  Administered 2017-04-25: 500 [IU]
  Filled 2017-04-25: qty 5

## 2017-04-25 NOTE — Telephone Encounter (Signed)
Gave avs and calendar ° °

## 2017-04-26 ENCOUNTER — Ambulatory Visit: Payer: BC Managed Care – PPO

## 2017-05-09 ENCOUNTER — Inpatient Hospital Stay: Payer: BC Managed Care – PPO

## 2017-05-09 ENCOUNTER — Encounter: Payer: Self-pay | Admitting: Adult Health

## 2017-05-09 ENCOUNTER — Inpatient Hospital Stay: Payer: BC Managed Care – PPO | Attending: Oncology

## 2017-05-09 ENCOUNTER — Inpatient Hospital Stay (HOSPITAL_BASED_OUTPATIENT_CLINIC_OR_DEPARTMENT_OTHER): Payer: BC Managed Care – PPO | Admitting: Adult Health

## 2017-05-09 VITALS — BP 120/63 | HR 78 | Temp 98.3°F | Resp 18

## 2017-05-09 DIAGNOSIS — Z95828 Presence of other vascular implants and grafts: Secondary | ICD-10-CM

## 2017-05-09 DIAGNOSIS — C50311 Malignant neoplasm of lower-inner quadrant of right female breast: Secondary | ICD-10-CM | POA: Insufficient documentation

## 2017-05-09 DIAGNOSIS — Z5189 Encounter for other specified aftercare: Secondary | ICD-10-CM | POA: Insufficient documentation

## 2017-05-09 DIAGNOSIS — Z170 Estrogen receptor positive status [ER+]: Secondary | ICD-10-CM

## 2017-05-09 DIAGNOSIS — I10 Essential (primary) hypertension: Secondary | ICD-10-CM | POA: Insufficient documentation

## 2017-05-09 DIAGNOSIS — E669 Obesity, unspecified: Secondary | ICD-10-CM | POA: Insufficient documentation

## 2017-05-09 DIAGNOSIS — Z79899 Other long term (current) drug therapy: Secondary | ICD-10-CM | POA: Insufficient documentation

## 2017-05-09 DIAGNOSIS — Z5111 Encounter for antineoplastic chemotherapy: Secondary | ICD-10-CM | POA: Diagnosis not present

## 2017-05-09 LAB — CBC WITH DIFFERENTIAL (CANCER CENTER ONLY)
Basophils Absolute: 0 10*3/uL (ref 0.0–0.1)
Basophils Relative: 0 %
Eosinophils Absolute: 0 10*3/uL (ref 0.0–0.5)
Eosinophils Relative: 0 %
HCT: 34.8 % (ref 34.8–46.6)
HEMOGLOBIN: 11.5 g/dL — AB (ref 11.6–15.9)
LYMPHS ABS: 1.3 10*3/uL (ref 0.9–3.3)
LYMPHS PCT: 18 %
MCH: 30.5 pg (ref 25.1–34.0)
MCHC: 33 g/dL (ref 31.5–36.0)
MCV: 92.3 fL (ref 79.5–101.0)
MONOS PCT: 9 %
Monocytes Absolute: 0.7 10*3/uL (ref 0.1–0.9)
NEUTROS ABS: 5.5 10*3/uL (ref 1.5–6.5)
NEUTROS PCT: 73 %
Platelet Count: 271 10*3/uL (ref 145–400)
RBC: 3.77 MIL/uL (ref 3.70–5.45)
RDW: 17.4 % — ABNORMAL HIGH (ref 11.2–14.5)
WBC Count: 7.6 10*3/uL (ref 3.9–10.3)

## 2017-05-09 LAB — CMP (CANCER CENTER ONLY)
ALT: 44 U/L (ref 0–55)
ANION GAP: 10 (ref 3–11)
AST: 27 U/L (ref 5–34)
Albumin: 4 g/dL (ref 3.5–5.0)
Alkaline Phosphatase: 50 U/L (ref 40–150)
BUN: 12 mg/dL (ref 7–26)
CHLORIDE: 104 mmol/L (ref 98–109)
CO2: 25 mmol/L (ref 22–29)
CREATININE: 0.76 mg/dL (ref 0.60–1.10)
Calcium: 10 mg/dL (ref 8.4–10.4)
GFR, Estimated: >60 mL/min (ref >60)
Glucose, Bld: 104 mg/dL (ref 70–140)
POTASSIUM: 4.1 mmol/L (ref 3.5–5.1)
SODIUM: 139 mmol/L (ref 136–145)
Total Bilirubin: 0.3 mg/dL (ref 0.2–1.2)
Total Protein: 7.1 g/dL (ref 6.4–8.3)

## 2017-05-09 MED ORDER — PALONOSETRON HCL INJECTION 0.25 MG/5ML
0.2500 mg | Freq: Once | INTRAVENOUS | Status: AC
  Administered 2017-05-09: 0.25 mg via INTRAVENOUS

## 2017-05-09 MED ORDER — DOXORUBICIN HCL CHEMO IV INJECTION 2 MG/ML
60.0000 mg/m2 | Freq: Once | INTRAVENOUS | Status: AC
  Administered 2017-05-09: 128 mg via INTRAVENOUS
  Filled 2017-05-09: qty 64

## 2017-05-09 MED ORDER — CYCLOPHOSPHAMIDE CHEMO INJECTION 1 GM
600.0000 mg/m2 | Freq: Once | INTRAMUSCULAR | Status: AC
  Administered 2017-05-09: 1280 mg via INTRAVENOUS
  Filled 2017-05-09: qty 64

## 2017-05-09 MED ORDER — HEPARIN SOD (PORK) LOCK FLUSH 100 UNIT/ML IV SOLN
500.0000 [IU] | Freq: Once | INTRAVENOUS | Status: AC | PRN
  Administered 2017-05-09: 500 [IU]
  Filled 2017-05-09: qty 5

## 2017-05-09 MED ORDER — FOSAPREPITANT DIMEGLUMINE INJECTION 150 MG
Freq: Once | INTRAVENOUS | Status: AC
  Administered 2017-05-09: 14:00:00 via INTRAVENOUS
  Filled 2017-05-09: qty 5

## 2017-05-09 MED ORDER — SODIUM CHLORIDE 0.9% FLUSH
10.0000 mL | INTRAVENOUS | Status: DC | PRN
  Administered 2017-05-09: 10 mL
  Filled 2017-05-09: qty 10

## 2017-05-09 MED ORDER — SODIUM CHLORIDE 0.9 % IV SOLN
Freq: Once | INTRAVENOUS | Status: AC
  Administered 2017-05-09: 14:00:00 via INTRAVENOUS

## 2017-05-09 NOTE — Patient Instructions (Signed)
[facility] Discharge Instructions for Patients Receiving Chemotherapy  Today you received the following chemotherapy agents Adriamycin and Cytoxan  To help prevent nausea and vomiting after your treatment, we encourage you to take your nausea medication as directed.  If you develop nausea and vomiting that is not controlled by your nausea medication, call the clinic.   BELOW ARE SYMPTOMS THAT SHOULD BE REPORTED IMMEDIATELY:  *FEVER GREATER THAN 100.5 F  *CHILLS WITH OR WITHOUT FEVER  NAUSEA AND VOMITING THAT IS NOT CONTROLLED WITH YOUR NAUSEA MEDICATION  *UNUSUAL SHORTNESS OF BREATH  *UNUSUAL BRUISING OR BLEEDING  TENDERNESS IN MOUTH AND THROAT WITH OR WITHOUT PRESENCE OF ULCERS  *URINARY PROBLEMS  *BOWEL PROBLEMS  UNUSUAL RASH Items with * indicate a potential emergency and should be followed up as soon as possible.  Feel free to call the clinic should you have any questions or concerns. The clinic phone number is [phone].  Please show the CHEMO ALERT CARD at check-in to the Emergency Department and triage nurse.   

## 2017-05-09 NOTE — Progress Notes (Signed)
[place]  [phone] [phone]     ID: Shelby Payne DOB: [birth year]  MR#: [id number]  NHA#:[id number]  Patient Care Team: Hoyt Koch, MD as PCP - General (Internal Medicine) Magrinat, Virgie Dad, MD as Consulting Physician (Oncology) Eppie Gibson, MD as Attending Physician (Radiation Oncology) Erroll Luna, MD as Consulting Physician (General Surgery) Christene Slates, MD as Physician Assistant (Radiology) OTHER MD:  CHIEF COMPLAINT: Estrogen receptor positive breast cancer  CURRENT TREATMENT: Adjuvant chemotherapy    HISTORY OF CURRENT ILLNESS: From the original intake note:  The patient had bilateral screening mammography at [facility] 12/28/2016.  There was a new mass in the right breast lower inner quadrant and some new grouped calcifications in the right breast same quadrant.  Accordingly the patient was called back for right diagnostic mammography and ultrasonography 01/09/2017.  The breast density was category A.  In the right breast lower inner quadrant there was a new irregular mass.  Again noted was a new group of heterogeneous calcifications in the same quadrant.  Ultrasound confirmed a 1.7 cm irregular mass in the right breast lower inner quadrant.  There were no abnormalities sonographically in the right axilla.  Taken together,  the mass plus calcifications measured up to 4 cm.  Accordingly on 01/11/2017 she underwent biopsy of the right breast mass in question, showing [phone]) invasive ductal carcinoma, grade 2, estrogen receptor 90% positive with strong staining intensity, progesterone receptor negative, with an Mib-1 of 20% and no HER-2 amplification, the signals ratio being 1.26 and the number per cell 2.20  The patient's subsequent history is as detailed below.  INTERVAL HISTORY: Shelby Payne returns today for follow up and treatment of her estrogen receptor positive breast cancer. She is here for evaluation prior to  receiving cycle 4 day 1 of chemotherapy with Doxorubicin and cyclophosphamide.     REVIEW OF SYSTEMS: Shelby Payne is doing well today.  She waited an extra week to undergo her fourth and final Doxorubicin Cyclophosphamide.  She is feeling much improved today as compared to how she felt this time last week.  She denies any issues such as fevers, chills, nausea, vomiting, constipation, diarrhea, mucositis, or any other concerns.  A detailed ROS was conducted and other than fatigue, it was non contributory.    PAST MEDICAL HISTORY: Past Medical History:  Diagnosis Date  . Acute sinusitis, unspecified   . Anxiety   . Cancer ([place]) 12/2016   right breast cancer  . Depression   . Family history of prostate cancer   . Hypertension   . Obesity, unspecified   . Pneumonia, organism unspecified(486)     PAST SURGICAL HISTORY: Past Surgical History:  Procedure Laterality Date  . BREAST LUMPECTOMY WITH RADIOACTIVE SEED AND SENTINEL LYMPH NODE BIOPSY Right 01/25/2017   Procedure: RIGHT BREAST LUMPECTOMY  WITH 2 RADIOACTIVE SEEDS AND SENTINEL LYMPH NODE BIOPSY;  Surgeon: Erroll Luna, MD;  Location: [facility];  Service: General;  Laterality: Right;  . PORTACATH PLACEMENT Right 03/13/2017   Procedure: ULTRASOUND GUIDED INSERTION PORT-A-CATH RIGHT INTERNAL JUGULAR;  Surgeon: Erroll Luna, MD;  Location: [facility];  Service: General;  Laterality: Right;  . WISDOM TOOTH EXTRACTION      FAMILY HISTORY Family History  Problem Relation Age of Onset  . Hypertension Mother   . Hypothyroidism Mother   . Hyperlipidemia Mother   . Gout Mother   . Breast cancer Mother 80  . Heart disease Father   . Hypertension Father   . Diabetes  Father   . Hypothyroidism Father   . Prostate cancer Father        dx late 63s  . Hypothyroidism Sister   . Cancer Sister        angiosarcoma - mets  . Esophageal cancer Maternal Uncle   . Stroke Paternal Aunt   . Stroke Paternal Uncle   . Heart attack  Maternal Grandfather   . ALS Paternal Grandfather   . COPD Neg Hx   . Colon cancer Neg Hx   . Rectal cancer Neg Hx   . Stomach cancer Neg Hx   Her father passed away from a massive stroke at 63. Her mother passed away from complications of breast cancer at 87. She had been diagnosed age 40. The patient's sister had angiosarcoma at 23. There is no otherhistory of breast or ovarian cancer in this famiy. The patient is of Ashkenazi extraction  GYNECOLOGIC HISTORY:  Patient's last menstrual period was 07/12/2011.  Menarche age 43, first live birth age 89, she is GXP2. Menopause 2015, never used OCPs or HR   SOCIAL HISTORY:  She is a Electrical engineer at [employer]. Her husband, [name] was a Curator at [employer] in [place], but is retired now. Her daughter [name] teaches nursing at [employer]. [name] is concerned that on her father's side a cousin has tested positive for a deleterious gene; she will try to bring [name] that information]. Son, [name] passed away. The patient does not have any grandchildren. She attends [employer].     ADVANCED DIRECTIVES:    HEALTH MAINTENANCE: Social History   Tobacco Use  . Smoking status: Never Smoker  . Smokeless tobacco: Never Used  Substance Use Topics  . Alcohol use: Yes    Comment: rare glass of wine  . Drug use: No     Colonoscopy: 2013  PAP:  Bone density:never   Allergies  Allergen Reactions  . Hctz [Hydrochlorothiazide] Other (See Comments)    Hyponatremia  . Bactrim [Sulfamethoxazole-Trimethoprim] Rash    Current Outpatient Medications  Medication Sig Dispense Refill  . b complex vitamins tablet Take 1 tablet by mouth daily.    . Coenzyme Q10 (CO Q-10 PO) Take 1 capsule by mouth daily.     [name] Kitchen dexamethasone (DECADRON) 4 MG tablet Take 2 tablets by mouth once a day on the day after chemotherapy and then take 2 tablets two times a day for 2 days. Take with food. (Patient taking differently: Take 8 mg by  mouth See admin instructions. Take 8 mg by mouth once a day on the day after chemotherapy and then take 8 mg two times a day for 2 days. Take with food.) 30 tablet 1  . lidocaine-prilocaine (EMLA) cream Apply to affected area once 30 g 3  . metoprolol succinate (TOPROL-XL) 100 MG 24 hr tablet Take 1 tablet (100 mg total) daily by mouth. 90 tablet 3  . Omega-3 Fatty Acids (FISH OIL PO) Take 1 capsule by mouth daily.     . ondansetron (ZOFRAN) 8 MG tablet Take 1 tablet (8 mg total) by mouth every 8 (eight) hours as needed for nausea or vomiting. Do not take within 3 days of chemotherapy dose. 20 tablet 0  . prochlorperazine (COMPAZINE) 10 MG tablet Take 1 tablet (10 mg total) by mouth every 6 (six) hours as needed (Nausea or vomiting). 30 tablet 1  . sertraline (ZOLOFT) 100 MG tablet Take 1 tablet (100 mg total) daily by mouth. 90 tablet 3  .  Telmisartan-Amlodipine 40-5 MG TABS Take 1 tablet daily by mouth. 90 tablet 3  . triamcinolone cream (KENALOG) 0.1 % Apply 1 application 2 (two) times daily topically. (Patient taking differently: Apply 1 application topically 2 (two) times daily as needed (for rash). ) 30 g 6  . LORazepam (ATIVAN) 0.5 MG tablet Take 1 tablet (0.5 mg total) by mouth at bedtime as needed (Nausea or vomiting). (Patient not taking: Reported on 04/18/2017) 30 tablet 0   No current facility-administered medications for this visit.    Facility-Administered Medications Ordered in Other Visits  Medication Dose Route Frequency Provider Last Rate Last Dose  . sodium chloride flush (NS) 0.9 % injection 10 mL  10 mL Intracatheter PRN Magrinat, Virgie Dad, MD   10 mL at 05/09/17 1528    OBJECTIVE:  Vitals:   05/09/17 1243  BP: 120/63  Pulse: 78  Resp: 18  Temp: 98.3 F (36.8 C)  SpO2: 98%     There is no height or weight on file to calculate BMI.   Wt Readings from Last 3 Encounters:  04/25/17 231 lb 12.8 oz (105.1 kg)  04/18/17 229 lb 4.8 oz (104 kg)  04/04/17 227 lb 8 oz (103.2  kg)  ECOG FS:1 - Symptomatic but completely ambulatory GENERAL: Patient is a well appearing female in no acute distress HEENT:  Sclerae anicteric.  Oropharynx clear and moist. No ulcerations or evidence of oropharyngeal candidiasis. Neck is supple.  NODES:  No cervical, supraclavicular, or axillary lymphadenopathy palpated.  BREAST EXAM:  Deferred. LUNGS:  Clear to auscultation bilaterally.  No wheezes or rhonchi. HEART:  Regular rate and rhythm. No murmur appreciated. ABDOMEN:  Soft, nontender.  Positive, normoactive bowel sounds. No organomegaly palpated. MSK:  No focal spinal tenderness to palpation. Full range of motion bilaterally in the upper extremities. EXTREMITIES:  No peripheral edema.   SKIN:  Clear with no obvious rashes or skin changes. No nail dyscrasia. NEURO:  Nonfocal. Well oriented.  Appropriate affect.    LAB RESULTS:  CMP     Component Value Date/Time   NA 139 05/09/2017 1200   NA 137 01/18/2017 1238   K 4.1 05/09/2017 1200   K 4.1 01/18/2017 1238   CL 104 05/09/2017 1200   CO2 25 05/09/2017 1200   CO2 22 01/18/2017 1238   GLUCOSE 104 05/09/2017 1200   GLUCOSE 91 01/18/2017 1238   BUN 12 05/09/2017 1200   BUN 19.5 01/18/2017 1238   CREATININE 0.76 05/09/2017 1200   CREATININE 0.7 01/18/2017 1238   CALCIUM 10.0 05/09/2017 1200   CALCIUM 9.8 01/18/2017 1238   PROT 7.1 05/09/2017 1200   PROT 7.9 01/18/2017 1238   ALBUMIN 4.0 05/09/2017 1200   ALBUMIN 4.3 01/18/2017 1238   AST 27 05/09/2017 1200   AST 19 01/18/2017 1238   ALT 44 05/09/2017 1200   ALT 24 01/18/2017 1238   ALKPHOS 50 05/09/2017 1200   ALKPHOS 53 01/18/2017 1238   BILITOT 0.3 05/09/2017 1200   BILITOT 0.39 01/18/2017 1238   GFRNONAA >60 05/09/2017 1200   GFRAA >60 05/09/2017 1200    No results found for: TOTALPROTELP, ALBUMINELP, A1GS, A2GS, BETS, BETA2SER, GAMS, MSPIKE, SPEI  No results found for: KPAFRELGTCHN, LAMBDASER, KAPLAMBRATIO  Lab Results  Component Value Date   WBC 7.6  05/09/2017   NEUTROABS 5.5 05/09/2017   HGB 12.6 03/13/2017   HCT 34.8 05/09/2017   MCV 92.3 05/09/2017   PLT 271 05/09/2017    _0 @  No results found for: LABCA2  No components found for: QPYPPJ093  No results for input(s): INR in the last 168 hours.  No results found for: LABCA2  No results found for: OIZ124  No results found for: PYK998  No results found for: PJA250  No results found for: CA2729  No components found for: HGQUANT  No results found for: CEA1 / No results found for: CEA1   No results found for: AFPTUMOR  No results found for: CHROMOGRNA  No results found for: PSA1  Appointment on 05/09/2017  Component Date Value Ref Range Status  . Sodium 05/09/2017 139  136 - 145 mmol/L Final  . Potassium 05/09/2017 4.1  3.5 - 5.1 mmol/L Final  . Chloride 05/09/2017 104  98 - 109 mmol/L Final  . CO2 05/09/2017 25  22 - 29 mmol/L Final  . Glucose, Bld 05/09/2017 104  70 - 140 mg/dL Final  . BUN 05/09/2017 12  7 - 26 mg/dL Final  . Creatinine 05/09/2017 0.76  0.60 - 1.10 mg/dL Final  . Calcium 05/09/2017 10.0  8.4 - 10.4 mg/dL Final  . Total Protein 05/09/2017 7.1  6.4 - 8.3 g/dL Final  . Albumin 05/09/2017 4.0  3.5 - 5.0 g/dL Final  . AST 05/09/2017 27  5 - 34 U/L Final  . ALT 05/09/2017 44  0 - 55 U/L Final  . Alkaline Phosphatase 05/09/2017 50  40 - 150 U/L Final  . Total Bilirubin 05/09/2017 0.3  0.2 - 1.2 mg/dL Final  . GFR, Est Non Af Am 05/09/2017 >60  >60 mL/min Final  . GFR, Est AFR Am 05/09/2017 >60  >60 mL/min Final   Comment: (NOTE) The eGFR has been calculated using the CKD EPI equation. This calculation has not been validated in all clinical situations. eGFR's persistently <60 mL/min signify possible Chronic Kidney Disease.   Georgiann Hahn gap 05/09/2017 10  3 - 11 Final   Performed at [facility], [place] [address], [place], [place] [address]  . WBC Count 05/09/2017 7.6  3.9 - 10.3 K/uL Final  . RBC 05/09/2017  3.77  3.70 - 5.45 MIL/uL Final  . Hemoglobin 05/09/2017 11.5* 11.6 - 15.9 g/dL Final  . HCT 05/09/2017 34.8  34.8 - 46.6 % Final  . MCV 05/09/2017 92.3  79.5 - 101.0 fL Final  . MCH 05/09/2017 30.5  25.1 - 34.0 pg Final  . MCHC 05/09/2017 33.0  31.5 - 36.0 g/dL Final  . RDW 05/09/2017 17.4* 11.2 - 14.5 % Final  . Platelet Count 05/09/2017 271  145 - 400 K/uL Final  . Neutrophils Relative % 05/09/2017 73  % Final  . Neutro Abs 05/09/2017 5.5  1.5 - 6.5 K/uL Final  . Lymphocytes Relative 05/09/2017 18  % Final  . Lymphs Abs 05/09/2017 1.3  0.9 - 3.3 K/uL Final  . Monocytes Relative 05/09/2017 9  % Final  . Monocytes Absolute 05/09/2017 0.7  0.1 - 0.9 K/uL Final  . Eosinophils Relative 05/09/2017 0  % Final  . Eosinophils Absolute 05/09/2017 0.0  0.0 - 0.5 K/uL Final  . Basophils Relative 05/09/2017 0  % Final  . Basophils Absolute 05/09/2017 0.0  0.0 - 0.1 K/uL Final   Performed at [facility], [place] [address], [place], [place] [address]    (this displays the last labs from the last 3 days)  No results found for: TOTALPROTELP, ALBUMINELP, A1GS, A2GS, BETS, BETA2SER, GAMS, MSPIKE, SPEI (this displays SPEP labs)  No results found for: KPAFRELGTCHN, LAMBDASER, KAPLAMBRATIO (kappa/lambda  light chains)  No results found for: HGBA, HGBA2QUANT, HGBFQUANT, HGBSQUAN (Hemoglobinopathy evaluation)   No results found for: LDH  No results found for: IRON, TIBC, IRONPCTSAT (Iron and TIBC)  No results found for: FERRITIN  Urinalysis    Component Value Date/Time   COLORURINE Yellow 12/29/2008 0809   APPEARANCEUR CLEAR 12/29/2008 0809   LABSPEC 1.020 12/29/2008 0809   PHURINE 6.0 12/29/2008 0809   GLUCOSEU NEGATIVE 12/29/2008 0809   BILIRUBINUR NEGATIVE 12/29/2008 0809   KETONESUR NEGATIVE 12/29/2008 0809   UROBILINOGEN 0.2 12/29/2008 0809   NITRITE NEGATIVE 12/29/2008 0809   LEUKOCYTESUR NEGATIVE 12/29/2008 0809     STUDIES: No results found.  ELIGIBLE  FOR AVAILABLE RESEARCH PROTOCOL: UPBEAT, ASA: Referral placed 02/28/2017  ASSESSMENT: 62 y.o. [place] woman s/p right breast lower inner quadrant biopsy 01/11/2017 for a cT1c cN0, stage IA invasive ductal breast cancer, estrogen receptor positive, progesterone receptor negativem HER-2 not amplified, with an Mib-1 of 20%  (1) genetics testing 03/22/2017 through the Hereditary Gene Panel offered by [facility] found no deleterious mutations in APC, ATM, AXIN2, BARD1, BMPR1A, BRCA1, BRCA2, BRIP1, CDH1, CDK4, CDKN2A (p14ARF), CDKN2A (p16INK4a), CHEK2, CTNNA1, DICER1, EPCAM (Deletion/duplication testing only), GREM1 (promoter region deletion/duplication testing only), KIT, MEN1, MLH1, MSH2, MSH3, MSH6, MUTYH, NBN, NF1, NHTL1, PALB2, PDGFRA, PMS2, POLD1, POLE, PTEN, RAD50, RAD51C, RAD51D, SDHB, SDHC, SDHD, SMAD4, SMARCA4. STK11, TP53, TSC1, TSC2, and VHL.  The following genes were evaluated for sequence changes only: SDHA and HOXB13 c.251G>A variant only.    (2) status post right lumpectomy and sentinel lymph node sampling 01/25/2017 for a pT1c pN0, stage Ia invasive ductal carcinoma, grade 2, with negative margins.  (3) Oncotype DX score of 42 predicts a 9-year risk of recurrence outside the breast of 30% if the patient's only systemic therapy is tamoxifen for 5 years.  It also predicts a significant chemotherapy benefit.  (4) adjuvant chemotherapy will consist of cyclophosphamide and doxorubicin in dose dense fashion x4 starting 03/14/2017 followed by weekly paclitaxel x12  (a) cycle 3 delayed 1 week because of poor tolerance  (5) adjuvant radiation to follow therapy  (6) antiestrogens to start at the completion of local treatment  PLAN:  Shelby Payne is doing well today.  I reviewed her labs with her in detail.  She will proceed with her fourth cycle of Doxorubicin and Cyclophosphamide.  She will return in one week for labs and f/u with Dr. Jana Hakim.  We also briefly discussed her next chemotherapy  regimen--weekly Paclitaxel.  She knows to call for any problems that may develop before her next visit.  A total of (20) minutes of face-to-face time was spent with this patient with greater than 50% of that time in counseling and care-coordination.   Wilber Bihari, NP  05/09/17 3:44 PM Medical Oncology and Hematology [facility] [address] [place], [place] [address] Tel. [phone]    Fax. [phone]

## 2017-05-10 ENCOUNTER — Telehealth: Payer: Self-pay | Admitting: Adult Health

## 2017-05-10 NOTE — Telephone Encounter (Signed)
Per 4/2 no los

## 2017-05-11 ENCOUNTER — Inpatient Hospital Stay: Payer: BC Managed Care – PPO

## 2017-05-11 DIAGNOSIS — C50311 Malignant neoplasm of lower-inner quadrant of right female breast: Secondary | ICD-10-CM

## 2017-05-11 DIAGNOSIS — Z170 Estrogen receptor positive status [ER+]: Principal | ICD-10-CM

## 2017-05-11 MED ORDER — PEGFILGRASTIM-CBQV 6 MG/0.6ML ~~LOC~~ SOSY
PREFILLED_SYRINGE | SUBCUTANEOUS | Status: AC
  Filled 2017-05-11: qty 0.6

## 2017-05-11 MED ORDER — PEGFILGRASTIM-CBQV 6 MG/0.6ML ~~LOC~~ SOSY
6.0000 mg | PREFILLED_SYRINGE | Freq: Once | SUBCUTANEOUS | Status: AC
  Administered 2017-05-11: 6 mg via SUBCUTANEOUS

## 2017-05-16 NOTE — Progress Notes (Signed)
[place]  [phone] [phone]     ID: Shelby Payne DOB: [birth year]  MR#: [id number]  QQV#:[id number]  Patient Care Team: Hoyt Koch, MD as PCP - General (Internal Medicine) Magrinat, Virgie Dad, MD as Consulting Physician (Oncology) Eppie Gibson, MD as Attending Physician (Radiation Oncology) Erroll Luna, MD as Consulting Physician (General Surgery) Christene Slates, MD as Physician Assistant (Radiology) OTHER MD:  CHIEF COMPLAINT: Estrogen receptor positive breast cancer  CURRENT TREATMENT: Adjuvant chemotherapy    HISTORY OF CURRENT ILLNESS: From the original intake note:  The patient had bilateral screening mammography at [facility] 12/28/2016.  There was a new mass in the right breast lower inner quadrant and some new grouped calcifications in the right breast same quadrant.  Accordingly the patient was called back for right diagnostic mammography and ultrasonography 01/09/2017.  The breast density was category A.  In the right breast lower inner quadrant there was a new irregular mass.  Again noted was a new group of heterogeneous calcifications in the same quadrant.  Ultrasound confirmed a 1.7 cm irregular mass in the right breast lower inner quadrant.  There were no abnormalities sonographically in the right axilla.  Taken together,  the mass plus calcifications measured up to 4 cm.  Accordingly on 01/11/2017 she underwent biopsy of the right breast mass in question, showing [phone]) invasive ductal carcinoma, grade 2, estrogen receptor 90% positive with strong staining intensity, progesterone receptor negative, with an Mib-1 of 20% and no HER-2 amplification, the signals ratio being 1.26 and the number per cell 2.20  The patient's subsequent history is as detailed below.  INTERVAL HISTORY: Shelby Payne returns today for follow up and treatment of her estrogen receptor positive breast cancer.today is day 8 cycle 4 of 4 planned  cycles of doxorubicin and cyclophosphamide, to be followed by paclitaxel weekly x12.      REVIEW OF SYSTEMS: Shelby Payne is doing well, overall. Last week after treatment she wasn't as nauseated as before. She did have some bowel discomfort that quickly resolved after taking some imodium. This morning she was constipated and noticed bloody hemorroids. Her energy has been low, but slowly improving. About once a year she will get sore, tender, bumps on the sides of her tongue, which she currently has at the moment, but are not different from past onsets. She denies unusual headaches, visual changes, vomiting, or dizziness. There has been no unusual cough, phlegm production, or pleurisy. This been no change in bladder habits. She denies unexplained weight loss, bleeding, rash, or fever. A detailed review of systems was otherwise noncontributory.   PAST MEDICAL HISTORY: Past Medical History:  Diagnosis Date  . Acute sinusitis, unspecified   . Anxiety   . Cancer ([place]) 12/2016   right breast cancer  . Depression   . Family history of prostate cancer   . Hypertension   . Obesity, unspecified   . Pneumonia, organism unspecified(486)     PAST SURGICAL HISTORY: Past Surgical History:  Procedure Laterality Date  . BREAST LUMPECTOMY WITH RADIOACTIVE SEED AND SENTINEL LYMPH NODE BIOPSY Right 01/25/2017   Procedure: RIGHT BREAST LUMPECTOMY  WITH 2 RADIOACTIVE SEEDS AND SENTINEL LYMPH NODE BIOPSY;  Surgeon: Erroll Luna, MD;  Location: [place];  Service: General;  Laterality: Right;  . PORTACATH PLACEMENT Right 03/13/2017   Procedure: ULTRASOUND GUIDED INSERTION PORT-A-CATH RIGHT INTERNAL JUGULAR;  Surgeon: Erroll Luna, MD;  Location: [place];  Service: General;  Laterality: Right;  . WISDOM TOOTH EXTRACTION  FAMILY HISTORY Family History  Problem Relation Age of Onset  . Hypertension Mother   . Hypothyroidism Mother   . Hyperlipidemia Mother   . Gout Mother   . Breast cancer  Mother 31  . Heart disease Father   . Hypertension Father   . Diabetes Father   . Hypothyroidism Father   . Prostate cancer Father        dx late 71s  . Hypothyroidism Sister   . Cancer Sister        angiosarcoma - mets  . Esophageal cancer Maternal Uncle   . Stroke Paternal Aunt   . Stroke Paternal Uncle   . Heart attack Maternal Grandfather   . ALS Paternal Grandfather   . COPD Neg Hx   . Colon cancer Neg Hx   . Rectal cancer Neg Hx   . Stomach cancer Neg Hx   Her father passed away from a massive stroke at 81. Her mother passed away from complications of breast cancer at 44. She had been diagnosed age 64. The patient's sister had angiosarcoma at 76. There is no otherhistory of breast or ovarian cancer in this famiy. The patient is of Ashkenazi extraction  GYNECOLOGIC HISTORY:  Patient's last menstrual period was 07/12/2011.  Menarche age 2, first live birth age 23, she is GXP2. Menopause 2015, never used OCPs or HR   SOCIAL HISTORY:  She is a Electrical engineer at [employer]. Her husband, [name] was a Curator at [employer] in [place], but is retired now. Her daughter [name] teaches nursing at [employer]. [name] is concerned that on her father's side a cousin has tested positive for a deleterious gene; she will try to bring [name] that information]. Son, [name] passed away. The patient does not have any grandchildren. She attends [employer].     ADVANCED DIRECTIVES:    HEALTH MAINTENANCE: Social History   Tobacco Use  . Smoking status: Never Smoker  . Smokeless tobacco: Never Used  Substance Use Topics  . Alcohol use: Yes    Comment: rare glass of wine  . Drug use: No     Colonoscopy: 2013  PAP:  Bone density:never   Allergies  Allergen Reactions  . Hctz [Hydrochlorothiazide] Other (See Comments)    Hyponatremia  . Bactrim [Sulfamethoxazole-Trimethoprim] Rash    Current Outpatient Medications  Medication Sig Dispense Refill  .  b complex vitamins tablet Take 1 tablet by mouth daily.    . Coenzyme Q10 (CO Q-10 PO) Take 1 capsule by mouth daily.     [name] Kitchen dexamethasone (DECADRON) 4 MG tablet Take 2 tablets by mouth once a day on the day after chemotherapy and then take 2 tablets two times a day for 2 days. Take with food. (Patient taking differently: Take 8 mg by mouth See admin instructions. Take 8 mg by mouth once a day on the day after chemotherapy and then take 8 mg two times a day for 2 days. Take with food.) 30 tablet 1  . lidocaine-prilocaine (EMLA) cream Apply to affected area once 30 g 3  . LORazepam (ATIVAN) 0.5 MG tablet Take 1 tablet (0.5 mg total) by mouth at bedtime as needed (Nausea or vomiting). (Patient not taking: Reported on 04/18/2017) 30 tablet 0  . metoprolol succinate (TOPROL-XL) 100 MG 24 hr tablet Take 1 tablet (100 mg total) daily by mouth. 90 tablet 3  . Omega-3 Fatty Acids (FISH OIL PO) Take 1 capsule by mouth daily.     . ondansetron (  ZOFRAN) 8 MG tablet Take 1 tablet (8 mg total) by mouth every 8 (eight) hours as needed for nausea or vomiting. Do not take within 3 days of chemotherapy dose. 20 tablet 0  . prochlorperazine (COMPAZINE) 10 MG tablet Take 1 tablet (10 mg total) by mouth every 6 (six) hours as needed (Nausea or vomiting). 30 tablet 1  . sertraline (ZOLOFT) 100 MG tablet Take 1 tablet (100 mg total) daily by mouth. 90 tablet 3  . Telmisartan-Amlodipine 40-5 MG TABS Take 1 tablet daily by mouth. 90 tablet 3  . triamcinolone cream (KENALOG) 0.1 % Apply 1 application 2 (two) times daily topically. (Patient taking differently: Apply 1 application topically 2 (two) times daily as needed (for rash). ) 30 g 6   No current facility-administered medications for this visit.     OBJECTIVE: Middle-aged white woman who appears stated age  62:   05/17/17 1222  BP: 117/70  Pulse: 87  Resp: 18  Temp: 98.7 F (37.1 C)  SpO2: 97%     Body mass index is 40.23 kg/m.   Wt Readings from Last 3  Encounters:  05/17/17 234 lb 6.4 oz (106.3 kg)  04/25/17 231 lb 12.8 oz (105.1 kg)  04/18/17 229 lb 4.8 oz (104 kg)  ECOG FS:1 - Symptomatic but completely ambulatory  Sclerae unicteric, EOMs intact Oropharynx clear and moist No cervical or supraclavicular adenopathy Lungs no rales or rhonchi Heart regular rate and rhythm Abd soft, nontender, positive bowel sounds MSK no focal spinal tenderness, no upper extremity lymphedema Neuro: nonfocal, well oriented, appropriate affect Breasts: Deferred    LAB RESULTS:  CMP     Component Value Date/Time   NA 139 05/09/2017 1200   NA 137 01/18/2017 1238   K 4.1 05/09/2017 1200   K 4.1 01/18/2017 1238   CL 104 05/09/2017 1200   CO2 25 05/09/2017 1200   CO2 22 01/18/2017 1238   GLUCOSE 104 05/09/2017 1200   GLUCOSE 91 01/18/2017 1238   BUN 12 05/09/2017 1200   BUN 19.5 01/18/2017 1238   CREATININE 0.76 05/09/2017 1200   CREATININE 0.7 01/18/2017 1238   CALCIUM 10.0 05/09/2017 1200   CALCIUM 9.8 01/18/2017 1238   PROT 7.1 05/09/2017 1200   PROT 7.9 01/18/2017 1238   ALBUMIN 4.0 05/09/2017 1200   ALBUMIN 4.3 01/18/2017 1238   AST 27 05/09/2017 1200   AST 19 01/18/2017 1238   ALT 44 05/09/2017 1200   ALT 24 01/18/2017 1238   ALKPHOS 50 05/09/2017 1200   ALKPHOS 53 01/18/2017 1238   BILITOT 0.3 05/09/2017 1200   BILITOT 0.39 01/18/2017 1238   GFRNONAA >60 05/09/2017 1200   GFRAA >60 05/09/2017 1200    No results found for: TOTALPROTELP, ALBUMINELP, A1GS, A2GS, BETS, BETA2SER, GAMS, MSPIKE, SPEI  No results found for: KPAFRELGTCHN, LAMBDASER, KAPLAMBRATIO  Lab Results  Component Value Date   WBC 3.0 (L) 05/17/2017   NEUTROABS 1.7 05/17/2017   HGB 12.6 03/13/2017   HCT 32.0 (L) 05/17/2017   MCV 90.7 05/17/2017   PLT 167 05/17/2017    _0 @  No results found for: LABCA2  No components found for: NIOEVO350  No results for input(s): INR in the last 168 hours.  No results found for: LABCA2  No results  found for: KXF818  No results found for: EXH371  No results found for: IRC789  No results found for: CA2729  No components found for: HGQUANT  No results found for: CEA1 / No results found for: CEA1  No results found for: AFPTUMOR  No results found for: [place]  No results found for: PSA1  Appointment on 05/17/2017  Component Date Value Ref Range Status  . WBC Count 05/17/2017 3.0* 3.9 - 10.3 K/uL Final  . RBC 05/17/2017 3.53* 3.70 - 5.45 MIL/uL Final  . Hemoglobin 05/17/2017 10.8* 11.6 - 15.9 g/dL Final  . HCT 05/17/2017 32.0* 34.8 - 46.6 % Final  . MCV 05/17/2017 90.7  79.5 - 101.0 fL Final  . MCH 05/17/2017 30.4  25.1 - 34.0 pg Final  . MCHC 05/17/2017 33.6  31.5 - 36.0 g/dL Final  . RDW 05/17/2017 16.9* 11.2 - 14.5 % Final  . Platelet Count 05/17/2017 167  145 - 400 K/uL Final  . Neutrophils Relative % 05/17/2017 59  % Final  . Neutro Abs 05/17/2017 1.7  1.5 - 6.5 K/uL Final  . Lymphocytes Relative 05/17/2017 27  % Final  . Lymphs Abs 05/17/2017 0.8* 0.9 - 3.3 K/uL Final  . Monocytes Relative 05/17/2017 11  % Final  . Monocytes Absolute 05/17/2017 0.3  0.1 - 0.9 K/uL Final  . Eosinophils Relative 05/17/2017 2  % Final  . Eosinophils Absolute 05/17/2017 0.1  0.0 - 0.5 K/uL Final  . Basophils Relative 05/17/2017 1  % Final  . Basophils Absolute 05/17/2017 0.0  0.0 - 0.1 K/uL Final   Performed at [facility], [address] [place], [place] [address]    (this displays the last labs from the last 3 days)  No results found for: TOTALPROTELP, ALBUMINELP, A1GS, A2GS, BETS, BETA2SER, GAMS, MSPIKE, SPEI (this displays SPEP labs)  No results found for: KPAFRELGTCHN, LAMBDASER, KAPLAMBRATIO (kappa/lambda light chains)  No results found for: HGBA, HGBA2QUANT, HGBFQUANT, HGBSQUAN (Hemoglobinopathy evaluation)   No results found for: LDH  No results found for: IRON, TIBC, IRONPCTSAT (Iron and TIBC)  No results found for:  FERRITIN  Urinalysis    Component Value Date/Time   COLORURINE Yellow 12/29/2008 0809   APPEARANCEUR CLEAR 12/29/2008 0809   LABSPEC 1.020 12/29/2008 0809   PHURINE 6.0 12/29/2008 0809   GLUCOSEU NEGATIVE 12/29/2008 0809   BILIRUBINUR NEGATIVE 12/29/2008 0809   KETONESUR NEGATIVE 12/29/2008 0809   UROBILINOGEN 0.2 12/29/2008 0809   NITRITE NEGATIVE 12/29/2008 0809   LEUKOCYTESUR NEGATIVE 12/29/2008 0809     STUDIES: No results found.  ELIGIBLE FOR AVAILABLE RESEARCH PROTOCOL: UPBEAT, ASA: Referral placed 02/28/2017  ASSESSMENT: 62 y.o. [place] woman s/p right breast lower inner quadrant biopsy 01/11/2017 for a cT1c cN0, stage IA invasive ductal breast cancer, estrogen receptor positive, progesterone receptor negativem HER-2 not amplified, with an Mib-1 of 20%  (1) genetics testing 03/22/2017 through the Hereditary Gene Panel offered by [facility] found no deleterious mutations in APC, ATM, AXIN2, BARD1, BMPR1A, BRCA1, BRCA2, BRIP1, CDH1, CDK4, CDKN2A (p14ARF), CDKN2A (p16INK4a), CHEK2, CTNNA1, DICER1, EPCAM (Deletion/duplication testing only), GREM1 (promoter region deletion/duplication testing only), KIT, MEN1, MLH1, MSH2, MSH3, MSH6, MUTYH, NBN, NF1, NHTL1, PALB2, PDGFRA, PMS2, POLD1, POLE, PTEN, RAD50, RAD51C, RAD51D, SDHB, SDHC, SDHD, SMAD4, SMARCA4. STK11, TP53, TSC1, TSC2, and VHL.  The following genes were evaluated for sequence changes only: SDHA and HOXB13 c.251G>A variant only.    (2) status post right lumpectomy and sentinel lymph node sampling 01/25/2017 for a pT1c pN0, stage Ia invasive ductal carcinoma, grade 2, with negative margins.  (3) Oncotype DX score of 42 predicts a 9-year risk of recurrence outside the breast of 30% if the patient's only systemic therapy is tamoxifen for 5 years.  It also predicts  a significant chemotherapy benefit.  (4) adjuvant chemotherapy will consist of cyclophosphamide and doxorubicin in dose dense fashion x4 starting 03/14/2017 followed by  weekly paclitaxel x12  (a) cycle 3 delayed 1 week because of poor tolerance  (5) adjuvant radiation to follow therapy  (6) antiestrogens to start at the completion of local treatment  PLAN:  Jatoya completed the first and more difficult part of her chemotherapy treatments.  There was only minimal delay and no dose reductions.  She gained 8 pounds through the treatment, which is acceptable.  There is no evidence of endorgan damage.  Today we spent most of the appointment discussing the upcoming treatments which will be paclitaxel weekly.  She has a good understanding of the possible toxicities, side effects and complications including the concerns regarding peripheral neuropathy.  We discussed what the initial symptoms of neuropathy are and I have urged her to let us know if they develop particularly if she is coming on day when we are not seeing her.  She should not be treated if she has any new neuropathy concerns until she sees either me or my 74 assistant  She will have her first Taxol on 05/23/2017.  She will see me with the second dose the following week to make sure she tolerated it well  She knows to call for any problems that may develop before that visit.  Magrinat, Virgie Dad, MD  05/17/17 12:54 PM Medical Oncology and Hematology [facility] [address] [place], [place] [address] Tel. [phone]    Fax. [phone]  This document serves as a record of services personally performed by Chauncey Cruel, MD. It was created on his behalf by Margit Banda, a trained medical scribe. The creation of this record is based on the scribe's personal observations and the provider's statements to them.   I have reviewed the above documentation for accuracy and completeness, and I agree with the above.

## 2017-05-17 ENCOUNTER — Inpatient Hospital Stay (HOSPITAL_BASED_OUTPATIENT_CLINIC_OR_DEPARTMENT_OTHER): Payer: BC Managed Care – PPO | Admitting: Oncology

## 2017-05-17 ENCOUNTER — Inpatient Hospital Stay: Payer: BC Managed Care – PPO

## 2017-05-17 VITALS — BP 117/70 | HR 87 | Temp 98.7°F | Resp 18 | Ht 64.0 in | Wt 234.4 lb

## 2017-05-17 DIAGNOSIS — C50311 Malignant neoplasm of lower-inner quadrant of right female breast: Secondary | ICD-10-CM | POA: Diagnosis not present

## 2017-05-17 DIAGNOSIS — Z95828 Presence of other vascular implants and grafts: Secondary | ICD-10-CM

## 2017-05-17 DIAGNOSIS — Z170 Estrogen receptor positive status [ER+]: Secondary | ICD-10-CM

## 2017-05-17 DIAGNOSIS — I10 Essential (primary) hypertension: Secondary | ICD-10-CM | POA: Diagnosis not present

## 2017-05-17 LAB — CMP (CANCER CENTER ONLY)
ALBUMIN: 3.6 g/dL (ref 3.5–5.0)
ALT: 24 U/L (ref 0–55)
ANION GAP: 8 (ref 3–11)
AST: 13 U/L (ref 5–34)
Alkaline Phosphatase: 79 U/L (ref 40–150)
BUN: 13 mg/dL (ref 7–26)
CHLORIDE: 104 mmol/L (ref 98–109)
CO2: 26 mmol/L (ref 22–29)
Calcium: 9.3 mg/dL (ref 8.4–10.4)
Creatinine: 0.7 mg/dL (ref 0.60–1.10)
GFR, Est AFR Am: >60 mL/min (ref >60)
GFR, Estimated: >60 mL/min (ref >60)
GLUCOSE: 111 mg/dL (ref 70–140)
Potassium: 4 mmol/L (ref 3.5–5.1)
SODIUM: 138 mmol/L (ref 136–145)
Total Bilirubin: 0.3 mg/dL (ref 0.2–1.2)
Total Protein: 6.5 g/dL (ref 6.4–8.3)

## 2017-05-17 LAB — CBC WITH DIFFERENTIAL (CANCER CENTER ONLY)
Basophils Absolute: 0 10*3/uL (ref 0.0–0.1)
Basophils Relative: 1 %
Eosinophils Absolute: 0.1 10*3/uL (ref 0.0–0.5)
Eosinophils Relative: 2 %
HEMATOCRIT: 32 % — AB (ref 34.8–46.6)
Hemoglobin: 10.8 g/dL — ABNORMAL LOW (ref 11.6–15.9)
LYMPHS ABS: 0.8 10*3/uL — AB (ref 0.9–3.3)
Lymphocytes Relative: 27 %
MCH: 30.4 pg (ref 25.1–34.0)
MCHC: 33.6 g/dL (ref 31.5–36.0)
MCV: 90.7 fL (ref 79.5–101.0)
MONO ABS: 0.3 10*3/uL (ref 0.1–0.9)
MONOS PCT: 11 %
NEUTROS ABS: 1.7 10*3/uL (ref 1.5–6.5)
NEUTROS PCT: 59 %
Platelet Count: 167 10*3/uL (ref 145–400)
RBC: 3.53 MIL/uL — ABNORMAL LOW (ref 3.70–5.45)
RDW: 16.9 % — ABNORMAL HIGH (ref 11.2–14.5)
WBC Count: 3 10*3/uL — ABNORMAL LOW (ref 3.9–10.3)

## 2017-05-17 MED ORDER — HEPARIN SOD (PORK) LOCK FLUSH 100 UNIT/ML IV SOLN
500.0000 [IU] | Freq: Once | INTRAVENOUS | Status: AC | PRN
  Administered 2017-05-17: 500 [IU]
  Filled 2017-05-17: qty 5

## 2017-05-17 MED ORDER — SODIUM CHLORIDE 0.9% FLUSH
10.0000 mL | INTRAVENOUS | Status: DC | PRN
  Administered 2017-05-17: 10 mL
  Filled 2017-05-17: qty 10

## 2017-05-23 ENCOUNTER — Inpatient Hospital Stay: Payer: BC Managed Care – PPO

## 2017-05-23 VITALS — BP 133/91 | HR 86 | Temp 98.5°F | Resp 16

## 2017-05-23 DIAGNOSIS — C50311 Malignant neoplasm of lower-inner quadrant of right female breast: Secondary | ICD-10-CM | POA: Diagnosis not present

## 2017-05-23 DIAGNOSIS — Z170 Estrogen receptor positive status [ER+]: Principal | ICD-10-CM

## 2017-05-23 DIAGNOSIS — Z95828 Presence of other vascular implants and grafts: Secondary | ICD-10-CM

## 2017-05-23 LAB — CMP (CANCER CENTER ONLY)
ALK PHOS: 82 U/L (ref 40–150)
ALT: 31 U/L (ref 0–55)
AST: 19 U/L (ref 5–34)
Albumin: 4 g/dL (ref 3.5–5.0)
Anion gap: 9 (ref 3–11)
BILIRUBIN TOTAL: 0.3 mg/dL (ref 0.2–1.2)
BUN: 11 mg/dL (ref 7–26)
CALCIUM: 10 mg/dL (ref 8.4–10.4)
CHLORIDE: 103 mmol/L (ref 98–109)
CO2: 26 mmol/L (ref 22–29)
CREATININE: 0.74 mg/dL (ref 0.60–1.10)
Glucose, Bld: 94 mg/dL (ref 70–140)
Potassium: 4.4 mmol/L (ref 3.5–5.1)
Sodium: 138 mmol/L (ref 136–145)
TOTAL PROTEIN: 7 g/dL (ref 6.4–8.3)

## 2017-05-23 LAB — CBC WITH DIFFERENTIAL (CANCER CENTER ONLY)
BASOS ABS: 0 10*3/uL (ref 0.0–0.1)
Basophils Relative: 0 %
EOS PCT: 0 %
Eosinophils Absolute: 0 10*3/uL (ref 0.0–0.5)
HEMATOCRIT: 34.4 % — AB (ref 34.8–46.6)
Hemoglobin: 11.4 g/dL — ABNORMAL LOW (ref 11.6–15.9)
LYMPHS PCT: 10 %
Lymphs Abs: 1.1 10*3/uL (ref 0.9–3.3)
MCH: 30.9 pg (ref 25.1–34.0)
MCHC: 33.1 g/dL (ref 31.5–36.0)
MCV: 93.2 fL (ref 79.5–101.0)
Monocytes Absolute: 0.8 10*3/uL (ref 0.1–0.9)
Monocytes Relative: 7 %
NEUTROS ABS: 8.9 10*3/uL — AB (ref 1.5–6.5)
Neutrophils Relative %: 83 %
PLATELETS: 111 10*3/uL — AB (ref 145–400)
RBC: 3.69 MIL/uL — AB (ref 3.70–5.45)
RDW: 17.5 % — ABNORMAL HIGH (ref 11.2–14.5)
WBC: 10.9 10*3/uL — AB (ref 3.9–10.3)

## 2017-05-23 MED ORDER — FAMOTIDINE IN NACL 20-0.9 MG/50ML-% IV SOLN
20.0000 mg | Freq: Once | INTRAVENOUS | Status: AC
  Administered 2017-05-23: 20 mg via INTRAVENOUS

## 2017-05-23 MED ORDER — SODIUM CHLORIDE 0.9 % IV SOLN
80.0000 mg/m2 | Freq: Once | INTRAVENOUS | Status: AC
  Administered 2017-05-23: 168 mg via INTRAVENOUS
  Filled 2017-05-23: qty 28

## 2017-05-23 MED ORDER — SODIUM CHLORIDE 0.9 % IV SOLN
20.0000 mg | Freq: Once | INTRAVENOUS | Status: AC
  Administered 2017-05-23: 20 mg via INTRAVENOUS
  Filled 2017-05-23: qty 2

## 2017-05-23 MED ORDER — SODIUM CHLORIDE 0.9% FLUSH
10.0000 mL | INTRAVENOUS | Status: DC | PRN
  Administered 2017-05-23: 10 mL
  Filled 2017-05-23: qty 10

## 2017-05-23 MED ORDER — DIPHENHYDRAMINE HCL 50 MG/ML IJ SOLN
INTRAMUSCULAR | Status: AC
  Filled 2017-05-23: qty 1

## 2017-05-23 MED ORDER — SODIUM CHLORIDE 0.9 % IV SOLN
Freq: Once | INTRAVENOUS | Status: AC
  Administered 2017-05-23: 11:00:00 via INTRAVENOUS

## 2017-05-23 MED ORDER — FAMOTIDINE IN NACL 20-0.9 MG/50ML-% IV SOLN
INTRAVENOUS | Status: AC
Start: 2017-05-23 — End: 2017-05-23
  Filled 2017-05-23: qty 50

## 2017-05-23 MED ORDER — DIPHENHYDRAMINE HCL 50 MG/ML IJ SOLN
25.0000 mg | Freq: Once | INTRAMUSCULAR | Status: AC
  Administered 2017-05-23: 25 mg via INTRAVENOUS

## 2017-05-23 MED ORDER — HEPARIN SOD (PORK) LOCK FLUSH 100 UNIT/ML IV SOLN
500.0000 [IU] | Freq: Once | INTRAVENOUS | Status: AC | PRN
  Administered 2017-05-23: 500 [IU]
  Filled 2017-05-23: qty 5

## 2017-05-23 NOTE — Patient Instructions (Signed)
[facility] Discharge Instructions for Patients Receiving Chemotherapy  Today you received the following chemotherapy agents taxol  To help prevent nausea and vomiting after your treatment, we encourage you to take your nausea medication as directed   If you develop nausea and vomiting that is not controlled by your nausea medication, call the clinic.   BELOW ARE SYMPTOMS THAT SHOULD BE REPORTED IMMEDIATELY:  *FEVER GREATER THAN 100.5 F  *CHILLS WITH OR WITHOUT FEVER  NAUSEA AND VOMITING THAT IS NOT CONTROLLED WITH YOUR NAUSEA MEDICATION  *UNUSUAL SHORTNESS OF BREATH  *UNUSUAL BRUISING OR BLEEDING  TENDERNESS IN MOUTH AND THROAT WITH OR WITHOUT PRESENCE OF ULCERS  *URINARY PROBLEMS  *BOWEL PROBLEMS  UNUSUAL RASH Items with * indicate a potential emergency and should be followed up as soon as possible.  Feel free to call the clinic you have any questions or concerns. The clinic phone number is [phone].  

## 2017-05-24 ENCOUNTER — Telehealth: Payer: Self-pay | Admitting: *Deleted

## 2017-05-24 NOTE — Telephone Encounter (Signed)
-----   Message from Arty Baumgartner, RN sent at 05/23/2017  2:21 PM EDT ----- Regarding: Magrinat/ First time chemo First time taxol, tolerated well.  A/C previously.  Dr. Jana Hakim

## 2017-05-24 NOTE — Telephone Encounter (Signed)
Pt called by this RN - obtained identified VM- message left with request to return call if concerns.

## 2017-05-25 ENCOUNTER — Encounter: Payer: Self-pay | Admitting: Pharmacist

## 2017-05-29 NOTE — Progress Notes (Signed)
[place]  [phone] [phone]) [phone]     ID: Shelby Payne DOB: [birth year]  MR#: [id number]  TJQ#:[id number]  Patient Care Team: Hoyt Koch, MD as PCP - General (Internal Medicine) Magrinat, Virgie Dad, MD as Consulting Physician (Oncology) Eppie Gibson, MD as Attending Physician (Radiation Oncology) Erroll Luna, MD as Consulting Physician (General Surgery) Christene Slates, MD as Physician Assistant (Radiology) OTHER MD:  CHIEF COMPLAINT: Estrogen receptor positive breast cancer  CURRENT TREATMENT: Adjuvant chemotherapy    HISTORY OF CURRENT ILLNESS: From the original intake note:  The patient had bilateral screening mammography at [facility] 12/28/2016.  There was a new mass in the right breast lower inner quadrant and some new grouped calcifications in the right breast same quadrant.  Accordingly the patient was called back for right diagnostic mammography and ultrasonography 01/09/2017.  The breast density was category A.  In the right breast lower inner quadrant there was a new irregular mass.  Again noted was a new group of heterogeneous calcifications in the same quadrant.  Ultrasound confirmed a 1.7 cm irregular mass in the right breast lower inner quadrant.  There were no abnormalities sonographically in the right axilla.  Taken together,  the mass plus calcifications measured up to 4 cm.  Accordingly on 01/11/2017 she underwent biopsy of the right breast mass in question, showing [phone]) invasive ductal carcinoma, grade 2, estrogen receptor 90% positive with strong staining intensity, progesterone receptor negative, with an Mib-1 of 20% and no HER-2 amplification, the signals ratio being 1.26 and the number per cell 2.20  The patient's subsequent history is as detailed below.  INTERVAL HISTORY: Shelby Payne returns today for follow up and treatment of her estrogen receptor positive breast cancer. Today is day 1 cycle 2 of 12 planned  weekly paclitaxel doses. She reports that with her first cycle, she did well overall, however she notes that on 05/26/2017, she was lightheaded with minimal nausea. Following that on 05/27/2017, she cooked dinner and napped a lot the following day.    Importantly she had no problems with drug reactions from the first Taxol dose.  She also reports no peripheral neuropathy.  REVIEW OF SYSTEMS: Shelby Payne denies taste changes. She has had constipation last week and had colace which alleviated her symptoms. She denies unusual headaches, visual changes, nausea, vomiting, or dizziness. There has been no unusual cough, phlegm production, or pleurisy. This been no change in bowel or bladder habits. She denies unexplained fatigue or unexplained weight loss, bleeding, rash, or fever. A detailed review of systems was otherwise stable.   PAST MEDICAL HISTORY: Past Medical History:  Diagnosis Date  . Acute sinusitis, unspecified   . Anxiety   . Cancer ([place]) 12/2016   right breast cancer  . Depression   . Family history of prostate cancer   . Hypertension   . Obesity, unspecified   . Pneumonia, organism unspecified(486)     PAST SURGICAL HISTORY: Past Surgical History:  Procedure Laterality Date  . BREAST LUMPECTOMY WITH RADIOACTIVE SEED AND SENTINEL LYMPH NODE BIOPSY Right 01/25/2017   Procedure: RIGHT BREAST LUMPECTOMY  WITH 2 RADIOACTIVE SEEDS AND SENTINEL LYMPH NODE BIOPSY;  Surgeon: Erroll Luna, MD;  Location: [facility];  Service: General;  Laterality: Right;  . PORTACATH PLACEMENT Right 03/13/2017   Procedure: ULTRASOUND GUIDED INSERTION PORT-A-CATH RIGHT INTERNAL JUGULAR;  Surgeon: Erroll Luna, MD;  Location: [facility] [place];  Service: General;  Laterality: Right;  . WISDOM TOOTH EXTRACTION      FAMILY HISTORY  Family History  Problem Relation Age of Onset  . Hypertension Mother   . Hypothyroidism Mother   . Hyperlipidemia Mother   . Gout Mother   . Breast cancer Mother 76  . Heart  disease Father   . Hypertension Father   . Diabetes Father   . Hypothyroidism Father   . Prostate cancer Father        dx late 70s  . Hypothyroidism Sister   . Cancer Sister        angiosarcoma - mets  . Esophageal cancer Maternal Uncle   . Stroke Paternal Aunt   . Stroke Paternal Uncle   . Heart attack Maternal Grandfather   . ALS Paternal Grandfather   . COPD Neg Hx   . Colon cancer Neg Hx   . Rectal cancer Neg Hx   . Stomach cancer Neg Hx   Her father passed away from a massive stroke at 4. Her mother passed away from complications of breast cancer at 43. She had been diagnosed age 64. The patient's sister had angiosarcoma at 9. There is no otherhistory of breast or ovarian cancer in this famiy. The patient is of Ashkenazi extraction  GYNECOLOGIC HISTORY:  Patient's last menstrual period was 07/12/2011.  Menarche age 89, first live birth age 56, she is GXP2. Menopause 2015, never used OCPs or HR   SOCIAL HISTORY:  She is a Electrical engineer at [employer]. Her husband, [name] was a Curator at [employer] in [place]' law, but is retired now. Her daughter [name] teaches nursing at [employer]. [name] is concerned that on her father's side a cousin has tested positive for a deleterious gene; she will try to bring [name] that information]. Son, [name] passed away. The patient does not have any grandchildren. She attends [employer].     ADVANCED DIRECTIVES:    HEALTH MAINTENANCE: Social History   Tobacco Use  . Smoking status: Never Smoker  . Smokeless tobacco: Never Used  Substance Use Topics  . Alcohol use: Yes    Comment: rare glass of wine  . Drug use: No     Colonoscopy: 2013  PAP:  Bone density:never   Allergies  Allergen Reactions  . Hctz [Hydrochlorothiazide] Other (See Comments)    Hyponatremia  . Bactrim [Sulfamethoxazole-Trimethoprim] Rash    Current Outpatient Medications  Medication Sig Dispense Refill  . b complex vitamins  tablet Take 1 tablet by mouth daily.    . Coenzyme Q10 (CO Q-10 PO) Take 1 capsule by mouth daily.     [name] Kitchen dexamethasone (DECADRON) 4 MG tablet Take 2 tablets by mouth once a day on the day after chemotherapy and then take 2 tablets two times a day for 2 days. Take with food. (Patient taking differently: Take 8 mg by mouth See admin instructions. Take 8 mg by mouth once a day on the day after chemotherapy and then take 8 mg two times a day for 2 days. Take with food.) 30 tablet 1  . lidocaine-prilocaine (EMLA) cream Apply to affected area once 30 g 3  . LORazepam (ATIVAN) 0.5 MG tablet Take 1 tablet (0.5 mg total) by mouth at bedtime as needed (Nausea or vomiting). (Patient not taking: Reported on 04/18/2017) 30 tablet 0  . metoprolol succinate (TOPROL-XL) 100 MG 24 hr tablet Take 1 tablet (100 mg total) daily by mouth. 90 tablet 3  . Omega-3 Fatty Acids (FISH OIL PO) Take 1 capsule by mouth daily.     . ondansetron (ZOFRAN) 8  MG tablet Take 1 tablet (8 mg total) by mouth every 8 (eight) hours as needed for nausea or vomiting. Do not take within 3 days of chemotherapy dose. 20 tablet 0  . prochlorperazine (COMPAZINE) 10 MG tablet Take 1 tablet (10 mg total) by mouth every 6 (six) hours as needed (Nausea or vomiting). 30 tablet 1  . sertraline (ZOLOFT) 100 MG tablet Take 1 tablet (100 mg total) daily by mouth. 90 tablet 3  . Telmisartan-Amlodipine 40-5 MG TABS Take 1 tablet daily by mouth. 90 tablet 3  . triamcinolone cream (KENALOG) 0.1 % Apply 1 application 2 (two) times daily topically. (Patient taking differently: Apply 1 application topically 2 (two) times daily as needed (for rash). ) 30 g 6   No current facility-administered medications for this visit.     OBJECTIVE: Middle-aged white woman in no acute distress  Vitals:   05/30/17 1105  BP: 132/84  Pulse: 88  Resp: 18  Temp: 98.6 F (37 C)  SpO2: 97%     Body mass index is 41.11 kg/m.   Wt Readings from Last 3 Encounters:  05/30/17  239 lb 8 oz (108.6 kg)  05/17/17 234 lb 6.4 oz (106.3 kg)  04/25/17 231 lb 12.8 oz (105.1 kg)  ECOG FS:1 - Symptomatic but completely ambulatory  Sclerae unicteric, pupils round and equal Oropharynx clear and moist No cervical or supraclavicular adenopathy Lungs no rales or rhonchi Heart regular rate and rhythm Abd soft, nontender, positive bowel sounds MSK no focal spinal tenderness, no upper extremity lymphedema Neuro: nonfocal, well oriented, appropriate affect Breasts: Deferred     LAB RESULTS:  CMP     Component Value Date/Time   NA 138 05/23/2017 1003   NA 137 01/18/2017 1238   K 4.4 05/23/2017 1003   K 4.1 01/18/2017 1238   CL 103 05/23/2017 1003   CO2 26 05/23/2017 1003   CO2 22 01/18/2017 1238   GLUCOSE 94 05/23/2017 1003   GLUCOSE 91 01/18/2017 1238   BUN 11 05/23/2017 1003   BUN 19.5 01/18/2017 1238   CREATININE 0.74 05/23/2017 1003   CREATININE 0.7 01/18/2017 1238   CALCIUM 10.0 05/23/2017 1003   CALCIUM 9.8 01/18/2017 1238   PROT 7.0 05/23/2017 1003   PROT 7.9 01/18/2017 1238   ALBUMIN 4.0 05/23/2017 1003   ALBUMIN 4.3 01/18/2017 1238   AST 19 05/23/2017 1003   AST 19 01/18/2017 1238   ALT 31 05/23/2017 1003   ALT 24 01/18/2017 1238   ALKPHOS 82 05/23/2017 1003   ALKPHOS 53 01/18/2017 1238   BILITOT 0.3 05/23/2017 1003   BILITOT 0.39 01/18/2017 1238   GFRNONAA >60 05/23/2017 1003   GFRAA >60 05/23/2017 1003    No results found for: TOTALPROTELP, ALBUMINELP, A1GS, A2GS, BETS, BETA2SER, GAMS, MSPIKE, SPEI  No results found for: KPAFRELGTCHN, LAMBDASER, KAPLAMBRATIO  Lab Results  Component Value Date   WBC 6.8 05/30/2017   NEUTROABS 5.1 05/30/2017   HGB 10.5 (L) 05/30/2017   HCT 30.9 (L) 05/30/2017   MCV 92.0 05/30/2017   PLT 263 05/30/2017    _0 @  No results found for: LABCA2  No components found for: [id number]  No results for input(s): INR in the last 168 hours.  No results found for: LABCA2  No results found for:  LDJ570  No results found for: VXB939  No results found for: QZE092  No results found for: CA2729  No components found for: HGQUANT  No results found for: CEA1 / No results found for: CEA1  No results found for: AFPTUMOR  No results found for: [place]  No results found for: PSA1  Appointment on 05/30/2017  Component Date Value Ref Range Status  . WBC Count 05/30/2017 6.8  3.9 - 10.3 K/uL Final  . RBC 05/30/2017 3.36* 3.70 - 5.45 MIL/uL Final  . Hemoglobin 05/30/2017 10.5* 11.6 - 15.9 g/dL Final  . HCT 05/30/2017 30.9* 34.8 - 46.6 % Final  . MCV 05/30/2017 92.0  79.5 - 101.0 fL Final  . MCH 05/30/2017 31.3  25.1 - 34.0 pg Final  . MCHC 05/30/2017 34.0  31.5 - 36.0 g/dL Final  . RDW 05/30/2017 17.0* 11.2 - 14.5 % Final  . Platelet Count 05/30/2017 263  145 - 400 K/uL Final  . Neutrophils Relative % 05/30/2017 76  % Final  . Neutro Abs 05/30/2017 5.1  1.5 - 6.5 K/uL Final  . Lymphocytes Relative 05/30/2017 15  % Final  . Lymphs Abs 05/30/2017 1.0  0.9 - 3.3 K/uL Final  . Monocytes Relative 05/30/2017 9  % Final  . Monocytes Absolute 05/30/2017 0.6  0.1 - 0.9 K/uL Final  . Eosinophils Relative 05/30/2017 0  % Final  . Eosinophils Absolute 05/30/2017 0.0  0.0 - 0.5 K/uL Final  . Basophils Relative 05/30/2017 0  % Final  . Basophils Absolute 05/30/2017 0.0  0.0 - 0.1 K/uL Final   Performed at [facility], [address] [place], [place] [address]    (this displays the last labs from the last 3 days)  No results found for: TOTALPROTELP, ALBUMINELP, A1GS, A2GS, BETS, BETA2SER, GAMS, MSPIKE, SPEI (this displays SPEP labs)  No results found for: KPAFRELGTCHN, LAMBDASER, KAPLAMBRATIO (kappa/lambda light chains)  No results found for: HGBA, HGBA2QUANT, HGBFQUANT, HGBSQUAN (Hemoglobinopathy evaluation)   No results found for: LDH  No results found for: IRON, TIBC, IRONPCTSAT (Iron and TIBC)  No results found for: FERRITIN  Urinalysis      Component Value Date/Time   COLORURINE Yellow 12/29/2008 0809   APPEARANCEUR CLEAR 12/29/2008 0809   LABSPEC 1.020 12/29/2008 0809   PHURINE 6.0 12/29/2008 0809   GLUCOSEU NEGATIVE 12/29/2008 0809   BILIRUBINUR NEGATIVE 12/29/2008 0809   KETONESUR NEGATIVE 12/29/2008 0809   UROBILINOGEN 0.2 12/29/2008 0809   NITRITE NEGATIVE 12/29/2008 0809   LEUKOCYTESUR NEGATIVE 12/29/2008 0809     STUDIES: No results found.  ELIGIBLE FOR AVAILABLE RESEARCH PROTOCOL: UPBEAT, ASA: Referral placed 02/28/2017  ASSESSMENT: 62 y.o. [place] woman s/p right breast lower inner quadrant biopsy 01/11/2017 for a cT1c cN0, stage IA invasive ductal breast cancer, estrogen receptor positive, progesterone receptor negativem HER-2 not amplified, with an Mib-1 of 20%  (1) genetics testing 03/22/2017 through the Hereditary Gene Panel offered by [facility] found no deleterious mutations in APC, ATM, AXIN2, BARD1, BMPR1A, BRCA1, BRCA2, BRIP1, CDH1, CDK4, CDKN2A (p14ARF), CDKN2A (p16INK4a), CHEK2, CTNNA1, DICER1, EPCAM (Deletion/duplication testing only), GREM1 (promoter region deletion/duplication testing only), KIT, MEN1, MLH1, MSH2, MSH3, MSH6, MUTYH, NBN, NF1, NHTL1, PALB2, PDGFRA, PMS2, POLD1, POLE, PTEN, RAD50, RAD51C, RAD51D, SDHB, SDHC, SDHD, SMAD4, SMARCA4. STK11, TP53, TSC1, TSC2, and VHL.  The following genes were evaluated for sequence changes only: SDHA and HOXB13 c.251G>A variant only.    (2) status post right lumpectomy and sentinel lymph node sampling 01/25/2017 for a pT1c pN0, stage Ia invasive ductal carcinoma, grade 2, with negative margins.  (3) Oncotype DX score of 42 predicts a 9-year risk of recurrence outside the breast of 30% if the patient's only systemic therapy is tamoxifen for 5 years.  It also predicts a significant chemotherapy benefit.  (4) adjuvant chemotherapy will consist of cyclophosphamide and doxorubicin in dose dense fashion x4 starting 03/14/2017, completed 05/09/2017, followed by  weekly paclitaxel x12 started 05/23/2017  (a) cycle 3 doxorubicin /cyclophosphamide delayed 1 week because of poor tolerance  (5) adjuvant radiation to follow therapy  (6) antiestrogens to start at the completion of local treatment  PLAN:  Shelby Payne did remarkably well with her first cycle of Taxol and we are proceeding with cycle 2 today with no modifications except I am dropping the Decadron premeds to 10 mg this cycle and 4 mg with subsequent cycles.  I have encouraged her to be as active as possible now that she is on "lighter" chemotherapy.  We again went over her peripheral neuropathy symptom monitoring.  Luckily she has 0 at baseline.  At this point we will start seeing her on an every other cycle basis until she gets to the last 4 cycles at which point we will start seeing her weekly.  She knows to call for any other issues that may develop before then.  Magrinat, Virgie Dad, MD  05/30/17 11:30 AM Medical Oncology and Hematology [facility] [address] [place], [place] [address] Tel. [phone]    Fax. [phone]  This document serves as a record of services personally performed by Chauncey Cruel, MD. It was created on his behalf by Steva Colder, a trained medical scribe. The creation of this record is based on the scribe's personal observations and the provider's statements to them.   I have reviewed the above documentation for accuracy and completeness, and I agree with the above.

## 2017-05-30 ENCOUNTER — Inpatient Hospital Stay: Payer: BC Managed Care – PPO

## 2017-05-30 ENCOUNTER — Inpatient Hospital Stay (HOSPITAL_BASED_OUTPATIENT_CLINIC_OR_DEPARTMENT_OTHER): Payer: BC Managed Care – PPO | Admitting: Oncology

## 2017-05-30 VITALS — BP 132/84 | HR 88 | Temp 98.6°F | Resp 18 | Ht 64.0 in | Wt 239.5 lb

## 2017-05-30 DIAGNOSIS — I10 Essential (primary) hypertension: Secondary | ICD-10-CM

## 2017-05-30 DIAGNOSIS — C50311 Malignant neoplasm of lower-inner quadrant of right female breast: Secondary | ICD-10-CM | POA: Diagnosis not present

## 2017-05-30 DIAGNOSIS — Z170 Estrogen receptor positive status [ER+]: Secondary | ICD-10-CM | POA: Diagnosis not present

## 2017-05-30 DIAGNOSIS — Z79810 Long term (current) use of selective estrogen receptor modulators (SERMs): Secondary | ICD-10-CM | POA: Diagnosis not present

## 2017-05-30 DIAGNOSIS — Z95828 Presence of other vascular implants and grafts: Secondary | ICD-10-CM

## 2017-05-30 LAB — CBC WITH DIFFERENTIAL (CANCER CENTER ONLY)
BASOS ABS: 0 10*3/uL (ref 0.0–0.1)
BASOS PCT: 0 %
EOS PCT: 0 %
Eosinophils Absolute: 0 10*3/uL (ref 0.0–0.5)
HEMATOCRIT: 30.9 % — AB (ref 34.8–46.6)
Hemoglobin: 10.5 g/dL — ABNORMAL LOW (ref 11.6–15.9)
Lymphocytes Relative: 15 %
Lymphs Abs: 1 10*3/uL (ref 0.9–3.3)
MCH: 31.3 pg (ref 25.1–34.0)
MCHC: 34 g/dL (ref 31.5–36.0)
MCV: 92 fL (ref 79.5–101.0)
MONO ABS: 0.6 10*3/uL (ref 0.1–0.9)
Monocytes Relative: 9 %
Neutro Abs: 5.1 10*3/uL (ref 1.5–6.5)
Neutrophils Relative %: 76 %
Platelet Count: 263 10*3/uL (ref 145–400)
RBC: 3.36 MIL/uL — AB (ref 3.70–5.45)
RDW: 17 % — ABNORMAL HIGH (ref 11.2–14.5)
WBC: 6.8 10*3/uL (ref 3.9–10.3)

## 2017-05-30 LAB — CMP (CANCER CENTER ONLY)
ALBUMIN: 3.7 g/dL (ref 3.5–5.0)
ALT: 50 U/L (ref 0–55)
AST: 24 U/L (ref 5–34)
Alkaline Phosphatase: 54 U/L (ref 40–150)
Anion gap: 9 (ref 3–11)
BILIRUBIN TOTAL: 0.3 mg/dL (ref 0.2–1.2)
BUN: 12 mg/dL (ref 7–26)
CALCIUM: 9.4 mg/dL (ref 8.4–10.4)
CO2: 24 mmol/L (ref 22–29)
Chloride: 106 mmol/L (ref 98–109)
Creatinine: 0.7 mg/dL (ref 0.60–1.10)
GFR, Est AFR Am: >60 mL/min (ref >60)
GLUCOSE: 94 mg/dL (ref 70–140)
Potassium: 4.1 mmol/L (ref 3.5–5.1)
Sodium: 139 mmol/L (ref 136–145)
TOTAL PROTEIN: 6.6 g/dL (ref 6.4–8.3)

## 2017-05-30 MED ORDER — DIPHENHYDRAMINE HCL 50 MG/ML IJ SOLN
INTRAMUSCULAR | Status: AC
  Filled 2017-05-30: qty 1

## 2017-05-30 MED ORDER — SODIUM CHLORIDE 0.9% FLUSH
10.0000 mL | INTRAVENOUS | Status: DC | PRN
  Administered 2017-05-30: 10 mL
  Filled 2017-05-30: qty 10

## 2017-05-30 MED ORDER — SODIUM CHLORIDE 0.9 % IV SOLN
80.0000 mg/m2 | Freq: Once | INTRAVENOUS | Status: AC
  Administered 2017-05-30: 168 mg via INTRAVENOUS
  Filled 2017-05-30: qty 28

## 2017-05-30 MED ORDER — DIPHENHYDRAMINE HCL 50 MG/ML IJ SOLN
25.0000 mg | Freq: Once | INTRAMUSCULAR | Status: AC
  Administered 2017-05-30: 25 mg via INTRAVENOUS

## 2017-05-30 MED ORDER — FAMOTIDINE IN NACL 20-0.9 MG/50ML-% IV SOLN
INTRAVENOUS | Status: AC
  Filled 2017-05-30: qty 50

## 2017-05-30 MED ORDER — SODIUM CHLORIDE 0.9 % IV SOLN
Freq: Once | INTRAVENOUS | Status: AC
Start: 2017-05-30 — End: 2017-05-30
  Administered 2017-05-30: 12:00:00 via INTRAVENOUS

## 2017-05-30 MED ORDER — FAMOTIDINE IN NACL 20-0.9 MG/50ML-% IV SOLN
20.0000 mg | Freq: Once | INTRAVENOUS | Status: AC
  Administered 2017-05-30: 20 mg via INTRAVENOUS

## 2017-05-30 MED ORDER — DEXAMETHASONE SODIUM PHOSPHATE 10 MG/ML IJ SOLN
10.0000 mg | Freq: Once | INTRAMUSCULAR | Status: AC
  Administered 2017-05-30: 10 mg via INTRAVENOUS

## 2017-05-30 MED ORDER — DEXAMETHASONE SODIUM PHOSPHATE 10 MG/ML IJ SOLN
INTRAMUSCULAR | Status: AC
  Filled 2017-05-30: qty 1

## 2017-05-30 MED ORDER — HEPARIN SOD (PORK) LOCK FLUSH 100 UNIT/ML IV SOLN
500.0000 [IU] | Freq: Once | INTRAVENOUS | Status: AC | PRN
  Administered 2017-05-30: 500 [IU]
  Filled 2017-05-30: qty 5

## 2017-05-30 NOTE — Addendum Note (Signed)
Addended by: Chauncey Cruel on: 05/30/2017 12:07 PM   Modules accepted: Orders

## 2017-05-30 NOTE — Patient Instructions (Signed)

## 2017-05-30 NOTE — Patient Instructions (Addendum)
[facility] Discharge Instructions for Patients Receiving Chemotherapy  Today you received the following chemotherapy agents:  Taxol.  To help prevent nausea and vomiting after your treatment, we encourage you to take your nausea medication as directed.   If you develop nausea and vomiting that is not controlled by your nausea medication, call the clinic.   BELOW ARE SYMPTOMS THAT SHOULD BE REPORTED IMMEDIATELY:  *FEVER GREATER THAN 100.5 F  *CHILLS WITH OR WITHOUT FEVER  NAUSEA AND VOMITING THAT IS NOT CONTROLLED WITH YOUR NAUSEA MEDICATION  *UNUSUAL SHORTNESS OF BREATH  *UNUSUAL BRUISING OR BLEEDING  TENDERNESS IN MOUTH AND THROAT WITH OR WITHOUT PRESENCE OF ULCERS  *URINARY PROBLEMS  *BOWEL PROBLEMS  UNUSUAL RASH Items with * indicate a potential emergency and should be followed up as soon as possible.  Feel free to call the clinic should you have any questions or concerns. The clinic phone number is [phone].  Please show the CHEMO ALERT CARD at check-in to the Emergency Department and triage nurse.   

## 2017-05-31 ENCOUNTER — Telehealth: Payer: Self-pay | Admitting: Oncology

## 2017-05-31 NOTE — Telephone Encounter (Signed)
Per 4/23 no los 

## 2017-06-06 ENCOUNTER — Inpatient Hospital Stay: Payer: BC Managed Care – PPO

## 2017-06-06 DIAGNOSIS — C50311 Malignant neoplasm of lower-inner quadrant of right female breast: Secondary | ICD-10-CM | POA: Diagnosis not present

## 2017-06-06 DIAGNOSIS — Z95828 Presence of other vascular implants and grafts: Secondary | ICD-10-CM

## 2017-06-06 DIAGNOSIS — Z170 Estrogen receptor positive status [ER+]: Principal | ICD-10-CM

## 2017-06-06 LAB — CBC WITH DIFFERENTIAL (CANCER CENTER ONLY)
BASOS ABS: 0.1 10*3/uL (ref 0.0–0.1)
BASOS PCT: 1 %
EOS ABS: 0.1 10*3/uL (ref 0.0–0.5)
Eosinophils Relative: 1 %
HCT: 31.5 % — ABNORMAL LOW (ref 34.8–46.6)
Hemoglobin: 10.8 g/dL — ABNORMAL LOW (ref 11.6–15.9)
Lymphocytes Relative: 17 %
Lymphs Abs: 1.1 10*3/uL (ref 0.9–3.3)
MCH: 31.8 pg (ref 25.1–34.0)
MCHC: 34.2 g/dL (ref 31.5–36.0)
MCV: 93.1 fL (ref 79.5–101.0)
MONO ABS: 0.6 10*3/uL (ref 0.1–0.9)
MONOS PCT: 9 %
Neutro Abs: 4.6 10*3/uL (ref 1.5–6.5)
Neutrophils Relative %: 72 %
Platelet Count: 273 10*3/uL (ref 145–400)
RBC: 3.39 MIL/uL — ABNORMAL LOW (ref 3.70–5.45)
RDW: 18.5 % — AB (ref 11.2–14.5)
WBC Count: 6.4 10*3/uL (ref 3.9–10.3)

## 2017-06-06 LAB — CMP (CANCER CENTER ONLY)
ALK PHOS: 46 U/L (ref 40–150)
ALT: 64 U/L — AB (ref 0–55)
AST: 33 U/L (ref 5–34)
Albumin: 3.8 g/dL (ref 3.5–5.0)
Anion gap: 7 (ref 3–11)
BILIRUBIN TOTAL: 0.3 mg/dL (ref 0.2–1.2)
BUN: 15 mg/dL (ref 7–26)
CALCIUM: 9.7 mg/dL (ref 8.4–10.4)
CO2: 27 mmol/L (ref 22–29)
CREATININE: 0.7 mg/dL (ref 0.60–1.10)
Chloride: 103 mmol/L (ref 98–109)
GFR, Est AFR Am: >60 mL/min (ref >60)
GFR, Estimated: >60 mL/min (ref >60)
GLUCOSE: 92 mg/dL (ref 70–140)
Potassium: 4.2 mmol/L (ref 3.5–5.1)
SODIUM: 137 mmol/L (ref 136–145)
TOTAL PROTEIN: 6.6 g/dL (ref 6.4–8.3)

## 2017-06-06 MED ORDER — DIPHENHYDRAMINE HCL 50 MG/ML IJ SOLN
25.0000 mg | Freq: Once | INTRAMUSCULAR | Status: AC
  Administered 2017-06-06: 25 mg via INTRAVENOUS

## 2017-06-06 MED ORDER — FAMOTIDINE IN NACL 20-0.9 MG/50ML-% IV SOLN
20.0000 mg | Freq: Once | INTRAVENOUS | Status: AC
  Administered 2017-06-06: 20 mg via INTRAVENOUS

## 2017-06-06 MED ORDER — SODIUM CHLORIDE 0.9 % IV SOLN
Freq: Once | INTRAVENOUS | Status: AC
  Administered 2017-06-06: 13:00:00 via INTRAVENOUS

## 2017-06-06 MED ORDER — DIPHENHYDRAMINE HCL 50 MG/ML IJ SOLN
INTRAMUSCULAR | Status: AC
  Filled 2017-06-06: qty 1

## 2017-06-06 MED ORDER — FAMOTIDINE IN NACL 20-0.9 MG/50ML-% IV SOLN
INTRAVENOUS | Status: AC
  Filled 2017-06-06: qty 50

## 2017-06-06 MED ORDER — HEPARIN SOD (PORK) LOCK FLUSH 100 UNIT/ML IV SOLN
500.0000 [IU] | Freq: Once | INTRAVENOUS | Status: DC | PRN
  Filled 2017-06-06: qty 5

## 2017-06-06 MED ORDER — SODIUM CHLORIDE 0.9 % IV SOLN
80.0000 mg/m2 | Freq: Once | INTRAVENOUS | Status: AC
  Administered 2017-06-06: 168 mg via INTRAVENOUS
  Filled 2017-06-06: qty 28

## 2017-06-06 MED ORDER — SODIUM CHLORIDE 0.9% FLUSH
10.0000 mL | INTRAVENOUS | Status: DC | PRN
  Administered 2017-06-06: 10 mL
  Filled 2017-06-06: qty 10

## 2017-06-06 MED ORDER — DEXAMETHASONE SODIUM PHOSPHATE 10 MG/ML IJ SOLN
4.0000 mg | Freq: Once | INTRAMUSCULAR | Status: AC
  Administered 2017-06-06: 4 mg via INTRAVENOUS

## 2017-06-06 MED ORDER — DEXAMETHASONE SODIUM PHOSPHATE 10 MG/ML IJ SOLN
INTRAMUSCULAR | Status: AC
Start: 2017-06-06 — End: 2017-06-06
  Filled 2017-06-06: qty 1

## 2017-06-06 NOTE — Patient Instructions (Signed)
[facility] Discharge Instructions for Patients Receiving Chemotherapy  Today you received the following chemotherapy agents:  Taxol.  To help prevent nausea and vomiting after your treatment, we encourage you to take your nausea medication as directed.   If you develop nausea and vomiting that is not controlled by your nausea medication, call the clinic.   BELOW ARE SYMPTOMS THAT SHOULD BE REPORTED IMMEDIATELY:  *FEVER GREATER THAN 100.5 F  *CHILLS WITH OR WITHOUT FEVER  NAUSEA AND VOMITING THAT IS NOT CONTROLLED WITH YOUR NAUSEA MEDICATION  *UNUSUAL SHORTNESS OF BREATH  *UNUSUAL BRUISING OR BLEEDING  TENDERNESS IN MOUTH AND THROAT WITH OR WITHOUT PRESENCE OF ULCERS  *URINARY PROBLEMS  *BOWEL PROBLEMS  UNUSUAL RASH Items with * indicate a potential emergency and should be followed up as soon as possible.  Feel free to call the clinic should you have any questions or concerns. The clinic phone number is [phone].  Please show the CHEMO ALERT CARD at check-in to the Emergency Department and triage nurse.   

## 2017-06-07 ENCOUNTER — Encounter: Payer: Self-pay | Admitting: *Deleted

## 2017-06-13 ENCOUNTER — Telehealth: Payer: Self-pay

## 2017-06-13 ENCOUNTER — Inpatient Hospital Stay: Payer: BC Managed Care – PPO

## 2017-06-13 ENCOUNTER — Inpatient Hospital Stay: Payer: BC Managed Care – PPO | Attending: Oncology

## 2017-06-13 ENCOUNTER — Inpatient Hospital Stay (HOSPITAL_BASED_OUTPATIENT_CLINIC_OR_DEPARTMENT_OTHER): Payer: BC Managed Care – PPO | Admitting: Adult Health

## 2017-06-13 ENCOUNTER — Encounter: Payer: Self-pay | Admitting: Adult Health

## 2017-06-13 ENCOUNTER — Other Ambulatory Visit: Payer: Self-pay | Admitting: Oncology

## 2017-06-13 ENCOUNTER — Telehealth: Payer: Self-pay | Admitting: Adult Health

## 2017-06-13 VITALS — BP 130/78 | HR 94 | Temp 98.6°F | Resp 18 | Ht 64.0 in | Wt 237.7 lb

## 2017-06-13 DIAGNOSIS — Z170 Estrogen receptor positive status [ER+]: Principal | ICD-10-CM

## 2017-06-13 DIAGNOSIS — C50311 Malignant neoplasm of lower-inner quadrant of right female breast: Secondary | ICD-10-CM

## 2017-06-13 DIAGNOSIS — Z5111 Encounter for antineoplastic chemotherapy: Secondary | ICD-10-CM | POA: Insufficient documentation

## 2017-06-13 DIAGNOSIS — Z95828 Presence of other vascular implants and grafts: Secondary | ICD-10-CM

## 2017-06-13 LAB — CBC WITH DIFFERENTIAL (CANCER CENTER ONLY)
BASOS PCT: 1 %
Basophils Absolute: 0 10*3/uL (ref 0.0–0.1)
Eosinophils Absolute: 0.1 10*3/uL (ref 0.0–0.5)
Eosinophils Relative: 1 %
HCT: 33.8 % — ABNORMAL LOW (ref 34.8–46.6)
Hemoglobin: 11.3 g/dL — ABNORMAL LOW (ref 11.6–15.9)
Lymphocytes Relative: 19 %
Lymphs Abs: 1.1 10*3/uL (ref 0.9–3.3)
MCH: 31.7 pg (ref 25.1–34.0)
MCHC: 33.4 g/dL (ref 31.5–36.0)
MCV: 94.7 fL (ref 79.5–101.0)
MONO ABS: 0.3 10*3/uL (ref 0.1–0.9)
Monocytes Relative: 5 %
NEUTROS ABS: 4.6 10*3/uL (ref 1.5–6.5)
Neutrophils Relative %: 74 %
Platelet Count: 246 10*3/uL (ref 145–400)
RBC: 3.57 MIL/uL — ABNORMAL LOW (ref 3.70–5.45)
RDW: 16.5 % — AB (ref 11.2–14.5)
WBC: 6.1 10*3/uL (ref 3.9–10.3)

## 2017-06-13 LAB — CMP (CANCER CENTER ONLY)
ALBUMIN: 4 g/dL (ref 3.5–5.0)
ALT: 80 U/L — ABNORMAL HIGH (ref 0–55)
ANION GAP: 10 (ref 3–11)
AST: 48 U/L — ABNORMAL HIGH (ref 5–34)
Alkaline Phosphatase: 56 U/L (ref 40–150)
BILIRUBIN TOTAL: 0.4 mg/dL (ref 0.2–1.2)
BUN: 14 mg/dL (ref 7–26)
CO2: 23 mmol/L (ref 22–29)
Calcium: 9.9 mg/dL (ref 8.4–10.4)
Chloride: 105 mmol/L (ref 98–109)
Creatinine: 0.8 mg/dL (ref 0.60–1.10)
Glucose, Bld: 110 mg/dL (ref 70–140)
POTASSIUM: 3.8 mmol/L (ref 3.5–5.1)
Sodium: 138 mmol/L (ref 136–145)
TOTAL PROTEIN: 7.1 g/dL (ref 6.4–8.3)

## 2017-06-13 MED ORDER — DEXAMETHASONE SODIUM PHOSPHATE 10 MG/ML IJ SOLN
4.0000 mg | Freq: Once | INTRAMUSCULAR | Status: AC
  Administered 2017-06-13: 4 mg via INTRAVENOUS

## 2017-06-13 MED ORDER — SODIUM CHLORIDE 0.9 % IV SOLN
Freq: Once | INTRAVENOUS | Status: AC
  Administered 2017-06-13: 12:00:00 via INTRAVENOUS

## 2017-06-13 MED ORDER — HEPARIN SOD (PORK) LOCK FLUSH 100 UNIT/ML IV SOLN
500.0000 [IU] | Freq: Once | INTRAVENOUS | Status: AC | PRN
  Administered 2017-06-13: 500 [IU]
  Filled 2017-06-13: qty 5

## 2017-06-13 MED ORDER — SODIUM CHLORIDE 0.9% FLUSH
10.0000 mL | INTRAVENOUS | Status: DC | PRN
  Administered 2017-06-13: 10 mL
  Filled 2017-06-13: qty 10

## 2017-06-13 MED ORDER — DEXAMETHASONE SODIUM PHOSPHATE 10 MG/ML IJ SOLN
INTRAMUSCULAR | Status: AC
  Filled 2017-06-13: qty 1

## 2017-06-13 MED ORDER — DIPHENHYDRAMINE HCL 50 MG/ML IJ SOLN
INTRAMUSCULAR | Status: AC
  Filled 2017-06-13: qty 1

## 2017-06-13 MED ORDER — SODIUM CHLORIDE 0.9 % IV SOLN
65.0000 mg/m2 | Freq: Once | INTRAVENOUS | Status: AC
  Administered 2017-06-13: 138 mg via INTRAVENOUS
  Filled 2017-06-13: qty 23

## 2017-06-13 MED ORDER — FAMOTIDINE IN NACL 20-0.9 MG/50ML-% IV SOLN
20.0000 mg | Freq: Once | INTRAVENOUS | Status: AC
  Administered 2017-06-13: 20 mg via INTRAVENOUS

## 2017-06-13 MED ORDER — DIPHENHYDRAMINE HCL 50 MG/ML IJ SOLN
25.0000 mg | Freq: Once | INTRAMUSCULAR | Status: AC
  Administered 2017-06-13: 25 mg via INTRAVENOUS

## 2017-06-13 MED ORDER — FAMOTIDINE IN NACL 20-0.9 MG/50ML-% IV SOLN
INTRAVENOUS | Status: AC
  Filled 2017-06-13: qty 50

## 2017-06-13 NOTE — Telephone Encounter (Signed)
Gave patient AVs and calendar of upcoming may through July appointments.

## 2017-06-13 NOTE — Telephone Encounter (Signed)
Per Maudie Mercury RN , pt is on day 1 of cycle 3 of Taxol and her ALT today is 80 and last week was 64, ok to treat?  Per Dr Jana Hakim, ok to treat but he is lowering the dose. Called Kim RN back with this information and Dr Jana Hakim will put in further orders.

## 2017-06-13 NOTE — Patient Instructions (Signed)
[facility] Discharge Instructions for Patients Receiving Chemotherapy  Today you received the following chemotherapy agents:  Taxol.  To help prevent nausea and vomiting after your treatment, we encourage you to take your nausea medication as directed.   If you develop nausea and vomiting that is not controlled by your nausea medication, call the clinic.   BELOW ARE SYMPTOMS THAT SHOULD BE REPORTED IMMEDIATELY:  *FEVER GREATER THAN 100.5 F  *CHILLS WITH OR WITHOUT FEVER  NAUSEA AND VOMITING THAT IS NOT CONTROLLED WITH YOUR NAUSEA MEDICATION  *UNUSUAL SHORTNESS OF BREATH  *UNUSUAL BRUISING OR BLEEDING  TENDERNESS IN MOUTH AND THROAT WITH OR WITHOUT PRESENCE OF ULCERS  *URINARY PROBLEMS  *BOWEL PROBLEMS  UNUSUAL RASH Items with * indicate a potential emergency and should be followed up as soon as possible.  Feel free to call the clinic should you have any questions or concerns. The clinic phone number is [phone].  Please show the CHEMO ALERT CARD at check-in to the Emergency Department and triage nurse.   

## 2017-06-13 NOTE — Progress Notes (Signed)
Per [employer] desk RN for Dr Jana Hakim of to tx with ALT 80 today, however Taxol dose to be reduced by Dr Jana Hakim for today.

## 2017-06-13 NOTE — Progress Notes (Unsigned)
Li's ALT has risen some.  I am going to drop her Taxol dose.

## 2017-06-13 NOTE — Progress Notes (Signed)
[place]  Telephone:([phone]) [phone] Fax:(336) [phone]     ID: Shelby Payne DOB: [birth year]  MR#: [id number]  CNO#:[id number]  Patient Care Team: Hoyt Koch, MD as PCP - General (Internal Medicine) Magrinat, Virgie Dad, MD as Consulting Physician (Oncology) Eppie Gibson, MD as Attending Physician (Radiation Oncology) Erroll Luna, MD as Consulting Physician (General Surgery) Christene Slates, MD as Physician Assistant (Radiology) OTHER MD:  CHIEF COMPLAINT: Estrogen receptor positive breast cancer  CURRENT TREATMENT: Adjuvant chemotherapy    HISTORY OF CURRENT ILLNESS: From the original intake note:  The patient had bilateral screening mammography at [facility] 12/28/2016.  There was a new mass in the right breast lower inner quadrant and some new grouped calcifications in the right breast same quadrant.  Accordingly the patient was called back for right diagnostic mammography and ultrasonography 01/09/2017.  The breast density was category A.  In the right breast lower inner quadrant there was a new irregular mass.  Again noted was a new group of heterogeneous calcifications in the same quadrant.  Ultrasound confirmed a 1.7 cm irregular mass in the right breast lower inner quadrant.  There were no abnormalities sonographically in the right axilla.  Taken together,  the mass plus calcifications measured up to 4 cm.  Accordingly on 01/11/2017 she underwent biopsy of the right breast mass in question, showing [phone]) invasive ductal carcinoma, grade 2, estrogen receptor 90% positive with strong staining intensity, progesterone receptor negative, with an Mib-1 of 20% and no HER-2 amplification, the signals ratio being 1.26 and the number per cell 2.20  The patient's subsequent history is as detailed below.  INTERVAL HISTORY: Shelby Payne returns today for follow up and treatment of her estrogen receptor positive breast cancer. Today is day 1 cycle 4 of 12 planned  weekly paclitaxel doses.   She is tolerating it well.     REVIEW OF SYSTEMS: Shelby Payne is doing very well today. She is tolerating treatment with Paclitaxel.  She doesn't notice any issues with taking it.  She has had a mild swelling in her ankles, but notes she was recently taken off of HCTZ due to her electrolytes.  She denies peripheral neuropathy.  Otherwise, a detailed ROS was conducted and was non contributory.    PAST MEDICAL HISTORY: Past Medical History:  Diagnosis Date  . Acute sinusitis, unspecified   . Anxiety   . Cancer ([place]) 12/2016   right breast cancer  . Depression   . Family history of prostate cancer   . Hypertension   . Obesity, unspecified   . Pneumonia, organism unspecified(486)     PAST SURGICAL HISTORY: Past Surgical History:  Procedure Laterality Date  . BREAST LUMPECTOMY WITH RADIOACTIVE SEED AND SENTINEL LYMPH NODE BIOPSY Right 01/25/2017   Procedure: RIGHT BREAST LUMPECTOMY  WITH 2 RADIOACTIVE SEEDS AND SENTINEL LYMPH NODE BIOPSY;  Surgeon: Erroll Luna, MD;  Location: [place];  Service: General;  Laterality: Right;  . PORTACATH PLACEMENT Right 03/13/2017   Procedure: ULTRASOUND GUIDED INSERTION PORT-A-CATH RIGHT INTERNAL JUGULAR;  Surgeon: Erroll Luna, MD;  Location: [place];  Service: General;  Laterality: Right;  . WISDOM TOOTH EXTRACTION      FAMILY HISTORY Family History  Problem Relation Age of Onset  . Hypertension Mother   . Hypothyroidism Mother   . Hyperlipidemia Mother   . Gout Mother   . Breast cancer Mother 31  . Heart disease Father   . Hypertension Father   . Diabetes Father   . Hypothyroidism Father   .  Prostate cancer Father        dx late 53s  . Hypothyroidism Sister   . Cancer Sister        angiosarcoma - mets  . Esophageal cancer Maternal Uncle   . Stroke Paternal Aunt   . Stroke Paternal Uncle   . Heart attack Maternal Grandfather   . ALS Paternal Grandfather   . COPD Neg Hx   . Colon cancer Neg Hx     . Rectal cancer Neg Hx   . Stomach cancer Neg Hx   Her father passed away from a massive stroke at 10. Her mother passed away from complications of breast cancer at 54. She had been diagnosed age 24. The patient's sister had angiosarcoma at 68. There is no otherhistory of breast or ovarian cancer in this famiy. The patient is of Ashkenazi extraction  GYNECOLOGIC HISTORY:  Patient's last menstrual period was 07/12/2011.  Menarche age 84, first live birth age 69, she is GXP2. Menopause 2015, never used OCPs or HR   SOCIAL HISTORY:  She is a Electrical engineer at [employer]. Her husband, [name] was a Curator at [employer] in [place], but is retired now. Her daughter [name] teaches nursing at [employer]. [name] is concerned that on her father's side a cousin has tested positive for a deleterious gene; she will try to bring [name] that information]. Son, [name] passed away. The patient does not have any grandchildren. She attends [employer].     ADVANCED DIRECTIVES:    HEALTH MAINTENANCE: Social History   Tobacco Use  . Smoking status: Never Smoker  . Smokeless tobacco: Never Used  Substance Use Topics  . Alcohol use: Yes    Comment: rare glass of wine  . Drug use: No     Colonoscopy: 2013  PAP:  Bone density:never   Allergies  Allergen Reactions  . Hctz [Hydrochlorothiazide] Other (See Comments)    Hyponatremia  . Bactrim [Sulfamethoxazole-Trimethoprim] Rash    Current Outpatient Medications  Medication Sig Dispense Refill  . b complex vitamins tablet Take 1 tablet by mouth daily.    . Coenzyme Q10 (CO Q-10 PO) Take 1 capsule by mouth daily.     [name] Kitchen dexamethasone (DECADRON) 4 MG tablet Take 2 tablets by mouth once a day on the day after chemotherapy and then take 2 tablets two times a day for 2 days. Take with food. (Patient taking differently: Take 8 mg by mouth See admin instructions. Take 8 mg by mouth once a day on the day after chemotherapy and  then take 8 mg two times a day for 2 days. Take with food.) 30 tablet 1  . lidocaine-prilocaine (EMLA) cream Apply to affected area once 30 g 3  . LORazepam (ATIVAN) 0.5 MG tablet Take 1 tablet (0.5 mg total) by mouth at bedtime as needed (Nausea or vomiting). (Patient not taking: Reported on 04/18/2017) 30 tablet 0  . metoprolol succinate (TOPROL-XL) 100 MG 24 hr tablet Take 1 tablet (100 mg total) daily by mouth. 90 tablet 3  . Omega-3 Fatty Acids (FISH OIL PO) Take 1 capsule by mouth daily.     . ondansetron (ZOFRAN) 8 MG tablet Take 1 tablet (8 mg total) by mouth every 8 (eight) hours as needed for nausea or vomiting. Do not take within 3 days of chemotherapy dose. 20 tablet 0  . prochlorperazine (COMPAZINE) 10 MG tablet Take 1 tablet (10 mg total) by mouth every 6 (six) hours as needed (Nausea or vomiting).  30 tablet 1  . sertraline (ZOLOFT) 100 MG tablet Take 1 tablet (100 mg total) daily by mouth. 90 tablet 3  . Telmisartan-Amlodipine 40-5 MG TABS Take 1 tablet daily by mouth. 90 tablet 3  . triamcinolone cream (KENALOG) 0.1 % Apply 1 application 2 (two) times daily topically. (Patient taking differently: Apply 1 application topically 2 (two) times daily as needed (for rash). ) 30 g 6   No current facility-administered medications for this visit.    Facility-Administered Medications Ordered in Other Visits  Medication Dose Route Frequency Provider Last Rate Last Dose  . sodium chloride flush (NS) 0.9 % injection 10 mL  10 mL Intracatheter PRN Magrinat, Virgie Dad, MD   10 mL at 06/13/17 1435    OBJECTIVE:  Vitals:   06/13/17 1059  BP: 130/78  Pulse: 94  Resp: 18  Temp: 98.6 F (37 C)  SpO2: 97%     Body mass index is 40.8 kg/m.   Wt Readings from Last 3 Encounters:  06/13/17 237 lb 11.2 oz (107.8 kg)  05/30/17 239 lb 8 oz (108.6 kg)  05/17/17 234 lb 6.4 oz (106.3 kg)  ECOG FS:1 - Symptomatic but completely ambulatory GENERAL: Patient is a well appearing female in no acute  distress HEENT:  Sclerae anicteric.  Oropharynx clear and moist. No ulcerations or evidence of oropharyngeal candidiasis. Neck is supple.  NODES:  No cervical, supraclavicular, or axillary lymphadenopathy palpated.  BREAST EXAM:  Deferred. LUNGS:  Clear to auscultation bilaterally.  No wheezes or rhonchi. HEART:  Regular rate and rhythm. No murmur appreciated. ABDOMEN:  Soft, nontender.  Positive, normoactive bowel sounds. No organomegaly palpated. MSK:  No focal spinal tenderness to palpation. Full range of motion bilaterally in the upper extremities. EXTREMITIES:  No peripheral edema.   SKIN:  Clear with no obvious rashes or skin changes. No nail dyscrasia. NEURO:  Nonfocal. Well oriented.  Appropriate affect.      LAB RESULTS:  CMP     Component Value Date/Time   NA 138 06/13/2017 1019   NA 137 01/18/2017 1238   K 3.8 06/13/2017 1019   K 4.1 01/18/2017 1238   CL 105 06/13/2017 1019   CO2 23 06/13/2017 1019   CO2 22 01/18/2017 1238   GLUCOSE 110 06/13/2017 1019   GLUCOSE 91 01/18/2017 1238   BUN 14 06/13/2017 1019   BUN 19.5 01/18/2017 1238   CREATININE 0.80 06/13/2017 1019   CREATININE 0.7 01/18/2017 1238   CALCIUM 9.9 06/13/2017 1019   CALCIUM 9.8 01/18/2017 1238   PROT 7.1 06/13/2017 1019   PROT 7.9 01/18/2017 1238   ALBUMIN 4.0 06/13/2017 1019   ALBUMIN 4.3 01/18/2017 1238   AST 48 (H) 06/13/2017 1019   AST 19 01/18/2017 1238   ALT 80 (H) 06/13/2017 1019   ALT 24 01/18/2017 1238   ALKPHOS 56 06/13/2017 1019   ALKPHOS 53 01/18/2017 1238   BILITOT 0.4 06/13/2017 1019   BILITOT 0.39 01/18/2017 1238   GFRNONAA >60 06/13/2017 1019   GFRAA >60 06/13/2017 1019    No results found for: TOTALPROTELP, ALBUMINELP, A1GS, A2GS, BETS, BETA2SER, GAMS, MSPIKE, SPEI  No results found for: KPAFRELGTCHN, LAMBDASER, KAPLAMBRATIO  Lab Results  Component Value Date   WBC 6.1 06/13/2017   NEUTROABS 4.6 06/13/2017   HGB 11.3 (L) 06/13/2017   HCT 33.8 (L) 06/13/2017   MCV  94.7 06/13/2017   PLT 246 06/13/2017    _0 @  No results found for: LABCA2  No components found for: TIRWER154  No results for input(s): INR in the last 168 hours.  No results found for: LABCA2  No results found for: LNL892  No results found for: JJH417  No results found for: EYC144  No results found for: CA2729  No components found for: HGQUANT  No results found for: CEA1 / No results found for: CEA1   No results found for: AFPTUMOR  No results found for: CHROMOGRNA  No results found for: PSA1  Appointment on 06/13/2017  Component Date Value Ref Range Status  . WBC Count 06/13/2017 6.1  3.9 - 10.3 K/uL Final  . RBC 06/13/2017 3.57* 3.70 - 5.45 MIL/uL Final  . Hemoglobin 06/13/2017 11.3* 11.6 - 15.9 g/dL Final  . HCT 06/13/2017 33.8* 34.8 - 46.6 % Final  . MCV 06/13/2017 94.7  79.5 - 101.0 fL Final  . MCH 06/13/2017 31.7  25.1 - 34.0 pg Final  . MCHC 06/13/2017 33.4  31.5 - 36.0 g/dL Final  . RDW 06/13/2017 16.5* 11.2 - 14.5 % Final  . Platelet Count 06/13/2017 246  145 - 400 K/uL Final  . Neutrophils Relative % 06/13/2017 74  % Final  . Neutro Abs 06/13/2017 4.6  1.5 - 6.5 K/uL Final  . Lymphocytes Relative 06/13/2017 19  % Final  . Lymphs Abs 06/13/2017 1.1  0.9 - 3.3 K/uL Final  . Monocytes Relative 06/13/2017 5  % Final  . Monocytes Absolute 06/13/2017 0.3  0.1 - 0.9 K/uL Final  . Eosinophils Relative 06/13/2017 1  % Final  . Eosinophils Absolute 06/13/2017 0.1  0.0 - 0.5 K/uL Final  . Basophils Relative 06/13/2017 1  % Final  . Basophils Absolute 06/13/2017 0.0  0.0 - 0.1 K/uL Final   Performed at [facility], [place] [address], [place], [place] [address]  . Sodium 06/13/2017 138  136 - 145 mmol/L Final  . Potassium 06/13/2017 3.8  3.5 - 5.1 mmol/L Final  . Chloride 06/13/2017 105  98 - 109 mmol/L Final  . CO2 06/13/2017 23  22 - 29 mmol/L Final  . Glucose, Bld 06/13/2017 110  70 - 140 mg/dL Final  . BUN 06/13/2017 14   7 - 26 mg/dL Final  . Creatinine 06/13/2017 0.80  0.60 - 1.10 mg/dL Final  . Calcium 06/13/2017 9.9  8.4 - 10.4 mg/dL Final  . Total Protein 06/13/2017 7.1  6.4 - 8.3 g/dL Final  . Albumin 06/13/2017 4.0  3.5 - 5.0 g/dL Final  . AST 06/13/2017 48* 5 - 34 U/L Final  . ALT 06/13/2017 80* 0 - 55 U/L Final  . Alkaline Phosphatase 06/13/2017 56  40 - 150 U/L Final  . Total Bilirubin 06/13/2017 0.4  0.2 - 1.2 mg/dL Final  . GFR, Est Non Af Am 06/13/2017 >60  >60 mL/min Final  . GFR, Est AFR Am 06/13/2017 >60  >60 mL/min Final   Comment: (NOTE) The eGFR has been calculated using the CKD EPI equation. This calculation has not been validated in all clinical situations. eGFR's persistently <60 mL/min signify possible Chronic Kidney Disease.   Georgiann Hahn gap 06/13/2017 10  3 - 11 Final   Performed at [facility], [place] [address] [place], [place] [address]    (this displays the last labs from the last 3 days)  No results found for: TOTALPROTELP, ALBUMINELP, A1GS, A2GS, BETS, BETA2SER, GAMS, MSPIKE, SPEI (this displays SPEP labs)  No results found for: KPAFRELGTCHN, LAMBDASER, KAPLAMBRATIO (kappa/lambda light chains)  No results found for: HGBA, HGBA2QUANT, HGBFQUANT,  HGBSQUAN (Hemoglobinopathy evaluation)   No results found for: LDH  No results found for: IRON, TIBC, IRONPCTSAT (Iron and TIBC)  No results found for: FERRITIN  Urinalysis    Component Value Date/Time   COLORURINE Yellow 12/29/2008 0809   APPEARANCEUR CLEAR 12/29/2008 0809   LABSPEC 1.020 12/29/2008 0809   PHURINE 6.0 12/29/2008 0809   GLUCOSEU NEGATIVE 12/29/2008 0809   BILIRUBINUR NEGATIVE 12/29/2008 0809   KETONESUR NEGATIVE 12/29/2008 0809   UROBILINOGEN 0.2 12/29/2008 0809   NITRITE NEGATIVE 12/29/2008 0809   LEUKOCYTESUR NEGATIVE 12/29/2008 0809     STUDIES: No results found.  ELIGIBLE FOR AVAILABLE RESEARCH PROTOCOL: UPBEAT, ASA: Referral placed 02/28/2017  ASSESSMENT: 62 y.o.  Shelby Payne woman s/p right breast lower inner quadrant biopsy 01/11/2017 for a cT1c cN0, stage IA invasive ductal breast cancer, estrogen receptor positive, progesterone receptor negativem HER-2 not amplified, with an Mib-1 of 20%  (1) genetics testing 03/22/2017 through the Hereditary Gene Panel offered by [facility] found no deleterious mutations in APC, ATM, AXIN2, BARD1, BMPR1A, BRCA1, BRCA2, BRIP1, CDH1, CDK4, CDKN2A (p14ARF), CDKN2A (p16INK4a), CHEK2, CTNNA1, DICER1, EPCAM (Deletion/duplication testing only), GREM1 (promoter region deletion/duplication testing only), KIT, MEN1, MLH1, MSH2, MSH3, MSH6, MUTYH, NBN, NF1, NHTL1, PALB2, PDGFRA, PMS2, POLD1, POLE, PTEN, RAD50, RAD51C, RAD51D, SDHB, SDHC, SDHD, SMAD4, SMARCA4. STK11, TP53, TSC1, TSC2, and VHL.  The following genes were evaluated for sequence changes only: SDHA and HOXB13 c.251G>A variant only.    (2) status post right lumpectomy and sentinel lymph node sampling 01/25/2017 for a pT1c pN0, stage Ia invasive ductal carcinoma, grade 2, with negative margins.  (3) Oncotype DX score of 42 predicts a 9-year risk of recurrence outside the breast of 30% if the patient's only systemic therapy is tamoxifen for 5 years.  It also predicts a significant chemotherapy benefit.  (4) adjuvant chemotherapy will consist of cyclophosphamide and doxorubicin in dose dense fashion x4 starting 03/14/2017, completed 05/09/2017, followed by weekly paclitaxel x12 started 05/23/2017  (a) cycle 3 doxorubicin /cyclophosphamide delayed 1 week because of poor tolerance  (5) adjuvant radiation to follow therapy  (6) antiestrogens to start at the completion of local treatment  PLAN:  Shelby Payne is doing well today.  Her CBC is stable today and I reviewed this with her. She will proceed with chemotherapy today with her fourth cycle of Paclitaxel (so long as CMET is within parameters).  She will return weekly and we will see her prior to every other treatment.    She knows to  call for any other issues that may develop before then.  A total of (20) minutes of face-to-face time was spent with this patient with greater than 50% of that time in counseling and care-coordination.   Wilber Bihari, NP  06/13/17 4:43 PM Medical Oncology and Hematology [facility] [address] [place], [place] [address] Tel. [phone]    Fax. [phone]

## 2017-06-14 NOTE — Telephone Encounter (Signed)
No entry 

## 2017-06-19 ENCOUNTER — Other Ambulatory Visit: Payer: Self-pay

## 2017-06-19 DIAGNOSIS — C50311 Malignant neoplasm of lower-inner quadrant of right female breast: Secondary | ICD-10-CM

## 2017-06-19 DIAGNOSIS — Z170 Estrogen receptor positive status [ER+]: Principal | ICD-10-CM

## 2017-06-20 ENCOUNTER — Inpatient Hospital Stay: Payer: BC Managed Care – PPO

## 2017-06-20 ENCOUNTER — Other Ambulatory Visit: Payer: BC Managed Care – PPO

## 2017-06-20 ENCOUNTER — Other Ambulatory Visit: Payer: Self-pay | Admitting: Oncology

## 2017-06-20 DIAGNOSIS — Z170 Estrogen receptor positive status [ER+]: Principal | ICD-10-CM

## 2017-06-20 DIAGNOSIS — C50311 Malignant neoplasm of lower-inner quadrant of right female breast: Secondary | ICD-10-CM

## 2017-06-20 DIAGNOSIS — Z95828 Presence of other vascular implants and grafts: Secondary | ICD-10-CM

## 2017-06-20 LAB — CMP (CANCER CENTER ONLY)
ALBUMIN: 3.9 g/dL (ref 3.5–5.0)
ALK PHOS: 62 U/L (ref 40–150)
ALT: 58 U/L — ABNORMAL HIGH (ref 0–55)
ANION GAP: 8 (ref 3–11)
AST: 26 U/L (ref 5–34)
BILIRUBIN TOTAL: 0.2 mg/dL (ref 0.2–1.2)
BUN: 12 mg/dL (ref 7–26)
CALCIUM: 9.5 mg/dL (ref 8.4–10.4)
CO2: 25 mmol/L (ref 22–29)
Chloride: 107 mmol/L (ref 98–109)
Creatinine: 0.75 mg/dL (ref 0.60–1.10)
GFR, Est AFR Am: >60 mL/min (ref >60)
GFR, Estimated: >60 mL/min (ref >60)
GLUCOSE: 135 mg/dL (ref 70–140)
Potassium: 3.9 mmol/L (ref 3.5–5.1)
Sodium: 140 mmol/L (ref 136–145)
TOTAL PROTEIN: 7 g/dL (ref 6.4–8.3)

## 2017-06-20 LAB — CBC WITH DIFFERENTIAL (CANCER CENTER ONLY)
BASOS ABS: 0.1 10*3/uL (ref 0.0–0.1)
BASOS PCT: 1 %
EOS ABS: 0.1 10*3/uL (ref 0.0–0.5)
Eosinophils Relative: 2 %
HCT: 34.2 % — ABNORMAL LOW (ref 34.8–46.6)
Hemoglobin: 11.6 g/dL (ref 11.6–15.9)
Lymphocytes Relative: 21 %
Lymphs Abs: 1.2 10*3/uL (ref 0.9–3.3)
MCH: 32.1 pg (ref 25.1–34.0)
MCHC: 33.9 g/dL (ref 31.5–36.0)
MCV: 94.8 fL (ref 79.5–101.0)
MONO ABS: 0.3 10*3/uL (ref 0.1–0.9)
Monocytes Relative: 5 %
NEUTROS ABS: 4.1 10*3/uL (ref 1.5–6.5)
Neutrophils Relative %: 71 %
PLATELETS: 226 10*3/uL (ref 145–400)
RBC: 3.6 MIL/uL — ABNORMAL LOW (ref 3.70–5.45)
RDW: 17.5 % — AB (ref 11.2–14.5)
WBC Count: 5.8 10*3/uL (ref 3.9–10.3)

## 2017-06-20 MED ORDER — FAMOTIDINE IN NACL 20-0.9 MG/50ML-% IV SOLN
INTRAVENOUS | Status: AC
  Filled 2017-06-20: qty 50

## 2017-06-20 MED ORDER — DEXAMETHASONE SODIUM PHOSPHATE 10 MG/ML IJ SOLN
INTRAMUSCULAR | Status: AC
  Filled 2017-06-20: qty 1

## 2017-06-20 MED ORDER — SODIUM CHLORIDE 0.9% FLUSH
10.0000 mL | INTRAVENOUS | Status: DC | PRN
  Administered 2017-06-20: 10 mL
  Filled 2017-06-20: qty 10

## 2017-06-20 MED ORDER — SODIUM CHLORIDE 0.9 % IV SOLN
65.0000 mg/m2 | Freq: Once | INTRAVENOUS | Status: AC
  Administered 2017-06-20: 138 mg via INTRAVENOUS
  Filled 2017-06-20: qty 23

## 2017-06-20 MED ORDER — SODIUM CHLORIDE 0.9 % IV SOLN
Freq: Once | INTRAVENOUS | Status: AC
  Administered 2017-06-20: 15:00:00 via INTRAVENOUS

## 2017-06-20 MED ORDER — DIPHENHYDRAMINE HCL 50 MG/ML IJ SOLN
INTRAMUSCULAR | Status: AC
  Filled 2017-06-20: qty 1

## 2017-06-20 MED ORDER — DEXAMETHASONE SODIUM PHOSPHATE 10 MG/ML IJ SOLN
4.0000 mg | Freq: Once | INTRAMUSCULAR | Status: AC
  Administered 2017-06-20: 4 mg via INTRAVENOUS

## 2017-06-20 MED ORDER — DIPHENHYDRAMINE HCL 50 MG/ML IJ SOLN
25.0000 mg | Freq: Once | INTRAMUSCULAR | Status: AC
  Administered 2017-06-20: 25 mg via INTRAVENOUS

## 2017-06-20 MED ORDER — HEPARIN SOD (PORK) LOCK FLUSH 100 UNIT/ML IV SOLN
500.0000 [IU] | Freq: Once | INTRAVENOUS | Status: AC | PRN
  Administered 2017-06-20: 500 [IU]
  Filled 2017-06-20: qty 5

## 2017-06-20 MED ORDER — FAMOTIDINE IN NACL 20-0.9 MG/50ML-% IV SOLN
20.0000 mg | Freq: Once | INTRAVENOUS | Status: AC
  Administered 2017-06-20: 20 mg via INTRAVENOUS

## 2017-06-20 NOTE — Patient Instructions (Signed)
[facility] Discharge Instructions for Patients Receiving Chemotherapy  Today you received the following chemotherapy agents:  Taxol.  To help prevent nausea and vomiting after your treatment, we encourage you to take your nausea medication as directed.   If you develop nausea and vomiting that is not controlled by your nausea medication, call the clinic.   BELOW ARE SYMPTOMS THAT SHOULD BE REPORTED IMMEDIATELY:  *FEVER GREATER THAN 100.5 F  *CHILLS WITH OR WITHOUT FEVER  NAUSEA AND VOMITING THAT IS NOT CONTROLLED WITH YOUR NAUSEA MEDICATION  *UNUSUAL SHORTNESS OF BREATH  *UNUSUAL BRUISING OR BLEEDING  TENDERNESS IN MOUTH AND THROAT WITH OR WITHOUT PRESENCE OF ULCERS  *URINARY PROBLEMS  *BOWEL PROBLEMS  UNUSUAL RASH Items with * indicate a potential emergency and should be followed up as soon as possible.  Feel free to call the clinic should you have any questions or concerns. The clinic phone number is [phone].  Please show the CHEMO ALERT CARD at check-in to the Emergency Department and triage nurse.   

## 2017-06-26 ENCOUNTER — Other Ambulatory Visit: Payer: Self-pay | Admitting: *Deleted

## 2017-06-26 DIAGNOSIS — C50311 Malignant neoplasm of lower-inner quadrant of right female breast: Secondary | ICD-10-CM

## 2017-06-26 DIAGNOSIS — Z170 Estrogen receptor positive status [ER+]: Principal | ICD-10-CM

## 2017-06-27 ENCOUNTER — Encounter: Payer: Self-pay | Admitting: Adult Health

## 2017-06-27 ENCOUNTER — Inpatient Hospital Stay: Payer: BC Managed Care – PPO

## 2017-06-27 ENCOUNTER — Telehealth: Payer: Self-pay | Admitting: Adult Health

## 2017-06-27 ENCOUNTER — Other Ambulatory Visit: Payer: Self-pay | Admitting: Oncology

## 2017-06-27 ENCOUNTER — Other Ambulatory Visit: Payer: BC Managed Care – PPO

## 2017-06-27 ENCOUNTER — Inpatient Hospital Stay (HOSPITAL_BASED_OUTPATIENT_CLINIC_OR_DEPARTMENT_OTHER): Payer: BC Managed Care – PPO | Admitting: Adult Health

## 2017-06-27 VITALS — BP 123/81 | HR 81 | Temp 98.6°F | Resp 18 | Ht 64.0 in | Wt 241.1 lb

## 2017-06-27 DIAGNOSIS — I10 Essential (primary) hypertension: Secondary | ICD-10-CM | POA: Diagnosis not present

## 2017-06-27 DIAGNOSIS — R5383 Other fatigue: Secondary | ICD-10-CM

## 2017-06-27 DIAGNOSIS — Z170 Estrogen receptor positive status [ER+]: Principal | ICD-10-CM

## 2017-06-27 DIAGNOSIS — C50311 Malignant neoplasm of lower-inner quadrant of right female breast: Secondary | ICD-10-CM

## 2017-06-27 DIAGNOSIS — Z95828 Presence of other vascular implants and grafts: Secondary | ICD-10-CM

## 2017-06-27 LAB — CMP (CANCER CENTER ONLY)
ALBUMIN: 4 g/dL (ref 3.5–5.0)
ALT: 75 U/L — ABNORMAL HIGH (ref 0–55)
ANION GAP: 10 (ref 3–11)
AST: 37 U/L — AB (ref 5–34)
Alkaline Phosphatase: 55 U/L (ref 40–150)
BUN: 17 mg/dL (ref 7–26)
CHLORIDE: 104 mmol/L (ref 98–109)
CO2: 23 mmol/L (ref 22–29)
Calcium: 9.4 mg/dL (ref 8.4–10.4)
Creatinine: 0.76 mg/dL (ref 0.60–1.10)
GFR, Est AFR Am: >60 mL/min (ref >60)
GFR, Estimated: >60 mL/min (ref >60)
GLUCOSE: 96 mg/dL (ref 70–140)
POTASSIUM: 4.4 mmol/L (ref 3.5–5.1)
Sodium: 137 mmol/L (ref 136–145)
Total Bilirubin: 0.3 mg/dL (ref 0.2–1.2)
Total Protein: 7.1 g/dL (ref 6.4–8.3)

## 2017-06-27 LAB — CBC WITH DIFFERENTIAL (CANCER CENTER ONLY)
BASOS ABS: 0.1 10*3/uL (ref 0.0–0.1)
Basophils Relative: 1 %
Eosinophils Absolute: 0.1 10*3/uL (ref 0.0–0.5)
Eosinophils Relative: 2 %
HEMATOCRIT: 33.9 % — AB (ref 34.8–46.6)
Hemoglobin: 11.4 g/dL — ABNORMAL LOW (ref 11.6–15.9)
LYMPHS ABS: 1.2 10*3/uL (ref 0.9–3.3)
LYMPHS PCT: 19 %
MCH: 32.2 pg (ref 25.1–34.0)
MCHC: 33.8 g/dL (ref 31.5–36.0)
MCV: 95.4 fL (ref 79.5–101.0)
MONO ABS: 0.4 10*3/uL (ref 0.1–0.9)
Monocytes Relative: 7 %
NEUTROS ABS: 4.5 10*3/uL (ref 1.5–6.5)
Neutrophils Relative %: 71 %
Platelet Count: 250 10*3/uL (ref 145–400)
RBC: 3.55 MIL/uL — AB (ref 3.70–5.45)
RDW: 16.4 % — AB (ref 11.2–14.5)
WBC Count: 6.3 10*3/uL (ref 3.9–10.3)

## 2017-06-27 MED ORDER — SODIUM CHLORIDE 0.9 % IV SOLN
Freq: Once | INTRAVENOUS | Status: AC
  Administered 2017-06-27: 12:00:00 via INTRAVENOUS

## 2017-06-27 MED ORDER — DEXAMETHASONE SODIUM PHOSPHATE 10 MG/ML IJ SOLN
4.0000 mg | Freq: Once | INTRAMUSCULAR | Status: AC
  Administered 2017-06-27: 4 mg via INTRAVENOUS

## 2017-06-27 MED ORDER — SODIUM CHLORIDE 0.9 % IV SOLN
65.0000 mg/m2 | Freq: Once | INTRAVENOUS | Status: AC
  Administered 2017-06-27: 138 mg via INTRAVENOUS
  Filled 2017-06-27: qty 23

## 2017-06-27 MED ORDER — FAMOTIDINE IN NACL 20-0.9 MG/50ML-% IV SOLN
20.0000 mg | Freq: Once | INTRAVENOUS | Status: AC
  Administered 2017-06-27: 20 mg via INTRAVENOUS

## 2017-06-27 MED ORDER — FAMOTIDINE IN NACL 20-0.9 MG/50ML-% IV SOLN
INTRAVENOUS | Status: AC
  Filled 2017-06-27: qty 50

## 2017-06-27 MED ORDER — DIPHENHYDRAMINE HCL 50 MG/ML IJ SOLN
25.0000 mg | Freq: Once | INTRAMUSCULAR | Status: AC
  Administered 2017-06-27: 25 mg via INTRAVENOUS

## 2017-06-27 MED ORDER — DIPHENHYDRAMINE HCL 50 MG/ML IJ SOLN
INTRAMUSCULAR | Status: AC
  Filled 2017-06-27: qty 1

## 2017-06-27 MED ORDER — SODIUM CHLORIDE 0.9% FLUSH
10.0000 mL | INTRAVENOUS | Status: DC | PRN
  Administered 2017-06-27: 10 mL
  Filled 2017-06-27: qty 10

## 2017-06-27 MED ORDER — HEPARIN SOD (PORK) LOCK FLUSH 100 UNIT/ML IV SOLN
500.0000 [IU] | Freq: Once | INTRAVENOUS | Status: AC | PRN
  Administered 2017-06-27: 500 [IU]
  Filled 2017-06-27: qty 5

## 2017-06-27 MED ORDER — DEXAMETHASONE SODIUM PHOSPHATE 10 MG/ML IJ SOLN
INTRAMUSCULAR | Status: AC
  Filled 2017-06-27: qty 1

## 2017-06-27 NOTE — Progress Notes (Signed)
Shelby Payne  [phone] [phone]     ID: Shelby Payne DOB: 1955/08/10  MR#: [id number]  IOE#:[id number]  Patient Care Team: Shelby Koch, MD as PCP - General (Internal Medicine) Shelby Payne, Shelby Dad, MD as Consulting Physician (Oncology) Shelby Gibson, MD as Attending Physician (Radiation Oncology) Shelby Luna, MD as Consulting Physician (General Surgery) Shelby Slates, MD as Physician Assistant (Radiology) OTHER MD:  CHIEF COMPLAINT: Estrogen receptor positive breast cancer  CURRENT TREATMENT: Adjuvant chemotherapy    HISTORY OF CURRENT ILLNESS: From the original intake note:  The patient had bilateral screening mammography at [facility] 12/28/2016.  There was a new mass in the right breast lower inner quadrant and some new grouped calcifications in the right breast same quadrant.  Accordingly the patient was called back for right diagnostic mammography and ultrasonography 01/09/2017.  The breast density was category A.  In the right breast lower inner quadrant there was a new irregular mass.  Again noted was a new group of heterogeneous calcifications in the same quadrant.  Ultrasound confirmed a 1.7 cm irregular mass in the right breast lower inner quadrant.  There were no abnormalities sonographically in the right axilla.  Taken together,  the mass plus calcifications measured up to 4 cm.  Accordingly on 01/11/2017 she underwent biopsy of the right breast mass in question, showing [phone]) invasive ductal carcinoma, grade 2, estrogen receptor 90% positive with strong staining intensity, progesterone receptor negative, with an Mib-1 of 20% and no HER-2 amplification, the signals ratio being 1.26 and the number per cell 2.20  The patient's subsequent history is as detailed below.  INTERVAL HISTORY: Shelby Payne returns today for follow up and treatment of her estrogen receptor positive breast cancer. Today is day 1 cycle 6 of 12 planned  weekly paclitaxel doses.  Shelby Payne is tolerating the chemotherapy relatively well.     REVIEW OF SYSTEMS: Shelby Payne is doing well.  She continues to have some energy the day following chemotherapy.  The days following she does feel fatigued.  She is still functioning pretty well, such as doing stuff inside the home.  She did go out for Mother's Day.  She denies peripheral neuropathy.  She noted some painful bowel movements in her intestines a couple of weeks ago that has since resolved.    PAST MEDICAL HISTORY: Past Medical History:  Diagnosis Date  . Acute sinusitis, unspecified   . Anxiety   . Cancer ([place]) 12/2016   right breast cancer  . Depression   . Family history of prostate cancer   . Hypertension   . Obesity, unspecified   . Pneumonia, organism unspecified(486)     PAST SURGICAL HISTORY: Past Surgical History:  Procedure Laterality Date  . BREAST LUMPECTOMY WITH RADIOACTIVE SEED AND SENTINEL LYMPH NODE BIOPSY Right 01/25/2017   Procedure: RIGHT BREAST LUMPECTOMY  WITH 2 RADIOACTIVE SEEDS AND SENTINEL LYMPH NODE BIOPSY;  Surgeon: Shelby Luna, MD;  Location: [place];  Service: General;  Laterality: Right;  . PORTACATH PLACEMENT Right 03/13/2017   Procedure: ULTRASOUND GUIDED INSERTION PORT-A-CATH RIGHT INTERNAL JUGULAR;  Surgeon: Shelby Luna, MD;  Location: [place];  Service: General;  Laterality: Right;  . WISDOM TOOTH EXTRACTION      FAMILY HISTORY Family History  Problem Relation Age of Onset  . Hypertension Mother   . Hypothyroidism Mother   . Hyperlipidemia Mother   . Gout Mother   . Breast cancer Mother 93  . Heart disease Father   . Hypertension Father   .  Diabetes Father   . Hypothyroidism Father   . Prostate cancer Father        dx late 20s  . Hypothyroidism Sister   . Cancer Sister        angiosarcoma - mets  . Esophageal cancer Maternal Uncle   . Stroke Paternal Aunt   . Stroke Paternal Uncle   . Heart attack Maternal Grandfather   .  ALS Paternal Grandfather   . COPD Neg Hx   . Colon cancer Neg Hx   . Rectal cancer Neg Hx   . Stomach cancer Neg Hx   Her father passed away from a massive stroke at 52. Her mother passed away from complications of breast cancer at 45. She had been diagnosed age 67. The patient's sister had angiosarcoma at 5. There is no otherhistory of breast or ovarian cancer in this famiy. The patient is of Ashkenazi extraction  GYNECOLOGIC HISTORY:  Patient's last menstrual period was 07/12/2011.  Menarche age 11, first live birth age 16, she is GXP2. Menopause 2015, never used OCPs or HR   SOCIAL HISTORY:  She is a Electrical engineer at [employer]. Her husband, [name] was a Curator at [employer] in [place] law, but is retired now. Her daughter [name] teaches nursing at [employer]. [name] is concerned that on her father's side a cousin has tested positive for a deleterious gene; she will try to bring [name] that information]. Son, [name] passed away. The patient does not have any grandchildren. She attends [employer].     ADVANCED DIRECTIVES:    HEALTH MAINTENANCE: Social History   Tobacco Use  . Smoking status: Never Smoker  . Smokeless tobacco: Never Used  Substance Use Topics  . Alcohol use: Yes    Comment: rare glass of wine  . Drug use: No     Colonoscopy: 2013  PAP:  Bone density:never   Allergies  Allergen Reactions  . Hctz [Hydrochlorothiazide] Other (See Comments)    Hyponatremia  . Bactrim [Sulfamethoxazole-Trimethoprim] Rash    Current Outpatient Medications  Medication Sig Dispense Refill  . b complex vitamins tablet Take 1 tablet by mouth daily.    . Coenzyme Q10 (CO Q-10 PO) Take 1 capsule by mouth daily.     [name] Kitchen dexamethasone (DECADRON) 4 MG tablet Take 2 tablets by mouth once a day on the day after chemotherapy and then take 2 tablets two times a day for 2 days. Take with food. (Patient taking differently: Take 8 mg by mouth See admin  instructions. Take 8 mg by mouth once a day on the day after chemotherapy and then take 8 mg two times a day for 2 days. Take with food.) 30 tablet 1  . lidocaine-prilocaine (EMLA) cream Apply to affected area once 30 g 3  . metoprolol succinate (TOPROL-XL) 100 MG 24 hr tablet Take 1 tablet (100 mg total) daily by mouth. 90 tablet 3  . Omega-3 Fatty Acids (FISH OIL PO) Take 1 capsule by mouth daily.     . ondansetron (ZOFRAN) 8 MG tablet Take 1 tablet (8 mg total) by mouth every 8 (eight) hours as needed for nausea or vomiting. Do not take within 3 days of chemotherapy dose. 20 tablet 0  . prochlorperazine (COMPAZINE) 10 MG tablet Take 1 tablet (10 mg total) by mouth every 6 (six) hours as needed (Nausea or vomiting). 30 tablet 1  . sertraline (ZOLOFT) 100 MG tablet Take 1 tablet (100 mg total) daily by mouth. 90 tablet 3  .  Telmisartan-Amlodipine 40-5 MG TABS Take 1 tablet daily by mouth. 90 tablet 3  . triamcinolone cream (KENALOG) 0.1 % Apply 1 application 2 (two) times daily topically. (Patient taking differently: Apply 1 application topically 2 (two) times daily as needed (for rash). ) 30 g 6  . LORazepam (ATIVAN) 0.5 MG tablet Take 1 tablet (0.5 mg total) by mouth at bedtime as needed (Nausea or vomiting). (Patient not taking: Reported on 04/18/2017) 30 tablet 0   No current facility-administered medications for this visit.    Facility-Administered Medications Ordered in Other Visits  Medication Dose Route Frequency Provider Last Rate Last Dose  . sodium chloride flush (NS) 0.9 % injection 10 mL  10 mL Intracatheter PRN Shelby Payne, Shelby Dad, MD   10 mL at 06/27/17 1406    OBJECTIVE:  Vitals:   06/27/17 1118  BP: 123/81  Pulse: 81  Resp: 18  Temp: 98.6 F (37 C)  SpO2: 98%     Body mass index is 41.38 kg/m.   Wt Readings from Last 3 Encounters:  06/27/17 241 lb 1.6 oz (109.4 kg)  06/13/17 237 lb 11.2 oz (107.8 kg)  05/30/17 239 lb 8 oz (108.6 kg)  ECOG FS:1 - Symptomatic but  completely ambulatory GENERAL: Patient is a well appearing female in no acute distress HEENT:  Sclerae anicteric.  Oropharynx clear and moist. No ulcerations or evidence of oropharyngeal candidiasis. Neck is supple.  NODES:  No cervical, supraclavicular, or axillary lymphadenopathy palpated.  BREAST EXAM:  Deferred. LUNGS:  Clear to auscultation bilaterally.  No wheezes or rhonchi. HEART:  Regular rate and rhythm. No murmur appreciated. ABDOMEN:  Soft, nontender.  Positive, normoactive bowel sounds. No organomegaly palpated. MSK:  No focal spinal tenderness to palpation. Full range of motion bilaterally in the upper extremities. EXTREMITIES:  No peripheral edema.   SKIN:  Clear with no obvious rashes or skin changes. No nail dyscrasia. NEURO:  Nonfocal. Well oriented.  Appropriate affect.      LAB RESULTS:  CMP     Component Value Date/Time   NA 137 06/27/2017 1016   NA 137 01/18/2017 1238   K 4.4 06/27/2017 1016   K 4.1 01/18/2017 1238   CL 104 06/27/2017 1016   CO2 23 06/27/2017 1016   CO2 22 01/18/2017 1238   GLUCOSE 96 06/27/2017 1016   GLUCOSE 91 01/18/2017 1238   BUN 17 06/27/2017 1016   BUN 19.5 01/18/2017 1238   CREATININE 0.76 06/27/2017 1016   CREATININE 0.7 01/18/2017 1238   CALCIUM 9.4 06/27/2017 1016   CALCIUM 9.8 01/18/2017 1238   PROT 7.1 06/27/2017 1016   PROT 7.9 01/18/2017 1238   ALBUMIN 4.0 06/27/2017 1016   ALBUMIN 4.3 01/18/2017 1238   AST 37 (H) 06/27/2017 1016   AST 19 01/18/2017 1238   ALT 75 (H) 06/27/2017 1016   ALT 24 01/18/2017 1238   ALKPHOS 55 06/27/2017 1016   ALKPHOS 53 01/18/2017 1238   BILITOT 0.3 06/27/2017 1016   BILITOT 0.39 01/18/2017 1238   GFRNONAA >60 06/27/2017 1016   GFRAA >60 06/27/2017 1016    No results found for: TOTALPROTELP, ALBUMINELP, A1GS, A2GS, BETS, BETA2SER, GAMS, MSPIKE, SPEI  No results found for: KPAFRELGTCHN, LAMBDASER, KAPLAMBRATIO  Lab Results  Component Value Date   WBC 6.3 06/27/2017   NEUTROABS  4.5 06/27/2017   HGB 11.4 (L) 06/27/2017   HCT 33.9 (L) 06/27/2017   MCV 95.4 06/27/2017   PLT 250 06/27/2017    _0 @  No results found for: LABCA2  No components found for: ZHGDJM426  No results for input(s): INR in the last 168 hours.  No results found for: LABCA2  No results found for: STM196  No results found for: QIW979  No results found for: GXQ119  No results found for: CA2729  No components found for: HGQUANT  No results found for: CEA1 / No results found for: CEA1   No results found for: AFPTUMOR  No results found for: CHROMOGRNA  No results found for: PSA1  Appointment on 06/27/2017  Component Date Value Ref Range Status  . Sodium 06/27/2017 137  136 - 145 mmol/L Final  . Potassium 06/27/2017 4.4  3.5 - 5.1 mmol/L Final  . Chloride 06/27/2017 104  98 - 109 mmol/L Final  . CO2 06/27/2017 23  22 - 29 mmol/L Final  . Glucose, Bld 06/27/2017 96  70 - 140 mg/dL Final  . BUN 06/27/2017 17  7 - 26 mg/dL Final  . Creatinine 06/27/2017 0.76  0.60 - 1.10 mg/dL Final  . Calcium 06/27/2017 9.4  8.4 - 10.4 mg/dL Final  . Total Protein 06/27/2017 7.1  6.4 - 8.3 g/dL Final  . Albumin 06/27/2017 4.0  3.5 - 5.0 g/dL Final  . AST 06/27/2017 37* 5 - 34 U/L Final  . ALT 06/27/2017 75* 0 - 55 U/L Final  . Alkaline Phosphatase 06/27/2017 55  40 - 150 U/L Final  . Total Bilirubin 06/27/2017 0.3  0.2 - 1.2 mg/dL Final  . GFR, Est Non Af Am 06/27/2017 >60  >60 mL/min Final  . GFR, Est AFR Am 06/27/2017 >60  >60 mL/min Final   Comment: (NOTE) The eGFR has been calculated using the CKD EPI equation. This calculation has not been validated in all clinical situations. eGFR's persistently <60 mL/min signify possible Chronic Kidney Disease.   Georgiann Hahn gap 06/27/2017 10  3 - 11 Final   Performed at [facility], [place] [address] [place], [place] [address]  . WBC Count 06/27/2017 6.3  3.9 - 10.3 K/uL Final  . RBC 06/27/2017 3.55* 3.70 - 5.45  MIL/uL Final  . Hemoglobin 06/27/2017 11.4* 11.6 - 15.9 g/dL Final  . HCT 06/27/2017 33.9* 34.8 - 46.6 % Final  . MCV 06/27/2017 95.4  79.5 - 101.0 fL Final  . MCH 06/27/2017 32.2  25.1 - 34.0 pg Final  . MCHC 06/27/2017 33.8  31.5 - 36.0 g/dL Final  . RDW 06/27/2017 16.4* 11.2 - 14.5 % Final  . Platelet Count 06/27/2017 250  145 - 400 K/uL Final  . Neutrophils Relative % 06/27/2017 71  % Final  . Neutro Abs 06/27/2017 4.5  1.5 - 6.5 K/uL Final  . Lymphocytes Relative 06/27/2017 19  % Final  . Lymphs Abs 06/27/2017 1.2  0.9 - 3.3 K/uL Final  . Monocytes Relative 06/27/2017 7  % Final  . Monocytes Absolute 06/27/2017 0.4  0.1 - 0.9 K/uL Final  . Eosinophils Relative 06/27/2017 2  % Final  . Eosinophils Absolute 06/27/2017 0.1  0.0 - 0.5 K/uL Final  . Basophils Relative 06/27/2017 1  % Final  . Basophils Absolute 06/27/2017 0.1  0.0 - 0.1 K/uL Final   Performed at [facility], [place] [address], [place],  [address]    (this displays the last labs from the last 3 days)  No results found for: TOTALPROTELP, ALBUMINELP, A1GS, A2GS, BETS, BETA2SER, GAMS, MSPIKE, SPEI (this displays SPEP labs)  No results found for: KPAFRELGTCHN, LAMBDASER, KAPLAMBRATIO (kappa/lambda light chains)  No  results found for: HGBA, HGBA2QUANT, HGBFQUANT, HGBSQUAN (Hemoglobinopathy evaluation)   No results found for: LDH  No results found for: IRON, TIBC, IRONPCTSAT (Iron and TIBC)  No results found for: FERRITIN  Urinalysis    Component Value Date/Time   COLORURINE Yellow 12/29/2008 0809   APPEARANCEUR CLEAR 12/29/2008 0809   LABSPEC 1.020 12/29/2008 0809   PHURINE 6.0 12/29/2008 0809   GLUCOSEU NEGATIVE 12/29/2008 0809   BILIRUBINUR NEGATIVE 12/29/2008 0809   KETONESUR NEGATIVE 12/29/2008 0809   UROBILINOGEN 0.2 12/29/2008 0809   NITRITE NEGATIVE 12/29/2008 0809   LEUKOCYTESUR NEGATIVE 12/29/2008 0809     STUDIES: No results found.  ELIGIBLE FOR AVAILABLE  RESEARCH PROTOCOL: UPBEAT, ASA: Referral placed 02/28/2017  ASSESSMENT: 62 y.o. [place] woman s/p right breast lower inner quadrant biopsy 01/11/2017 for a cT1c cN0, stage IA invasive ductal breast cancer, estrogen receptor positive, progesterone receptor negativem HER-2 not amplified, with an Mib-1 of 20%  (1) genetics testing 03/22/2017 through the Hereditary Gene Panel offered by [facility] found no deleterious mutations in APC, ATM, AXIN2, BARD1, BMPR1A, BRCA1, BRCA2, BRIP1, CDH1, CDK4, CDKN2A (p14ARF), CDKN2A (p16INK4a), CHEK2, CTNNA1, DICER1, EPCAM (Deletion/duplication testing only), GREM1 (promoter region deletion/duplication testing only), KIT, MEN1, MLH1, MSH2, MSH3, MSH6, MUTYH, NBN, NF1, NHTL1, PALB2, PDGFRA, PMS2, POLD1, POLE, PTEN, RAD50, RAD51C, RAD51D, SDHB, SDHC, SDHD, SMAD4, SMARCA4. STK11, TP53, TSC1, TSC2, and VHL.  The following genes were evaluated for sequence changes only: SDHA and HOXB13 c.251G>A variant only.    (2) status post right lumpectomy and sentinel lymph node sampling 01/25/2017 for a pT1c pN0, stage Ia invasive ductal carcinoma, grade 2, with negative margins.  (3) Oncotype DX score of 42 predicts a 9-year risk of recurrence outside the breast of 30% if the patient's only systemic therapy is tamoxifen for 5 years.  It also predicts a significant chemotherapy benefit.  (4) adjuvant chemotherapy will consist of cyclophosphamide and doxorubicin in dose dense fashion x4 starting 03/14/2017, completed 05/09/2017, followed by weekly paclitaxel x12 started 05/23/2017  (a) cycle 3 doxorubicin /cyclophosphamide delayed 1 week because of poor tolerance  (5) adjuvant radiation to follow therapy  (6) antiestrogens to start at the completion of local treatment  PLAN:  Evora is doing well today.  Her labs are stable and she continues to tolerate the chemotherapy with Paclitaxel well.  She is not experiencing any neuropathy.  She is fatigued, which is expected.  She is doing  well.  She will continue to return for weekly treatment, and see myself or Dr. Jana Hakim with every other treatment.    She knows to call for any other issues that may develop before then.  A total of (20) minutes of face-to-face time was spent with this patient with greater than 50% of that time in counseling and care-coordination.   Wilber Bihari, NP  06/27/17 3:00 PM Medical Oncology and Hematology [facility] [address] [place], [place] [address] Tel. [phone]    Fax. [phone]

## 2017-06-27 NOTE — Telephone Encounter (Signed)
Per 5/21 no los °

## 2017-06-27 NOTE — Patient Instructions (Signed)
[facility] Discharge Instructions for Patients Receiving Chemotherapy  Today you received the following chemotherapy agents:  Taxol.  To help prevent nausea and vomiting after your treatment, we encourage you to take your nausea medication as directed.   If you develop nausea and vomiting that is not controlled by your nausea medication, call the clinic.   BELOW ARE SYMPTOMS THAT SHOULD BE REPORTED IMMEDIATELY:  *FEVER GREATER THAN 100.5 F  *CHILLS WITH OR WITHOUT FEVER  NAUSEA AND VOMITING THAT IS NOT CONTROLLED WITH YOUR NAUSEA MEDICATION  *UNUSUAL SHORTNESS OF BREATH  *UNUSUAL BRUISING OR BLEEDING  TENDERNESS IN MOUTH AND THROAT WITH OR WITHOUT PRESENCE OF ULCERS  *URINARY PROBLEMS  *BOWEL PROBLEMS  UNUSUAL RASH Items with * indicate a potential emergency and should be followed up as soon as possible.  Feel free to call the clinic should you have any questions or concerns. The clinic phone number is [phone].  Please show the CHEMO ALERT CARD at check-in to the Emergency Department and triage nurse.   

## 2017-06-30 ENCOUNTER — Other Ambulatory Visit: Payer: Self-pay

## 2017-06-30 DIAGNOSIS — C50311 Malignant neoplasm of lower-inner quadrant of right female breast: Secondary | ICD-10-CM

## 2017-06-30 DIAGNOSIS — Z170 Estrogen receptor positive status [ER+]: Principal | ICD-10-CM

## 2017-07-04 ENCOUNTER — Inpatient Hospital Stay: Payer: BC Managed Care – PPO

## 2017-07-04 VITALS — BP 130/82 | HR 87 | Temp 98.4°F | Resp 16 | Wt 242.2 lb

## 2017-07-04 DIAGNOSIS — Z170 Estrogen receptor positive status [ER+]: Principal | ICD-10-CM

## 2017-07-04 DIAGNOSIS — C50311 Malignant neoplasm of lower-inner quadrant of right female breast: Secondary | ICD-10-CM

## 2017-07-04 DIAGNOSIS — Z95828 Presence of other vascular implants and grafts: Secondary | ICD-10-CM

## 2017-07-04 LAB — CBC WITH DIFFERENTIAL (CANCER CENTER ONLY)
Basophils Absolute: 0.1 10*3/uL (ref 0.0–0.1)
Basophils Relative: 1 %
Eosinophils Absolute: 0.1 10*3/uL (ref 0.0–0.5)
Eosinophils Relative: 2 %
HCT: 35.4 % (ref 34.8–46.6)
HEMOGLOBIN: 11.8 g/dL (ref 11.6–15.9)
LYMPHS ABS: 1.3 10*3/uL (ref 0.9–3.3)
LYMPHS PCT: 24 %
MCH: 32 pg (ref 25.1–34.0)
MCHC: 33.2 g/dL (ref 31.5–36.0)
MCV: 96.1 fL (ref 79.5–101.0)
Monocytes Absolute: 0.4 10*3/uL (ref 0.1–0.9)
Monocytes Relative: 7 %
NEUTROS PCT: 66 %
Neutro Abs: 3.5 10*3/uL (ref 1.5–6.5)
Platelet Count: 275 10*3/uL (ref 145–400)
RBC: 3.68 MIL/uL — AB (ref 3.70–5.45)
RDW: 15.4 % — ABNORMAL HIGH (ref 11.2–14.5)
WBC: 5.4 10*3/uL (ref 3.9–10.3)

## 2017-07-04 LAB — COMPREHENSIVE METABOLIC PANEL
ALK PHOS: 52 U/L (ref 38–126)
ALT: 54 U/L (ref 14–54)
AST: 30 U/L (ref 15–41)
Albumin: 4.2 g/dL (ref 3.5–5.0)
Anion gap: 12 (ref 5–15)
BILIRUBIN TOTAL: 0.5 mg/dL (ref 0.3–1.2)
BUN: 17 mg/dL (ref 6–20)
CALCIUM: 9.4 mg/dL (ref 8.9–10.3)
CO2: 23 mmol/L (ref 22–32)
CREATININE: 0.71 mg/dL (ref 0.44–1.00)
Chloride: 103 mmol/L (ref 101–111)
Glucose, Bld: 114 mg/dL — ABNORMAL HIGH (ref 65–99)
Potassium: 4.2 mmol/L (ref 3.5–5.1)
Sodium: 138 mmol/L (ref 135–145)
TOTAL PROTEIN: 7.3 g/dL (ref 6.5–8.1)

## 2017-07-04 MED ORDER — FAMOTIDINE IN NACL 20-0.9 MG/50ML-% IV SOLN
20.0000 mg | Freq: Once | INTRAVENOUS | Status: AC
  Administered 2017-07-04: 20 mg via INTRAVENOUS

## 2017-07-04 MED ORDER — SODIUM CHLORIDE 0.9 % IV SOLN
Freq: Once | INTRAVENOUS | Status: AC
  Administered 2017-07-04: 12:00:00 via INTRAVENOUS

## 2017-07-04 MED ORDER — SODIUM CHLORIDE 0.9% FLUSH
10.0000 mL | INTRAVENOUS | Status: DC | PRN
  Administered 2017-07-04: 10 mL
  Filled 2017-07-04: qty 10

## 2017-07-04 MED ORDER — SODIUM CHLORIDE 0.9 % IV SOLN
65.0000 mg/m2 | Freq: Once | INTRAVENOUS | Status: AC
  Administered 2017-07-04: 138 mg via INTRAVENOUS
  Filled 2017-07-04: qty 23

## 2017-07-04 MED ORDER — DIPHENHYDRAMINE HCL 50 MG/ML IJ SOLN
25.0000 mg | Freq: Once | INTRAMUSCULAR | Status: AC
  Administered 2017-07-04: 25 mg via INTRAVENOUS

## 2017-07-04 MED ORDER — FAMOTIDINE IN NACL 20-0.9 MG/50ML-% IV SOLN
INTRAVENOUS | Status: AC
  Filled 2017-07-04: qty 50

## 2017-07-04 MED ORDER — HEPARIN SOD (PORK) LOCK FLUSH 100 UNIT/ML IV SOLN
500.0000 [IU] | Freq: Once | INTRAVENOUS | Status: AC | PRN
  Administered 2017-07-04: 500 [IU]
  Filled 2017-07-04: qty 5

## 2017-07-04 MED ORDER — DIPHENHYDRAMINE HCL 50 MG/ML IJ SOLN
INTRAMUSCULAR | Status: AC
  Filled 2017-07-04: qty 1

## 2017-07-04 MED ORDER — DEXAMETHASONE SODIUM PHOSPHATE 10 MG/ML IJ SOLN
INTRAMUSCULAR | Status: AC
  Filled 2017-07-04: qty 1

## 2017-07-04 MED ORDER — DEXAMETHASONE SODIUM PHOSPHATE 10 MG/ML IJ SOLN
4.0000 mg | Freq: Once | INTRAMUSCULAR | Status: AC
  Administered 2017-07-04: 4 mg via INTRAVENOUS

## 2017-07-04 NOTE — Patient Instructions (Signed)
[facility] Discharge Instructions for Patients Receiving Chemotherapy  Today you received the following chemotherapy agents:  Taxol.  To help prevent nausea and vomiting after your treatment, we encourage you to take your nausea medication as directed.   If you develop nausea and vomiting that is not controlled by your nausea medication, call the clinic.   BELOW ARE SYMPTOMS THAT SHOULD BE REPORTED IMMEDIATELY:  *FEVER GREATER THAN 100.5 F  *CHILLS WITH OR WITHOUT FEVER  NAUSEA AND VOMITING THAT IS NOT CONTROLLED WITH YOUR NAUSEA MEDICATION  *UNUSUAL SHORTNESS OF BREATH  *UNUSUAL BRUISING OR BLEEDING  TENDERNESS IN MOUTH AND THROAT WITH OR WITHOUT PRESENCE OF ULCERS  *URINARY PROBLEMS  *BOWEL PROBLEMS  UNUSUAL RASH Items with * indicate a potential emergency and should be followed up as soon as possible.  Feel free to call the clinic should you have any questions or concerns. The clinic phone number is [phone].  Please show the CHEMO ALERT CARD at check-in to the Emergency Department and triage nurse.   

## 2017-07-11 ENCOUNTER — Encounter: Payer: Self-pay | Admitting: *Deleted

## 2017-07-11 ENCOUNTER — Inpatient Hospital Stay: Payer: Self-pay | Attending: Oncology

## 2017-07-11 ENCOUNTER — Other Ambulatory Visit: Payer: BC Managed Care – PPO

## 2017-07-11 ENCOUNTER — Inpatient Hospital Stay (HOSPITAL_BASED_OUTPATIENT_CLINIC_OR_DEPARTMENT_OTHER): Payer: Self-pay | Admitting: Adult Health

## 2017-07-11 ENCOUNTER — Inpatient Hospital Stay: Payer: Self-pay

## 2017-07-11 ENCOUNTER — Encounter: Payer: Self-pay | Admitting: Adult Health

## 2017-07-11 VITALS — BP 120/83 | HR 83 | Temp 98.0°F | Resp 18 | Ht 64.0 in | Wt 240.9 lb

## 2017-07-11 DIAGNOSIS — Z170 Estrogen receptor positive status [ER+]: Principal | ICD-10-CM

## 2017-07-11 DIAGNOSIS — I10 Essential (primary) hypertension: Secondary | ICD-10-CM

## 2017-07-11 DIAGNOSIS — Z95828 Presence of other vascular implants and grafts: Secondary | ICD-10-CM

## 2017-07-11 DIAGNOSIS — G629 Polyneuropathy, unspecified: Secondary | ICD-10-CM | POA: Insufficient documentation

## 2017-07-11 DIAGNOSIS — C50311 Malignant neoplasm of lower-inner quadrant of right female breast: Secondary | ICD-10-CM

## 2017-07-11 DIAGNOSIS — Z5111 Encounter for antineoplastic chemotherapy: Secondary | ICD-10-CM | POA: Insufficient documentation

## 2017-07-11 LAB — CBC WITH DIFFERENTIAL (CANCER CENTER ONLY)
BASOS ABS: 0 10*3/uL (ref 0.0–0.1)
BASOS PCT: 1 %
EOS PCT: 2 %
Eosinophils Absolute: 0.1 10*3/uL (ref 0.0–0.5)
HCT: 35.3 % (ref 34.8–46.6)
Hemoglobin: 11.8 g/dL (ref 11.6–15.9)
LYMPHS PCT: 20 %
Lymphs Abs: 1.2 10*3/uL (ref 0.9–3.3)
MCH: 32.1 pg (ref 25.1–34.0)
MCHC: 33.4 g/dL (ref 31.5–36.0)
MCV: 95.9 fL (ref 79.5–101.0)
Monocytes Absolute: 0.4 10*3/uL (ref 0.1–0.9)
Monocytes Relative: 7 %
Neutro Abs: 4.4 10*3/uL (ref 1.5–6.5)
Neutrophils Relative %: 70 %
PLATELETS: 251 10*3/uL (ref 145–400)
RBC: 3.68 MIL/uL — AB (ref 3.70–5.45)
RDW: 14.2 % (ref 11.2–14.5)
WBC: 6.1 10*3/uL (ref 3.9–10.3)

## 2017-07-11 LAB — CMP (CANCER CENTER ONLY)
ALBUMIN: 4.1 g/dL (ref 3.5–5.0)
ALT: 74 U/L — AB (ref 0–55)
AST: 41 U/L — ABNORMAL HIGH (ref 5–34)
Alkaline Phosphatase: 61 U/L (ref 40–150)
Anion gap: 11 (ref 3–11)
BUN: 12 mg/dL (ref 7–26)
CHLORIDE: 102 mmol/L (ref 98–109)
CO2: 25 mmol/L (ref 22–29)
CREATININE: 0.79 mg/dL (ref 0.60–1.10)
Calcium: 9.5 mg/dL (ref 8.4–10.4)
GFR, Est AFR Am: >60 mL/min (ref >60)
GFR, Estimated: >60 mL/min (ref >60)
GLUCOSE: 99 mg/dL (ref 70–140)
Potassium: 4.3 mmol/L (ref 3.5–5.1)
SODIUM: 138 mmol/L (ref 136–145)
Total Bilirubin: 0.3 mg/dL (ref 0.2–1.2)
Total Protein: 7 g/dL (ref 6.4–8.3)

## 2017-07-11 MED ORDER — FAMOTIDINE IN NACL 20-0.9 MG/50ML-% IV SOLN
INTRAVENOUS | Status: AC
  Filled 2017-07-11: qty 50

## 2017-07-11 MED ORDER — SODIUM CHLORIDE 0.9 % IV SOLN
65.0000 mg/m2 | Freq: Once | INTRAVENOUS | Status: AC
  Administered 2017-07-11: 138 mg via INTRAVENOUS
  Filled 2017-07-11: qty 23

## 2017-07-11 MED ORDER — FAMOTIDINE IN NACL 20-0.9 MG/50ML-% IV SOLN
20.0000 mg | Freq: Once | INTRAVENOUS | Status: AC
  Administered 2017-07-11: 20 mg via INTRAVENOUS

## 2017-07-11 MED ORDER — SODIUM CHLORIDE 0.9 % IV SOLN
Freq: Once | INTRAVENOUS | Status: AC
  Administered 2017-07-11: 11:00:00 via INTRAVENOUS

## 2017-07-11 MED ORDER — SODIUM CHLORIDE 0.9% FLUSH
10.0000 mL | INTRAVENOUS | Status: DC | PRN
  Administered 2017-07-11: 10 mL
  Filled 2017-07-11: qty 10

## 2017-07-11 MED ORDER — DEXAMETHASONE SODIUM PHOSPHATE 10 MG/ML IJ SOLN
4.0000 mg | Freq: Once | INTRAMUSCULAR | Status: AC
  Administered 2017-07-11: 4 mg via INTRAVENOUS

## 2017-07-11 MED ORDER — DEXAMETHASONE SODIUM PHOSPHATE 10 MG/ML IJ SOLN
INTRAMUSCULAR | Status: AC
  Filled 2017-07-11: qty 1

## 2017-07-11 MED ORDER — DIPHENHYDRAMINE HCL 50 MG/ML IJ SOLN
25.0000 mg | Freq: Once | INTRAMUSCULAR | Status: AC
  Administered 2017-07-11: 25 mg via INTRAVENOUS

## 2017-07-11 MED ORDER — DIPHENHYDRAMINE HCL 50 MG/ML IJ SOLN
INTRAMUSCULAR | Status: AC
  Filled 2017-07-11: qty 1

## 2017-07-11 MED ORDER — HEPARIN SOD (PORK) LOCK FLUSH 100 UNIT/ML IV SOLN
500.0000 [IU] | Freq: Once | INTRAVENOUS | Status: AC | PRN
  Administered 2017-07-11: 500 [IU]
  Filled 2017-07-11: qty 5

## 2017-07-11 NOTE — Progress Notes (Signed)
[place]  Telephone:([phone]) [phone] Fax:(336) [phone]     ID: Shelby Payne DOB: 01-17-1956  MR#: [id number]  NTI#:[id number]  Patient Care Team: Hoyt Koch, MD as PCP - General (Internal Medicine) Magrinat, Virgie Dad, MD as Consulting Physician (Oncology) Eppie Gibson, MD as Attending Physician (Radiation Oncology) Erroll Luna, MD as Consulting Physician (General Surgery) Christene Slates, MD as Physician Assistant (Radiology) OTHER MD:  CHIEF COMPLAINT: Estrogen receptor positive breast cancer  CURRENT TREATMENT: Adjuvant chemotherapy    HISTORY OF CURRENT ILLNESS: From the original intake note:  The patient had bilateral screening mammography at [facility] 12/28/2016.  There was a new mass in the right breast lower inner quadrant and some new grouped calcifications in the right breast same quadrant.  Accordingly the patient was called back for right diagnostic mammography and ultrasonography 01/09/2017.  The breast density was category A.  In the right breast lower inner quadrant there was a new irregular mass.  Again noted was a new group of heterogeneous calcifications in the same quadrant.  Ultrasound confirmed a 1.7 cm irregular mass in the right breast lower inner quadrant.  There were no abnormalities sonographically in the right axilla.  Taken together,  the mass plus calcifications measured up to 4 cm.  Accordingly on 01/11/2017 she underwent biopsy of the right breast mass in question, showing [id number]) invasive ductal carcinoma, grade 2, estrogen receptor 90% positive with strong staining intensity, progesterone receptor negative, with an Mib-1 of 20% and no HER-2 amplification, the signals ratio being 1.26 and the number per cell 2.20  The patient's subsequent history is as detailed below.  INTERVAL HISTORY: Shelby Payne returns today for follow up and treatment of her estrogen receptor positive breast cancer. Today is day 1 cycle 8 of 12 planned  weekly paclitaxel doses.  Shelby Payne is tolerating the chemotherapy relatively well.     REVIEW OF SYSTEMS: Shelby Payne is doing well today.  She has had occasional blood tinged nasal drainage about once per week that she has noticed.  She denies any "nose bleeds" or dripping blood from her nose.  She is doing well otherwise.  She denies headaches, vision changes, shortness of breath, cough, pain, palpitations, nausea, vomiting, constipation, diarrhea, peripheral neuropathy, or any other concerns.  A detailed ROS was otherwise non contributory today.   PAST MEDICAL HISTORY: Past Medical History:  Diagnosis Date  . Acute sinusitis, unspecified   . Anxiety   . Cancer ([place]) 12/2016   right breast cancer  . Depression   . Family history of prostate cancer   . Hypertension   . Obesity, unspecified   . Pneumonia, organism unspecified(486)     PAST SURGICAL HISTORY: Past Surgical History:  Procedure Laterality Date  . BREAST LUMPECTOMY WITH RADIOACTIVE SEED AND SENTINEL LYMPH NODE BIOPSY Right 01/25/2017   Procedure: RIGHT BREAST LUMPECTOMY  WITH 2 RADIOACTIVE SEEDS AND SENTINEL LYMPH NODE BIOPSY;  Surgeon: Erroll Luna, MD;  Location: [facility];  Service: General;  Laterality: Right;  . PORTACATH PLACEMENT Right 03/13/2017   Procedure: ULTRASOUND GUIDED INSERTION PORT-A-CATH RIGHT INTERNAL JUGULAR;  Surgeon: Erroll Luna, MD;  Location: [facility];  Service: General;  Laterality: Right;  . WISDOM TOOTH EXTRACTION      FAMILY HISTORY Family History  Problem Relation Age of Onset  . Hypertension Mother   . Hypothyroidism Mother   . Hyperlipidemia Mother   . Gout Mother   . Breast cancer Mother 64  . Heart disease Father   . Hypertension Father   .  Diabetes Father   . Hypothyroidism Father   . Prostate cancer Father        dx late 77s  . Hypothyroidism Sister   . Cancer Sister        angiosarcoma - mets  . Esophageal cancer Maternal Uncle   . Stroke Paternal Aunt   . Stroke  Paternal Uncle   . Heart attack Maternal Grandfather   . ALS Paternal Grandfather   . COPD Neg Hx   . Colon cancer Neg Hx   . Rectal cancer Neg Hx   . Stomach cancer Neg Hx   Her father passed away from a massive stroke at 18. Her mother passed away from complications of breast cancer at 45. She had been diagnosed age 67. The patient's sister had angiosarcoma at 95. There is no otherhistory of breast or ovarian cancer in this famiy. The patient is of Ashkenazi extraction  GYNECOLOGIC HISTORY:  Patient's last menstrual period was 07/12/2011.  Menarche age 33, first live birth age 79, she is GXP2. Menopause 2015, never used OCPs or HR   SOCIAL HISTORY:  She is a Electrical engineer at [employer]. Her husband, [name] was a Curator at [employer] in [place] law, but is retired now. Her daughter [name] teaches nursing at [employer]. [name] is concerned that on her father's side a cousin has tested positive for a deleterious gene; she will try to bring [name] that information]. Son, [name] passed away. The patient does not have any grandchildren. She attends [employer].     ADVANCED DIRECTIVES:    HEALTH MAINTENANCE: Social History   Tobacco Use  . Smoking status: Never Smoker  . Smokeless tobacco: Never Used  Substance Use Topics  . Alcohol use: Yes    Comment: rare glass of wine  . Drug use: No     Colonoscopy: 2013  PAP:  Bone density:never   Allergies  Allergen Reactions  . Hctz [Hydrochlorothiazide] Other (See Comments)    Hyponatremia  . Bactrim [Sulfamethoxazole-Trimethoprim] Rash    Current Outpatient Medications  Medication Sig Dispense Refill  . b complex vitamins tablet Take 1 tablet by mouth daily.    . Coenzyme Q10 (CO Q-10 PO) Take 1 capsule by mouth daily.     [name] Kitchen dexamethasone (DECADRON) 4 MG tablet Take 2 tablets by mouth once a day on the day after chemotherapy and then take 2 tablets two times a day for 2 days. Take with food. (Patient  taking differently: Take 8 mg by mouth See admin instructions. Take 8 mg by mouth once a day on the day after chemotherapy and then take 8 mg two times a day for 2 days. Take with food.) 30 tablet 1  . lidocaine-prilocaine (EMLA) cream Apply to affected area once 30 g 3  . LORazepam (ATIVAN) 0.5 MG tablet Take 1 tablet (0.5 mg total) by mouth at bedtime as needed (Nausea or vomiting). 30 tablet 0  . metoprolol succinate (TOPROL-XL) 100 MG 24 hr tablet Take 1 tablet (100 mg total) daily by mouth. 90 tablet 3  . Omega-3 Fatty Acids (FISH OIL PO) Take 1 capsule by mouth daily.     . ondansetron (ZOFRAN) 8 MG tablet Take 1 tablet (8 mg total) by mouth every 8 (eight) hours as needed for nausea or vomiting. Do not take within 3 days of chemotherapy dose. 20 tablet 0  . prochlorperazine (COMPAZINE) 10 MG tablet Take 1 tablet (10 mg total) by mouth every 6 (six) hours as needed (  Nausea or vomiting). 30 tablet 1  . sertraline (ZOLOFT) 100 MG tablet Take 1 tablet (100 mg total) daily by mouth. 90 tablet 3  . Telmisartan-Amlodipine 40-5 MG TABS Take 1 tablet daily by mouth. 90 tablet 3  . triamcinolone cream (KENALOG) 0.1 % Apply 1 application 2 (two) times daily topically. (Patient taking differently: Apply 1 application topically 2 (two) times daily as needed (for rash). ) 30 g 6   No current facility-administered medications for this visit.     OBJECTIVE:  Vitals:   07/11/17 1047  BP: 120/83  Pulse: 83  Resp: 18  Temp: 98 F (36.7 C)  SpO2: 98%     Body mass index is 41.35 kg/m.   Wt Readings from Last 3 Encounters:  07/11/17 240 lb 14.4 oz (109.3 kg)  07/04/17 242 lb 4 oz (109.9 kg)  06/27/17 241 lb 1.6 oz (109.4 kg)  ECOG FS:1 - Symptomatic but completely ambulatory GENERAL: Patient is a well appearing female in no acute distress HEENT:  Sclerae anicteric.  Oropharynx clear and moist. No ulcerations or evidence of oropharyngeal candidiasis. Neck is supple.  NODES:  No cervical,  supraclavicular, or axillary lymphadenopathy palpated.  BREAST EXAM:  Deferred. LUNGS:  Clear to auscultation bilaterally.  No wheezes or rhonchi. HEART:  Regular rate and rhythm. No murmur appreciated. ABDOMEN:  Soft, nontender.  Positive, normoactive bowel sounds. No organomegaly palpated. MSK:  No focal spinal tenderness to palpation. Full range of motion bilaterally in the upper extremities. EXTREMITIES:  No peripheral edema.   SKIN:  Clear with no obvious rashes or skin changes. No nail dyscrasia. NEURO:  Nonfocal. Well oriented.  Appropriate affect.      LAB RESULTS:  CMP     Component Value Date/Time   NA 138 07/04/2017 1014   NA 137 01/18/2017 1238   K 4.2 07/04/2017 1014   K 4.1 01/18/2017 1238   CL 103 07/04/2017 1014   CO2 23 07/04/2017 1014   CO2 22 01/18/2017 1238   GLUCOSE 114 (H) 07/04/2017 1014   GLUCOSE 91 01/18/2017 1238   BUN 17 07/04/2017 1014   BUN 19.5 01/18/2017 1238   CREATININE 0.71 07/04/2017 1014   CREATININE 0.76 06/27/2017 1016   CREATININE 0.7 01/18/2017 1238   CALCIUM 9.4 07/04/2017 1014   CALCIUM 9.8 01/18/2017 1238   PROT 7.3 07/04/2017 1014   PROT 7.9 01/18/2017 1238   ALBUMIN 4.2 07/04/2017 1014   ALBUMIN 4.3 01/18/2017 1238   AST 30 07/04/2017 1014   AST 37 (H) 06/27/2017 1016   AST 19 01/18/2017 1238   ALT 54 07/04/2017 1014   ALT 75 (H) 06/27/2017 1016   ALT 24 01/18/2017 1238   ALKPHOS 52 07/04/2017 1014   ALKPHOS 53 01/18/2017 1238   BILITOT 0.5 07/04/2017 1014   BILITOT 0.3 06/27/2017 1016   BILITOT 0.39 01/18/2017 1238   GFRNONAA >60 07/04/2017 1014   GFRNONAA >60 06/27/2017 1016   GFRAA >60 07/04/2017 1014   GFRAA >60 06/27/2017 1016    No results found for: TOTALPROTELP, ALBUMINELP, A1GS, A2GS, BETS, BETA2SER, GAMS, MSPIKE, SPEI  No results found for: KPAFRELGTCHN, LAMBDASER, KAPLAMBRATIO  Lab Results  Component Value Date   WBC 6.1 07/11/2017   NEUTROABS 4.4 07/11/2017   HGB 11.8 07/11/2017   HCT 35.3  07/11/2017   MCV 95.9 07/11/2017   PLT 251 07/11/2017    '@LASTCHEMISTRY'$ @  No results found for: LABCA2  No components found for: [id number]  No results for input(s): INR in the  last 168 hours.  No results found for: LABCA2  No results found for: OFB510  No results found for: CHE527  No results found for: POE423  No results found for: CA2729  No components found for: HGQUANT  No results found for: CEA1 / No results found for: CEA1   No results found for: AFPTUMOR  No results found for: CHROMOGRNA  No results found for: PSA1  Appointment on 07/11/2017  Component Date Value Ref Range Status  . WBC Count 07/11/2017 6.1  3.9 - 10.3 K/uL Final  . RBC 07/11/2017 3.68* 3.70 - 5.45 MIL/uL Final  . Hemoglobin 07/11/2017 11.8  11.6 - 15.9 g/dL Final  . HCT 07/11/2017 35.3  34.8 - 46.6 % Final  . MCV 07/11/2017 95.9  79.5 - 101.0 fL Final  . MCH 07/11/2017 32.1  25.1 - 34.0 pg Final  . MCHC 07/11/2017 33.4  31.5 - 36.0 g/dL Final  . RDW 07/11/2017 14.2  11.2 - 14.5 % Final  . Platelet Count 07/11/2017 251  145 - 400 K/uL Final  . Neutrophils Relative % 07/11/2017 70  % Final  . Neutro Abs 07/11/2017 4.4  1.5 - 6.5 K/uL Final  . Lymphocytes Relative 07/11/2017 20  % Final  . Lymphs Abs 07/11/2017 1.2  0.9 - 3.3 K/uL Final  . Monocytes Relative 07/11/2017 7  % Final  . Monocytes Absolute 07/11/2017 0.4  0.1 - 0.9 K/uL Final  . Eosinophils Relative 07/11/2017 2  % Final  . Eosinophils Absolute 07/11/2017 0.1  0.0 - 0.5 K/uL Final  . Basophils Relative 07/11/2017 1  % Final  . Basophils Absolute 07/11/2017 0.0  0.0 - 0.1 K/uL Final   Performed at [facility], [address] [place], [place] [address]    (this displays the last labs from the last 3 days)  No results found for: TOTALPROTELP, ALBUMINELP, A1GS, A2GS, BETS, BETA2SER, GAMS, MSPIKE, SPEI (this displays SPEP labs)  No results found for: KPAFRELGTCHN, LAMBDASER,  KAPLAMBRATIO (kappa/lambda light chains)  No results found for: HGBA, HGBA2QUANT, HGBFQUANT, HGBSQUAN (Hemoglobinopathy evaluation)   No results found for: LDH  No results found for: IRON, TIBC, IRONPCTSAT (Iron and TIBC)  No results found for: FERRITIN  Urinalysis    Component Value Date/Time   COLORURINE Yellow 12/29/2008 0809   APPEARANCEUR CLEAR 12/29/2008 0809   LABSPEC 1.020 12/29/2008 0809   PHURINE 6.0 12/29/2008 0809   GLUCOSEU NEGATIVE 12/29/2008 0809   BILIRUBINUR NEGATIVE 12/29/2008 0809   KETONESUR NEGATIVE 12/29/2008 0809   UROBILINOGEN 0.2 12/29/2008 0809   NITRITE NEGATIVE 12/29/2008 0809   LEUKOCYTESUR NEGATIVE 12/29/2008 0809     STUDIES: No results found.  ELIGIBLE FOR AVAILABLE RESEARCH PROTOCOL: UPBEAT, ASA: Referral placed 02/28/2017  ASSESSMENT: 62 y.o. [place] woman s/p right breast lower inner quadrant biopsy 01/11/2017 for a cT1c cN0, stage IA invasive ductal breast cancer, estrogen receptor positive, progesterone receptor negativem HER-2 not amplified, with an Mib-1 of 20%  (1) genetics testing 03/22/2017 through the Hereditary Gene Panel offered by [facility] found no deleterious mutations in APC, ATM, AXIN2, BARD1, BMPR1A, BRCA1, BRCA2, BRIP1, CDH1, CDK4, CDKN2A (p14ARF), CDKN2A (p16INK4a), CHEK2, CTNNA1, DICER1, EPCAM (Deletion/duplication testing only), GREM1 (promoter region deletion/duplication testing only), KIT, MEN1, MLH1, MSH2, MSH3, MSH6, MUTYH, NBN, NF1, NHTL1, PALB2, PDGFRA, PMS2, POLD1, POLE, PTEN, RAD50, RAD51C, RAD51D, SDHB, SDHC, SDHD, SMAD4, SMARCA4. STK11, TP53, TSC1, TSC2, and VHL.  The following genes were evaluated for sequence changes only: SDHA and HOXB13 c.251G>A variant only.    (  2) status post right lumpectomy and sentinel lymph node sampling 01/25/2017 for a pT1c pN0, stage Ia invasive ductal carcinoma, grade 2, with negative margins.  (3) Oncotype DX score of 42 predicts a 9-year risk of recurrence outside the breast of  30% if the patient's only systemic therapy is tamoxifen for 5 years.  It also predicts a significant chemotherapy benefit.  (4) adjuvant chemotherapy will consist of cyclophosphamide and doxorubicin in dose dense fashion x4 starting 03/14/2017, completed 05/09/2017, followed by weekly paclitaxel x12 started 05/23/2017  (a) cycle 3 doxorubicin /cyclophosphamide delayed 1 week because of poor tolerance  (5) adjuvant radiation to follow therapy  (6) antiestrogens to start at the completion of local treatment  PLAN:  Shelby Payne continues to tolerate adjuvant chemotherapy without difficulty.  She will proceed with cycle 8 of Paclitaxel today (so long as CMET is within parameters).  I reviewed her CBC with her in detail which is normal.  She is not having any signs of neuropathy.  I recommended she use saline nasal spray as needed for her occasional blood tinged nasal drainage.    Shelby Payne will continue to return weekly for labs and chemo, and will see myself or Dr. Jana Hakim every other week.  She knows to call for any other issues that may develop before her next appointment with [name].  A total of (20) minutes of face-to-face time was spent with this patient with greater than 50% of that time in counseling and care-coordination.   Wilber Bihari, NP  07/11/17 10:56 AM Medical Oncology and Hematology [facility] [address] [place], [place] [address] Tel. [phone]    Fax. [phone]

## 2017-07-11 NOTE — Patient Instructions (Signed)
[facility] Discharge Instructions for Patients Receiving Chemotherapy  Today you received the following chemotherapy agents:  Taxol.  To help prevent nausea and vomiting after your treatment, we encourage you to take your nausea medication as directed.   If you develop nausea and vomiting that is not controlled by your nausea medication, call the clinic.   BELOW ARE SYMPTOMS THAT SHOULD BE REPORTED IMMEDIATELY:  *FEVER GREATER THAN 100.5 F  *CHILLS WITH OR WITHOUT FEVER  NAUSEA AND VOMITING THAT IS NOT CONTROLLED WITH YOUR NAUSEA MEDICATION  *UNUSUAL SHORTNESS OF BREATH  *UNUSUAL BRUISING OR BLEEDING  TENDERNESS IN MOUTH AND THROAT WITH OR WITHOUT PRESENCE OF ULCERS  *URINARY PROBLEMS  *BOWEL PROBLEMS  UNUSUAL RASH Items with * indicate a potential emergency and should be followed up as soon as possible.  Feel free to call the clinic should you have any questions or concerns. The clinic phone number is [phone].  Please show the CHEMO ALERT CARD at check-in to the Emergency Department and triage nurse.   

## 2017-07-12 ENCOUNTER — Telehealth: Payer: Self-pay | Admitting: Adult Health

## 2017-07-12 NOTE — Telephone Encounter (Signed)
Per 6/4 no los °

## 2017-07-13 ENCOUNTER — Other Ambulatory Visit: Payer: Self-pay | Admitting: Oncology

## 2017-07-18 ENCOUNTER — Inpatient Hospital Stay: Payer: Self-pay

## 2017-07-18 VITALS — BP 128/81 | HR 74 | Temp 98.4°F | Resp 16

## 2017-07-18 DIAGNOSIS — Z170 Estrogen receptor positive status [ER+]: Principal | ICD-10-CM

## 2017-07-18 DIAGNOSIS — C50311 Malignant neoplasm of lower-inner quadrant of right female breast: Secondary | ICD-10-CM

## 2017-07-18 DIAGNOSIS — Z95828 Presence of other vascular implants and grafts: Secondary | ICD-10-CM

## 2017-07-18 LAB — CBC WITH DIFFERENTIAL (CANCER CENTER ONLY)
Basophils Absolute: 0 10*3/uL (ref 0.0–0.1)
Basophils Relative: 1 %
EOS ABS: 0.1 10*3/uL (ref 0.0–0.5)
EOS PCT: 2 %
HCT: 34.1 % — ABNORMAL LOW (ref 34.8–46.6)
Hemoglobin: 11.4 g/dL — ABNORMAL LOW (ref 11.6–15.9)
LYMPHS ABS: 1.4 10*3/uL (ref 0.9–3.3)
Lymphocytes Relative: 29 %
MCH: 31.9 pg (ref 25.1–34.0)
MCHC: 33.4 g/dL (ref 31.5–36.0)
MCV: 95.5 fL (ref 79.5–101.0)
MONO ABS: 0.3 10*3/uL (ref 0.1–0.9)
MONOS PCT: 6 %
Neutro Abs: 3 10*3/uL (ref 1.5–6.5)
Neutrophils Relative %: 62 %
PLATELETS: 221 10*3/uL (ref 145–400)
RBC: 3.57 MIL/uL — ABNORMAL LOW (ref 3.70–5.45)
RDW: 14.1 % (ref 11.2–14.5)
WBC Count: 4.7 10*3/uL (ref 3.9–10.3)

## 2017-07-18 LAB — CMP (CANCER CENTER ONLY)
ALT: 51 U/L (ref 0–55)
ANION GAP: 9 (ref 3–11)
AST: 24 U/L (ref 5–34)
Albumin: 3.9 g/dL (ref 3.5–5.0)
Alkaline Phosphatase: 58 U/L (ref 40–150)
BUN: 19 mg/dL (ref 7–26)
CHLORIDE: 105 mmol/L (ref 98–109)
CO2: 25 mmol/L (ref 22–29)
CREATININE: 0.7 mg/dL (ref 0.60–1.10)
Calcium: 9.5 mg/dL (ref 8.4–10.4)
Glucose, Bld: 96 mg/dL (ref 70–140)
Potassium: 4.2 mmol/L (ref 3.5–5.1)
Sodium: 139 mmol/L (ref 136–145)
Total Bilirubin: 0.3 mg/dL (ref 0.2–1.2)
Total Protein: 6.7 g/dL (ref 6.4–8.3)

## 2017-07-18 MED ORDER — DIPHENHYDRAMINE HCL 50 MG/ML IJ SOLN
INTRAMUSCULAR | Status: AC
  Filled 2017-07-18: qty 1

## 2017-07-18 MED ORDER — DEXAMETHASONE SODIUM PHOSPHATE 10 MG/ML IJ SOLN
INTRAMUSCULAR | Status: AC
  Filled 2017-07-18: qty 1

## 2017-07-18 MED ORDER — SODIUM CHLORIDE 0.9 % IV SOLN
65.0000 mg/m2 | Freq: Once | INTRAVENOUS | Status: AC
  Administered 2017-07-18: 138 mg via INTRAVENOUS
  Filled 2017-07-18: qty 23

## 2017-07-18 MED ORDER — DEXAMETHASONE SODIUM PHOSPHATE 10 MG/ML IJ SOLN
4.0000 mg | Freq: Once | INTRAMUSCULAR | Status: AC
  Administered 2017-07-18: 4 mg via INTRAVENOUS

## 2017-07-18 MED ORDER — FAMOTIDINE IN NACL 20-0.9 MG/50ML-% IV SOLN
INTRAVENOUS | Status: AC
  Filled 2017-07-18: qty 50

## 2017-07-18 MED ORDER — SODIUM CHLORIDE 0.9% FLUSH
10.0000 mL | INTRAVENOUS | Status: DC | PRN
  Administered 2017-07-18: 10 mL
  Filled 2017-07-18: qty 10

## 2017-07-18 MED ORDER — FAMOTIDINE IN NACL 20-0.9 MG/50ML-% IV SOLN
20.0000 mg | Freq: Once | INTRAVENOUS | Status: AC
  Administered 2017-07-18: 20 mg via INTRAVENOUS

## 2017-07-18 MED ORDER — HEPARIN SOD (PORK) LOCK FLUSH 100 UNIT/ML IV SOLN
500.0000 [IU] | Freq: Once | INTRAVENOUS | Status: AC | PRN
  Administered 2017-07-18: 500 [IU]
  Filled 2017-07-18: qty 5

## 2017-07-18 MED ORDER — SODIUM CHLORIDE 0.9 % IV SOLN
Freq: Once | INTRAVENOUS | Status: AC
  Administered 2017-07-18: 12:00:00 via INTRAVENOUS

## 2017-07-18 MED ORDER — DIPHENHYDRAMINE HCL 50 MG/ML IJ SOLN
25.0000 mg | Freq: Once | INTRAMUSCULAR | Status: AC
  Administered 2017-07-18: 25 mg via INTRAVENOUS

## 2017-07-18 NOTE — Patient Instructions (Signed)
[facility] Discharge Instructions for Patients Receiving Chemotherapy  Today you received the following chemotherapy agents:  Taxol.  To help prevent nausea and vomiting after your treatment, we encourage you to take your nausea medication as directed.   If you develop nausea and vomiting that is not controlled by your nausea medication, call the clinic.   BELOW ARE SYMPTOMS THAT SHOULD BE REPORTED IMMEDIATELY:  *FEVER GREATER THAN 100.5 F  *CHILLS WITH OR WITHOUT FEVER  NAUSEA AND VOMITING THAT IS NOT CONTROLLED WITH YOUR NAUSEA MEDICATION  *UNUSUAL SHORTNESS OF BREATH  *UNUSUAL BRUISING OR BLEEDING  TENDERNESS IN MOUTH AND THROAT WITH OR WITHOUT PRESENCE OF ULCERS  *URINARY PROBLEMS  *BOWEL PROBLEMS  UNUSUAL RASH Items with * indicate a potential emergency and should be followed up as soon as possible.  Feel free to call the clinic should you have any questions or concerns. The clinic phone number is [phone].  Please show the CHEMO ALERT CARD at check-in to the Emergency Department and triage nurse.   

## 2017-07-25 ENCOUNTER — Encounter: Payer: Self-pay | Admitting: Adult Health

## 2017-07-25 ENCOUNTER — Inpatient Hospital Stay: Payer: Self-pay

## 2017-07-25 ENCOUNTER — Inpatient Hospital Stay (HOSPITAL_BASED_OUTPATIENT_CLINIC_OR_DEPARTMENT_OTHER): Payer: Self-pay | Admitting: Adult Health

## 2017-07-25 VITALS — BP 129/80 | HR 78 | Temp 98.3°F | Resp 18 | Ht 64.0 in | Wt 244.6 lb

## 2017-07-25 DIAGNOSIS — C50311 Malignant neoplasm of lower-inner quadrant of right female breast: Secondary | ICD-10-CM

## 2017-07-25 DIAGNOSIS — Z170 Estrogen receptor positive status [ER+]: Secondary | ICD-10-CM

## 2017-07-25 DIAGNOSIS — Z95828 Presence of other vascular implants and grafts: Secondary | ICD-10-CM

## 2017-07-25 DIAGNOSIS — R5383 Other fatigue: Secondary | ICD-10-CM

## 2017-07-25 DIAGNOSIS — I10 Essential (primary) hypertension: Secondary | ICD-10-CM

## 2017-07-25 LAB — CBC WITH DIFFERENTIAL (CANCER CENTER ONLY)
Basophils Absolute: 0.1 10*3/uL (ref 0.0–0.1)
Basophils Relative: 1 %
Eosinophils Absolute: 0.1 10*3/uL (ref 0.0–0.5)
Eosinophils Relative: 1 %
HEMATOCRIT: 35 % (ref 34.8–46.6)
HEMOGLOBIN: 11.6 g/dL (ref 11.6–15.9)
LYMPHS ABS: 1.4 10*3/uL (ref 0.9–3.3)
Lymphocytes Relative: 28 %
MCH: 31.8 pg (ref 25.1–34.0)
MCHC: 33.3 g/dL (ref 31.5–36.0)
MCV: 95.7 fL (ref 79.5–101.0)
Monocytes Absolute: 0.4 10*3/uL (ref 0.1–0.9)
Monocytes Relative: 8 %
NEUTROS ABS: 3.2 10*3/uL (ref 1.5–6.5)
NEUTROS PCT: 62 %
Platelet Count: 254 10*3/uL (ref 145–400)
RBC: 3.65 MIL/uL — AB (ref 3.70–5.45)
RDW: 15 % — ABNORMAL HIGH (ref 11.2–14.5)
WBC: 5.2 10*3/uL (ref 3.9–10.3)

## 2017-07-25 LAB — CMP (CANCER CENTER ONLY)
ALT: 66 U/L — ABNORMAL HIGH (ref 0–55)
ANION GAP: 8 (ref 3–11)
AST: 30 U/L (ref 5–34)
Albumin: 3.9 g/dL (ref 3.5–5.0)
Alkaline Phosphatase: 59 U/L (ref 40–150)
BUN: 21 mg/dL (ref 7–26)
CHLORIDE: 105 mmol/L (ref 98–109)
CO2: 25 mmol/L (ref 22–29)
Calcium: 9.5 mg/dL (ref 8.4–10.4)
Creatinine: 0.74 mg/dL (ref 0.60–1.10)
GFR, Estimated: >60 mL/min (ref >60)
Glucose, Bld: 99 mg/dL (ref 70–140)
POTASSIUM: 4.3 mmol/L (ref 3.5–5.1)
SODIUM: 138 mmol/L (ref 136–145)
Total Bilirubin: 0.3 mg/dL (ref 0.2–1.2)
Total Protein: 6.7 g/dL (ref 6.4–8.3)

## 2017-07-25 MED ORDER — DEXAMETHASONE SODIUM PHOSPHATE 10 MG/ML IJ SOLN
INTRAMUSCULAR | Status: AC
  Filled 2017-07-25: qty 1

## 2017-07-25 MED ORDER — SODIUM CHLORIDE 0.9 % IV SOLN
65.0000 mg/m2 | Freq: Once | INTRAVENOUS | Status: AC
  Administered 2017-07-25: 138 mg via INTRAVENOUS
  Filled 2017-07-25: qty 23

## 2017-07-25 MED ORDER — SODIUM CHLORIDE 0.9% FLUSH
10.0000 mL | INTRAVENOUS | Status: DC | PRN
  Administered 2017-07-25: 10 mL
  Filled 2017-07-25: qty 10

## 2017-07-25 MED ORDER — DEXAMETHASONE SODIUM PHOSPHATE 10 MG/ML IJ SOLN
4.0000 mg | Freq: Once | INTRAMUSCULAR | Status: AC
  Administered 2017-07-25: 4 mg via INTRAVENOUS

## 2017-07-25 MED ORDER — HEPARIN SOD (PORK) LOCK FLUSH 100 UNIT/ML IV SOLN
500.0000 [IU] | Freq: Once | INTRAVENOUS | Status: AC | PRN
  Administered 2017-07-25: 500 [IU]
  Filled 2017-07-25: qty 5

## 2017-07-25 MED ORDER — FAMOTIDINE IN NACL 20-0.9 MG/50ML-% IV SOLN
INTRAVENOUS | Status: AC
  Filled 2017-07-25: qty 50

## 2017-07-25 MED ORDER — FAMOTIDINE IN NACL 20-0.9 MG/50ML-% IV SOLN
20.0000 mg | Freq: Once | INTRAVENOUS | Status: AC
  Administered 2017-07-25: 20 mg via INTRAVENOUS

## 2017-07-25 MED ORDER — SODIUM CHLORIDE 0.9 % IV SOLN
Freq: Once | INTRAVENOUS | Status: AC
  Administered 2017-07-25: 14:00:00 via INTRAVENOUS

## 2017-07-25 MED ORDER — DIPHENHYDRAMINE HCL 50 MG/ML IJ SOLN
25.0000 mg | Freq: Once | INTRAMUSCULAR | Status: AC
  Administered 2017-07-25: 25 mg via INTRAVENOUS

## 2017-07-25 MED ORDER — DIPHENHYDRAMINE HCL 50 MG/ML IJ SOLN
INTRAMUSCULAR | Status: AC
  Filled 2017-07-25: qty 1

## 2017-07-25 NOTE — Progress Notes (Signed)
[place]  [phone] [phone]     ID: Shelby Payne DOB: [birth year]  MR#: [id number]  TKP#:[id number]  Patient Care Team: Hoyt Koch, MD as PCP - General (Internal Medicine) Magrinat, Virgie Dad, MD as Consulting Physician (Oncology) Eppie Gibson, MD as Attending Physician (Radiation Oncology) Erroll Luna, MD as Consulting Physician (General Surgery) Christene Slates, MD as Physician Assistant (Radiology) OTHER MD:  CHIEF COMPLAINT: Estrogen receptor positive breast cancer  CURRENT TREATMENT: Adjuvant chemotherapy    HISTORY OF CURRENT ILLNESS: From the original intake note:  The patient had bilateral screening mammography at [facility] 12/28/2016.  There was a new mass in the right breast lower inner quadrant and some new grouped calcifications in the right breast same quadrant.  Accordingly the patient was called back for right diagnostic mammography and ultrasonography 01/09/2017.  The breast density was category A.  In the right breast lower inner quadrant there was a new irregular mass.  Again noted was a new group of heterogeneous calcifications in the same quadrant.  Ultrasound confirmed a 1.7 cm irregular mass in the right breast lower inner quadrant.  There were no abnormalities sonographically in the right axilla.  Taken together,  the mass plus calcifications measured up to 4 cm.  Accordingly on 01/11/2017 she underwent biopsy of the right breast mass in question, showing [phone]) invasive ductal carcinoma, grade 2, estrogen receptor 90% positive with strong staining intensity, progesterone receptor negative, with an Mib-1 of 20% and no HER-2 amplification, the signals ratio being 1.26 and the number per cell 2.20  The patient's subsequent history is as detailed below.  INTERVAL HISTORY: Shelby Payne returns today for follow up and treatment of her estrogen receptor positive breast cancer. Today is day 1 cycle 10 of 12 planned  weekly paclitaxel doses.  Shelby Payne is tolerating the chemotherapy relatively well.     REVIEW OF SYSTEMS: Shelby Payne is fatigued.  She has some intermittent loose stool from time to time.  It isn't particularly troublesome.  She continues to deny any peripheral neuropathy.  She is otherwise feeling well and is ready to be finished with chemotherapy.  She denies fevers, chills, headaches, vision issues, mucositis, dysphagia, chest pain, palpitations, dyspnea, cough, nausea, vomiting, constipation.  A detailed ROS was non contributory.     PAST MEDICAL HISTORY: Past Medical History:  Diagnosis Date  . Acute sinusitis, unspecified   . Anxiety   . Cancer ([place]) 12/2016   right breast cancer  . Depression   . Family history of prostate cancer   . Hypertension   . Obesity, unspecified   . Pneumonia, organism unspecified(486)     PAST SURGICAL HISTORY: Past Surgical History:  Procedure Laterality Date  . BREAST LUMPECTOMY WITH RADIOACTIVE SEED AND SENTINEL LYMPH NODE BIOPSY Right 01/25/2017   Procedure: RIGHT BREAST LUMPECTOMY  WITH 2 RADIOACTIVE SEEDS AND SENTINEL LYMPH NODE BIOPSY;  Surgeon: Erroll Luna, MD;  Location: [place];  Service: General;  Laterality: Right;  . PORTACATH PLACEMENT Right 03/13/2017   Procedure: ULTRASOUND GUIDED INSERTION PORT-A-CATH RIGHT INTERNAL JUGULAR;  Surgeon: Erroll Luna, MD;  Location: [place];  Service: General;  Laterality: Right;  . WISDOM TOOTH EXTRACTION      FAMILY HISTORY Family History  Problem Relation Age of Onset  . Hypertension Mother   . Hypothyroidism Mother   . Hyperlipidemia Mother   . Gout Mother   . Breast cancer Mother 71  . Heart disease Father   . Hypertension Father   .  Diabetes Father   . Hypothyroidism Father   . Prostate cancer Father        dx late 53s  . Hypothyroidism Sister   . Cancer Sister        angiosarcoma - mets  . Esophageal cancer Maternal Uncle   . Stroke Paternal Aunt   . Stroke Paternal  Uncle   . Heart attack Maternal Grandfather   . ALS Paternal Grandfather   . COPD Neg Hx   . Colon cancer Neg Hx   . Rectal cancer Neg Hx   . Stomach cancer Neg Hx   Her father passed away from a massive stroke at 31. Her mother passed away from complications of breast cancer at 61. She had been diagnosed age 61. The patient's sister had angiosarcoma at 55. There is no otherhistory of breast or ovarian cancer in this famiy. The patient is of Ashkenazi extraction  GYNECOLOGIC HISTORY:  Patient's last menstrual period was 07/12/2011.  Menarche age 12, first live birth age 62, she is GXP2. Menopause 2015, never used OCPs or HR   SOCIAL HISTORY:  She is a Electrical engineer at [employer]. Her husband, [name] was a Curator at [employer] in [place], but is retired now. Her daughter [name] teaches nursing at [employer]. [name] is concerned that on her father's side a cousin has tested positive for a deleterious gene; she will try to bring [name] that information]. Son, [name] passed away. The patient does not have any grandchildren. She attends [employer].     ADVANCED DIRECTIVES:    HEALTH MAINTENANCE: Social History   Tobacco Use  . Smoking status: Never Smoker  . Smokeless tobacco: Never Used  Substance Use Topics  . Alcohol use: Yes    Comment: rare glass of wine  . Drug use: No     Colonoscopy: 2013  PAP:  Bone density:never   Allergies  Allergen Reactions  . Hctz [Hydrochlorothiazide] Other (See Comments)    Hyponatremia  . Bactrim [Sulfamethoxazole-Trimethoprim] Rash    Current Outpatient Medications  Medication Sig Dispense Refill  . b complex vitamins tablet Take 1 tablet by mouth daily.    . Coenzyme Q10 (CO Q-10 PO) Take 1 capsule by mouth daily.     [name] Kitchen dexamethasone (DECADRON) 4 MG tablet Take 2 tablets by mouth once a day on the day after chemotherapy and then take 2 tablets two times a day for 2 days. Take with food. (Patient taking  differently: Take 8 mg by mouth See admin instructions. Take 8 mg by mouth once a day on the day after chemotherapy and then take 8 mg two times a day for 2 days. Take with food.) 30 tablet 1  . lidocaine-prilocaine (EMLA) cream Apply to affected area once 30 g 3  . LORazepam (ATIVAN) 0.5 MG tablet Take 1 tablet (0.5 mg total) by mouth at bedtime as needed (Nausea or vomiting). 30 tablet 0  . metoprolol succinate (TOPROL-XL) 100 MG 24 hr tablet Take 1 tablet (100 mg total) daily by mouth. 90 tablet 3  . Omega-3 Fatty Acids (FISH OIL PO) Take 1 capsule by mouth daily.     . ondansetron (ZOFRAN) 8 MG tablet TAKE 1 TABLET(8 MG) BY MOUTH EVERY 8 HOURS AS NEEDED FOR NAUSEA OR VOMITING. DO NOT TAKE WITHIN 3 DAYS OF CHEMOTHERAPY DOSE 20 tablet 0  . prochlorperazine (COMPAZINE) 10 MG tablet Take 1 tablet (10 mg total) by mouth every 6 (six) hours as needed (Nausea or vomiting).  30 tablet 1  . sertraline (ZOLOFT) 100 MG tablet Take 1 tablet (100 mg total) daily by mouth. 90 tablet 3  . Telmisartan-Amlodipine 40-5 MG TABS Take 1 tablet daily by mouth. 90 tablet 3  . triamcinolone cream (KENALOG) 0.1 % Apply 1 application 2 (two) times daily topically. (Patient taking differently: Apply 1 application topically 2 (two) times daily as needed (for rash). ) 30 g 6   No current facility-administered medications for this visit.     OBJECTIVE:  Vitals:   07/25/17 1156  BP: 129/80  Pulse: 78  Resp: 18  Temp: 98.3 F (36.8 C)  SpO2: 97%     Body mass index is 41.99 kg/m.   Wt Readings from Last 3 Encounters:  07/25/17 244 lb 9.6 oz (110.9 kg)  07/11/17 240 lb 14.4 oz (109.3 kg)  07/04/17 242 lb 4 oz (109.9 kg)  ECOG FS:1 - Symptomatic but completely ambulatory GENERAL: Patient is a well appearing female in no acute distress HEENT:  Sclerae anicteric.  Oropharynx clear and moist. No ulcerations or evidence of oropharyngeal candidiasis. Neck is supple.  NODES:  No cervical, supraclavicular, or axillary  lymphadenopathy palpated.  BREAST EXAM:  Deferred. LUNGS:  Clear to auscultation bilaterally.  No wheezes or rhonchi. HEART:  Regular rate and rhythm. No murmur appreciated. ABDOMEN:  Soft, nontender.  Positive, normoactive bowel sounds. No organomegaly palpated. MSK:  No focal spinal tenderness to palpation. Full range of motion bilaterally in the upper extremities. EXTREMITIES:  No peripheral edema.   SKIN:  Clear with no obvious rashes or skin changes. No nail dyscrasia. NEURO:  Nonfocal. Well oriented.  Appropriate affect.      LAB RESULTS:  CMP     Component Value Date/Time   NA 139 07/18/2017 1011   NA 137 01/18/2017 1238   K 4.2 07/18/2017 1011   K 4.1 01/18/2017 1238   CL 105 07/18/2017 1011   CO2 25 07/18/2017 1011   CO2 22 01/18/2017 1238   GLUCOSE 96 07/18/2017 1011   GLUCOSE 91 01/18/2017 1238   BUN 19 07/18/2017 1011   BUN 19.5 01/18/2017 1238   CREATININE 0.70 07/18/2017 1011   CREATININE 0.7 01/18/2017 1238   CALCIUM 9.5 07/18/2017 1011   CALCIUM 9.8 01/18/2017 1238   PROT 6.7 07/18/2017 1011   PROT 7.9 01/18/2017 1238   ALBUMIN 3.9 07/18/2017 1011   ALBUMIN 4.3 01/18/2017 1238   AST 24 07/18/2017 1011   AST 19 01/18/2017 1238   ALT 51 07/18/2017 1011   ALT 24 01/18/2017 1238   ALKPHOS 58 07/18/2017 1011   ALKPHOS 53 01/18/2017 1238   BILITOT 0.3 07/18/2017 1011   BILITOT 0.39 01/18/2017 1238   GFRNONAA >60 07/18/2017 1011   GFRAA >60 07/18/2017 1011    No results found for: TOTALPROTELP, ALBUMINELP, A1GS, A2GS, BETS, BETA2SER, GAMS, MSPIKE, SPEI  No results found for: KPAFRELGTCHN, LAMBDASER, KAPLAMBRATIO  Lab Results  Component Value Date   WBC 5.2 07/25/2017   NEUTROABS 3.2 07/25/2017   HGB 11.6 07/25/2017   HCT 35.0 07/25/2017   MCV 95.7 07/25/2017   PLT 254 07/25/2017    '@LASTCHEMISTRY'$ @  No results found for: LABCA2  No components found for: WIOXBD532  No results for input(s): INR in the last 168 hours.  No results found for:  LABCA2  No results found for: DJM426  No results found for: STM196  No results found for: QIW979  No results found for: CA2729  No components found for: HGQUANT  No results  found for: CEA1 / No results found for: CEA1   No results found for: AFPTUMOR  No results found for: [place]  No results found for: PSA1  Appointment on 07/25/2017  Component Date Value Ref Range Status  . WBC Count 07/25/2017 5.2  3.9 - 10.3 K/uL Final  . RBC 07/25/2017 3.65* 3.70 - 5.45 MIL/uL Final  . Hemoglobin 07/25/2017 11.6  11.6 - 15.9 g/dL Final  . HCT 07/25/2017 35.0  34.8 - 46.6 % Final  . MCV 07/25/2017 95.7  79.5 - 101.0 fL Final  . MCH 07/25/2017 31.8  25.1 - 34.0 pg Final  . MCHC 07/25/2017 33.3  31.5 - 36.0 g/dL Final  . RDW 07/25/2017 15.0* 11.2 - 14.5 % Final  . Platelet Count 07/25/2017 254  145 - 400 K/uL Final  . Neutrophils Relative % 07/25/2017 62  % Final  . Neutro Abs 07/25/2017 3.2  1.5 - 6.5 K/uL Final  . Lymphocytes Relative 07/25/2017 28  % Final  . Lymphs Abs 07/25/2017 1.4  0.9 - 3.3 K/uL Final  . Monocytes Relative 07/25/2017 8  % Final  . Monocytes Absolute 07/25/2017 0.4  0.1 - 0.9 K/uL Final  . Eosinophils Relative 07/25/2017 1  % Final  . Eosinophils Absolute 07/25/2017 0.1  0.0 - 0.5 K/uL Final  . Basophils Relative 07/25/2017 1  % Final  . Basophils Absolute 07/25/2017 0.1  0.0 - 0.1 K/uL Final   Performed at [facility], [address] [place], [place] [address]    (this displays the last labs from the last 3 days)  No results found for: TOTALPROTELP, ALBUMINELP, A1GS, A2GS, BETS, BETA2SER, GAMS, MSPIKE, SPEI (this displays SPEP labs)  No results found for: KPAFRELGTCHN, LAMBDASER, KAPLAMBRATIO (kappa/lambda light chains)  No results found for: HGBA, HGBA2QUANT, HGBFQUANT, HGBSQUAN (Hemoglobinopathy evaluation)   No results found for: LDH  No results found for: IRON, TIBC, IRONPCTSAT (Iron and TIBC)  No results found  for: FERRITIN  Urinalysis    Component Value Date/Time   COLORURINE Yellow 12/29/2008 0809   APPEARANCEUR CLEAR 12/29/2008 0809   LABSPEC 1.020 12/29/2008 0809   PHURINE 6.0 12/29/2008 0809   GLUCOSEU NEGATIVE 12/29/2008 0809   BILIRUBINUR NEGATIVE 12/29/2008 0809   KETONESUR NEGATIVE 12/29/2008 0809   UROBILINOGEN 0.2 12/29/2008 0809   NITRITE NEGATIVE 12/29/2008 0809   LEUKOCYTESUR NEGATIVE 12/29/2008 0809     STUDIES: No results found.  ELIGIBLE FOR AVAILABLE RESEARCH PROTOCOL: UPBEAT, ASA: Referral placed 02/28/2017  ASSESSMENT: 63 y.o. [place] woman s/p right breast lower inner quadrant biopsy 01/11/2017 for a cT1c cN0, stage IA invasive ductal breast cancer, estrogen receptor positive, progesterone receptor negativem HER-2 not amplified, with an Mib-1 of 20%  (1) genetics testing 03/22/2017 through the Hereditary Gene Panel offered by [facility] found no deleterious mutations in APC, ATM, AXIN2, BARD1, BMPR1A, BRCA1, BRCA2, BRIP1, CDH1, CDK4, CDKN2A (p14ARF), CDKN2A (p16INK4a), CHEK2, CTNNA1, DICER1, EPCAM (Deletion/duplication testing only), GREM1 (promoter region deletion/duplication testing only), KIT, MEN1, MLH1, MSH2, MSH3, MSH6, MUTYH, NBN, NF1, NHTL1, PALB2, PDGFRA, PMS2, POLD1, POLE, PTEN, RAD50, RAD51C, RAD51D, SDHB, SDHC, SDHD, SMAD4, SMARCA4. STK11, TP53, TSC1, TSC2, and VHL.  The following genes were evaluated for sequence changes only: SDHA and HOXB13 c.251G>A variant only.    (2) status post right lumpectomy and sentinel lymph node sampling 01/25/2017 for a pT1c pN0, stage Ia invasive ductal carcinoma, grade 2, with negative margins.  (3) Oncotype DX score of 42 predicts a 9-year risk of recurrence outside the breast of 30%  if the patient's only systemic therapy is tamoxifen for 5 years.  It also predicts a significant chemotherapy benefit.  (4) adjuvant chemotherapy will consist of cyclophosphamide and doxorubicin in dose dense fashion x4 starting 03/14/2017,  completed 05/09/2017, followed by weekly paclitaxel x12 started 05/23/2017  (a) cycle 3 doxorubicin /cyclophosphamide delayed 1 week because of poor tolerance  (5) adjuvant radiation to follow therapy  (6) antiestrogens to start at the completion of local treatment  PLAN:  Madalina is doing well today and continues to tolerate Paclitaxel well with the exception of fatigue.  I reviewed her CBC with her in detail.  She can proceed with week 10 of Paclitaxel (so long as her CMET is within parameters).  She and I reviewed that she will see radiation oncology after her chemotherapy completion.    Jaylin will continue to return weekly for labs and chemo, and will see myself or Dr. Jana Hakim every other week.  She knows to call for any other issues that may develop before her next appointment with [name].  A total of (20) minutes of face-to-face time was spent with this patient with greater than 50% of that time in counseling and care-coordination.   Wilber Bihari, NP  07/25/17 12:05 PM Medical Oncology and Hematology [facility] [address] [place], [place] [address] Tel. [phone]    Fax. [phone]

## 2017-07-25 NOTE — Patient Instructions (Signed)
[facility] Discharge Instructions for Patients Receiving Chemotherapy  Today you received the following chemotherapy agents :  Taxol.  To help prevent nausea and vomiting after your treatment, we encourage you to take your nausea medication as prescribed.   If you develop nausea and vomiting that is not controlled by your nausea medication, call the clinic.   BELOW ARE SYMPTOMS THAT SHOULD BE REPORTED IMMEDIATELY:  *FEVER GREATER THAN 100.5 F  *CHILLS WITH OR WITHOUT FEVER  NAUSEA AND VOMITING THAT IS NOT CONTROLLED WITH YOUR NAUSEA MEDICATION  *UNUSUAL SHORTNESS OF BREATH  *UNUSUAL BRUISING OR BLEEDING  TENDERNESS IN MOUTH AND THROAT WITH OR WITHOUT PRESENCE OF ULCERS  *URINARY PROBLEMS  *BOWEL PROBLEMS  UNUSUAL RASH Items with * indicate a potential emergency and should be followed up as soon as possible.  Feel free to call the clinic should you have any questions or concerns. The clinic phone number is [phone].  Please show the CHEMO ALERT CARD at check-in to the Emergency Department and triage nurse.   

## 2017-07-25 NOTE — Patient Instructions (Signed)
Implanted Port Home Guide An implanted port is a type of central line that is placed under the skin. Central lines are used to provide IV access when treatment or nutrition needs to be given through a person's veins. Implanted ports are used for long-term IV access. An implanted port may be placed because:  You need IV medicine that would be irritating to the small veins in your hands or arms.  You need long-term IV medicines, such as antibiotics.  You need IV nutrition for a long period.  You need frequent blood draws for lab tests.  You need dialysis.  Implanted ports are usually placed in the chest area, but they can also be placed in the upper arm, the abdomen, or the leg. An implanted port has two main parts:  Reservoir. The reservoir is round and will appear as a small, raised area under your skin. The reservoir is the part where a needle is inserted to give medicines or draw blood.  Catheter. The catheter is a thin, flexible tube that extends from the reservoir. The catheter is placed into a large vein. Medicine that is inserted into the reservoir goes into the catheter and then into the vein.  How will I care for my incision site? Do not get the incision site wet. Bathe or shower as directed by your health care provider. How is my port accessed? Special steps must be taken to access the port:  Before the port is accessed, a numbing cream can be placed on the skin. This helps numb the skin over the port site.  Your health care provider uses a sterile technique to access the port. ? Your health care provider must put on a mask and sterile gloves. ? The skin over your port is cleaned carefully with an antiseptic and allowed to dry. ? The port is gently pinched between sterile gloves, and a needle is inserted into the port.  Only "non-coring" port needles should be used to access the port. Once the port is accessed, a blood return should be checked. This helps ensure that the port  is in the vein and is not clogged.  If your port needs to remain accessed for a constant infusion, a clear (transparent) bandage will be placed over the needle site. The bandage and needle will need to be changed every week, or as directed by your health care provider.  Keep the bandage covering the needle clean and dry. Do not get it wet. Follow your health care provider's instructions on how to take a shower or bath while the port is accessed.  If your port does not need to stay accessed, no bandage is needed over the port.  What is flushing? Flushing helps keep the port from getting clogged. Follow your health care provider's instructions on how and when to flush the port. Ports are usually flushed with saline solution or a medicine called heparin. The need for flushing will depend on how the port is used.  If the port is used for intermittent medicines or blood draws, the port will need to be flushed: ? After medicines have been given. ? After blood has been drawn. ? As part of routine maintenance.  If a constant infusion is running, the port may not need to be flushed.  How long will my port stay implanted? The port can stay in for as long as your health care provider thinks it is needed. When it is time for the port to come out, surgery will be   done to remove it. The procedure is similar to the one performed when the port was put in. When should I seek immediate medical care? When you have an implanted port, you should seek immediate medical care if:  You notice a bad smell coming from the incision site.  You have swelling, redness, or drainage at the incision site.  You have more swelling or pain at the port site or the surrounding area.  You have a fever that is not controlled with medicine.  This information is not intended to replace advice given to you by your health care provider. Make sure you discuss any questions you have with your health care provider. Document  Released: 01/24/2005 Document Revised: 07/02/2015 Document Reviewed: 10/01/2012 Elsevier Interactive Patient Education  2017 [employer]vier Inc.  

## 2017-07-26 ENCOUNTER — Telehealth: Payer: Self-pay | Admitting: Adult Health

## 2017-07-26 NOTE — Telephone Encounter (Signed)
Per 6/18 no los °

## 2017-08-01 ENCOUNTER — Inpatient Hospital Stay: Payer: Self-pay

## 2017-08-01 VITALS — BP 133/82 | HR 74 | Temp 98.5°F | Resp 17

## 2017-08-01 DIAGNOSIS — C50311 Malignant neoplasm of lower-inner quadrant of right female breast: Secondary | ICD-10-CM

## 2017-08-01 DIAGNOSIS — Z170 Estrogen receptor positive status [ER+]: Principal | ICD-10-CM

## 2017-08-01 LAB — CMP (CANCER CENTER ONLY)
ALK PHOS: 60 U/L (ref 38–126)
ALT: 68 U/L — AB (ref 0–44)
AST: 30 U/L (ref 15–41)
Albumin: 4.1 g/dL (ref 3.5–5.0)
Anion gap: 11 (ref 5–15)
BILIRUBIN TOTAL: 0.2 mg/dL — AB (ref 0.3–1.2)
BUN: 17 mg/dL (ref 6–20)
CALCIUM: 9.6 mg/dL (ref 8.9–10.3)
CO2: 23 mmol/L (ref 22–32)
CREATININE: 0.71 mg/dL (ref 0.44–1.00)
Chloride: 103 mmol/L (ref 98–111)
Glucose, Bld: 97 mg/dL (ref 70–99)
Potassium: 4.6 mmol/L (ref 3.5–5.1)
Sodium: 137 mmol/L (ref 135–145)
TOTAL PROTEIN: 7 g/dL (ref 6.5–8.1)

## 2017-08-01 LAB — CBC WITH DIFFERENTIAL (CANCER CENTER ONLY)
BASOS ABS: 0.1 10*3/uL (ref 0.0–0.1)
Basophils Relative: 1 %
EOS ABS: 0.1 10*3/uL (ref 0.0–0.5)
Eosinophils Relative: 1 %
HCT: 35.8 % (ref 34.8–46.6)
Hemoglobin: 12.2 g/dL (ref 11.6–15.9)
Lymphocytes Relative: 22 %
Lymphs Abs: 1.4 10*3/uL (ref 0.9–3.3)
MCH: 32.1 pg (ref 25.1–34.0)
MCHC: 33.9 g/dL (ref 31.5–36.0)
MCV: 94.7 fL (ref 79.5–101.0)
Monocytes Absolute: 0.5 10*3/uL (ref 0.1–0.9)
Monocytes Relative: 8 %
Neutro Abs: 4.2 10*3/uL (ref 1.5–6.5)
Neutrophils Relative %: 68 %
PLATELETS: 273 10*3/uL (ref 145–400)
RBC: 3.79 MIL/uL (ref 3.70–5.45)
RDW: 14.8 % — AB (ref 11.2–14.5)
WBC: 6.2 10*3/uL (ref 3.9–10.3)

## 2017-08-01 MED ORDER — SODIUM CHLORIDE 0.9 % IV SOLN
65.0000 mg/m2 | Freq: Once | INTRAVENOUS | Status: AC
  Administered 2017-08-01: 138 mg via INTRAVENOUS
  Filled 2017-08-01: qty 23

## 2017-08-01 MED ORDER — HEPARIN SOD (PORK) LOCK FLUSH 100 UNIT/ML IV SOLN
500.0000 [IU] | Freq: Once | INTRAVENOUS | Status: AC | PRN
  Administered 2017-08-01: 500 [IU]
  Filled 2017-08-01: qty 5

## 2017-08-01 MED ORDER — FAMOTIDINE IN NACL 20-0.9 MG/50ML-% IV SOLN
20.0000 mg | Freq: Once | INTRAVENOUS | Status: AC
  Administered 2017-08-01: 20 mg via INTRAVENOUS

## 2017-08-01 MED ORDER — SODIUM CHLORIDE 0.9% FLUSH
10.0000 mL | INTRAVENOUS | Status: DC | PRN
  Administered 2017-08-01: 10 mL
  Filled 2017-08-01: qty 10

## 2017-08-01 MED ORDER — DEXAMETHASONE SODIUM PHOSPHATE 10 MG/ML IJ SOLN
INTRAMUSCULAR | Status: AC
  Filled 2017-08-01: qty 1

## 2017-08-01 MED ORDER — SODIUM CHLORIDE 0.9 % IV SOLN
Freq: Once | INTRAVENOUS | Status: AC
  Administered 2017-08-01: 12:00:00 via INTRAVENOUS

## 2017-08-01 MED ORDER — DEXAMETHASONE SODIUM PHOSPHATE 10 MG/ML IJ SOLN
4.0000 mg | Freq: Once | INTRAMUSCULAR | Status: AC
  Administered 2017-08-01: 4 mg via INTRAVENOUS

## 2017-08-01 MED ORDER — DIPHENHYDRAMINE HCL 50 MG/ML IJ SOLN
25.0000 mg | Freq: Once | INTRAMUSCULAR | Status: AC
  Administered 2017-08-01: 25 mg via INTRAVENOUS

## 2017-08-01 MED ORDER — DIPHENHYDRAMINE HCL 50 MG/ML IJ SOLN
INTRAMUSCULAR | Status: AC
  Filled 2017-08-01: qty 1

## 2017-08-01 MED ORDER — FAMOTIDINE IN NACL 20-0.9 MG/50ML-% IV SOLN
INTRAVENOUS | Status: AC
  Filled 2017-08-01: qty 50

## 2017-08-01 NOTE — Patient Instructions (Signed)

## 2017-08-08 ENCOUNTER — Inpatient Hospital Stay: Payer: BC Managed Care – PPO

## 2017-08-08 ENCOUNTER — Encounter: Payer: Self-pay | Admitting: *Deleted

## 2017-08-08 ENCOUNTER — Inpatient Hospital Stay: Payer: BC Managed Care – PPO | Attending: Oncology

## 2017-08-08 ENCOUNTER — Inpatient Hospital Stay (HOSPITAL_BASED_OUTPATIENT_CLINIC_OR_DEPARTMENT_OTHER): Payer: BC Managed Care – PPO | Admitting: Adult Health

## 2017-08-08 ENCOUNTER — Encounter: Payer: Self-pay | Admitting: Adult Health

## 2017-08-08 ENCOUNTER — Telehealth: Payer: Self-pay | Admitting: Adult Health

## 2017-08-08 VITALS — BP 131/80 | HR 82 | Temp 98.3°F | Resp 18 | Ht 64.0 in | Wt 247.3 lb

## 2017-08-08 DIAGNOSIS — C50311 Malignant neoplasm of lower-inner quadrant of right female breast: Secondary | ICD-10-CM

## 2017-08-08 DIAGNOSIS — Z170 Estrogen receptor positive status [ER+]: Secondary | ICD-10-CM

## 2017-08-08 DIAGNOSIS — Z5111 Encounter for antineoplastic chemotherapy: Secondary | ICD-10-CM | POA: Insufficient documentation

## 2017-08-08 DIAGNOSIS — E669 Obesity, unspecified: Secondary | ICD-10-CM

## 2017-08-08 DIAGNOSIS — F329 Major depressive disorder, single episode, unspecified: Secondary | ICD-10-CM | POA: Diagnosis not present

## 2017-08-08 DIAGNOSIS — I10 Essential (primary) hypertension: Secondary | ICD-10-CM | POA: Insufficient documentation

## 2017-08-08 DIAGNOSIS — Z95828 Presence of other vascular implants and grafts: Secondary | ICD-10-CM

## 2017-08-08 DIAGNOSIS — F419 Anxiety disorder, unspecified: Secondary | ICD-10-CM | POA: Insufficient documentation

## 2017-08-08 DIAGNOSIS — Z79899 Other long term (current) drug therapy: Secondary | ICD-10-CM

## 2017-08-08 LAB — CBC WITH DIFFERENTIAL (CANCER CENTER ONLY)
BASOS PCT: 1 %
Basophils Absolute: 0.1 10*3/uL (ref 0.0–0.1)
EOS PCT: 2 %
Eosinophils Absolute: 0.1 10*3/uL (ref 0.0–0.5)
HCT: 34.9 % (ref 34.8–46.6)
Hemoglobin: 11.9 g/dL (ref 11.6–15.9)
LYMPHS PCT: 23 %
Lymphs Abs: 1.3 10*3/uL (ref 0.9–3.3)
MCH: 32.2 pg (ref 25.1–34.0)
MCHC: 33.9 g/dL (ref 31.5–36.0)
MCV: 95 fL (ref 79.5–101.0)
Monocytes Absolute: 0.4 10*3/uL (ref 0.1–0.9)
Monocytes Relative: 7 %
NEUTROS ABS: 3.7 10*3/uL (ref 1.5–6.5)
NEUTROS PCT: 67 %
PLATELETS: 264 10*3/uL (ref 145–400)
RBC: 3.68 MIL/uL — AB (ref 3.70–5.45)
RDW: 14.8 % — ABNORMAL HIGH (ref 11.2–14.5)
WBC: 5.5 10*3/uL (ref 3.9–10.3)

## 2017-08-08 LAB — CMP (CANCER CENTER ONLY)
ALT: 56 U/L — ABNORMAL HIGH (ref 0–44)
ANION GAP: 7 (ref 5–15)
AST: 25 U/L (ref 15–41)
Albumin: 4.1 g/dL (ref 3.5–5.0)
Alkaline Phosphatase: 59 U/L (ref 38–126)
BUN: 17 mg/dL (ref 6–20)
CHLORIDE: 105 mmol/L (ref 98–111)
CO2: 25 mmol/L (ref 22–32)
Calcium: 9.7 mg/dL (ref 8.9–10.3)
Creatinine: 0.71 mg/dL (ref 0.44–1.00)
GFR, Est AFR Am: >60 mL/min (ref >60)
Glucose, Bld: 103 mg/dL — ABNORMAL HIGH (ref 70–99)
Potassium: 4.5 mmol/L (ref 3.5–5.1)
Sodium: 137 mmol/L (ref 135–145)
Total Bilirubin: 0.2 mg/dL — ABNORMAL LOW (ref 0.3–1.2)
Total Protein: 6.9 g/dL (ref 6.5–8.1)

## 2017-08-08 MED ORDER — DIPHENHYDRAMINE HCL 50 MG/ML IJ SOLN
INTRAMUSCULAR | Status: AC
Start: 2017-08-08 — End: ?
  Filled 2017-08-08: qty 1

## 2017-08-08 MED ORDER — DEXAMETHASONE SODIUM PHOSPHATE 10 MG/ML IJ SOLN
4.0000 mg | Freq: Once | INTRAMUSCULAR | Status: AC
  Administered 2017-08-08: 4 mg via INTRAVENOUS

## 2017-08-08 MED ORDER — FAMOTIDINE IN NACL 20-0.9 MG/50ML-% IV SOLN
20.0000 mg | Freq: Once | INTRAVENOUS | Status: AC
  Administered 2017-08-08: 20 mg via INTRAVENOUS

## 2017-08-08 MED ORDER — DEXAMETHASONE SODIUM PHOSPHATE 10 MG/ML IJ SOLN
INTRAMUSCULAR | Status: AC
Start: 2017-08-08 — End: ?
  Filled 2017-08-08: qty 1

## 2017-08-08 MED ORDER — HEPARIN SOD (PORK) LOCK FLUSH 100 UNIT/ML IV SOLN
500.0000 [IU] | Freq: Once | INTRAVENOUS | Status: AC | PRN
  Administered 2017-08-08: 500 [IU]
  Filled 2017-08-08: qty 5

## 2017-08-08 MED ORDER — SODIUM CHLORIDE 0.9% FLUSH
10.0000 mL | INTRAVENOUS | Status: DC | PRN
  Administered 2017-08-08: 10 mL
  Filled 2017-08-08: qty 10

## 2017-08-08 MED ORDER — FAMOTIDINE IN NACL 20-0.9 MG/50ML-% IV SOLN
INTRAVENOUS | Status: AC
Start: 2017-08-08 — End: ?
  Filled 2017-08-08: qty 50

## 2017-08-08 MED ORDER — SODIUM CHLORIDE 0.9 % IV SOLN
Freq: Once | INTRAVENOUS | Status: AC
  Administered 2017-08-08: 13:00:00 via INTRAVENOUS

## 2017-08-08 MED ORDER — DIPHENHYDRAMINE HCL 50 MG/ML IJ SOLN
25.0000 mg | Freq: Once | INTRAMUSCULAR | Status: AC
Start: 2017-08-08 — End: 2017-08-08
  Administered 2017-08-08: 25 mg via INTRAVENOUS

## 2017-08-08 MED ORDER — SODIUM CHLORIDE 0.9 % IV SOLN
65.0000 mg/m2 | Freq: Once | INTRAVENOUS | Status: AC
  Administered 2017-08-08: 138 mg via INTRAVENOUS
  Filled 2017-08-08: qty 23

## 2017-08-08 NOTE — Telephone Encounter (Signed)
Scheduled appt per 7/2 los - gave patient aVS and calender per los.  

## 2017-08-08 NOTE — Patient Instructions (Signed)
[facility] Discharge Instructions for Patients Receiving Chemotherapy  Today you received the following chemotherapy agents :  Taxol.  To help prevent nausea and vomiting after your treatment, we encourage you to take your nausea medication as prescribed.   If you develop nausea and vomiting that is not controlled by your nausea medication, call the clinic.   BELOW ARE SYMPTOMS THAT SHOULD BE REPORTED IMMEDIATELY:  *FEVER GREATER THAN 100.5 F  *CHILLS WITH OR WITHOUT FEVER  NAUSEA AND VOMITING THAT IS NOT CONTROLLED WITH YOUR NAUSEA MEDICATION  *UNUSUAL SHORTNESS OF BREATH  *UNUSUAL BRUISING OR BLEEDING  TENDERNESS IN MOUTH AND THROAT WITH OR WITHOUT PRESENCE OF ULCERS  *URINARY PROBLEMS  *BOWEL PROBLEMS  UNUSUAL RASH Items with * indicate a potential emergency and should be followed up as soon as possible.  Feel free to call the clinic should you have any questions or concerns. The clinic phone number is [phone].  Please show the CHEMO ALERT CARD at check-in to the Emergency Department and triage nurse.   

## 2017-08-08 NOTE — Progress Notes (Addendum)
[place]  [phone] Fax:([phone]     ID: COLLIER MONICA DOB: 23-Apr-1955  MR#: [id number]  ZHY#:[id number]  Patient Care Team: Hoyt Koch, MD as PCP - General (Internal Medicine) Magrinat, Virgie Dad, MD as Consulting Physician (Oncology) Eppie Gibson, MD as Attending Physician (Radiation Oncology) Erroll Luna, MD as Consulting Physician (General Surgery) Christene Slates, MD as Physician Assistant (Radiology) OTHER MD:  CHIEF COMPLAINT: Estrogen receptor positive breast cancer  CURRENT TREATMENT: Adjuvant chemotherapy    HISTORY OF CURRENT ILLNESS: From the original intake note:  The patient had bilateral screening mammography at [facility] 12/28/2016.  There was a new mass in the right breast lower inner quadrant and some new grouped calcifications in the right breast same quadrant.  Accordingly the patient was called back for right diagnostic mammography and ultrasonography 01/09/2017.  The breast density was category A.  In the right breast lower inner quadrant there was a new irregular mass.  Again noted was a new group of heterogeneous calcifications in the same quadrant.  Ultrasound confirmed a 1.7 cm irregular mass in the right breast lower inner quadrant.  There were no abnormalities sonographically in the right axilla.  Taken together,  the mass plus calcifications measured up to 4 cm.  Accordingly on 01/11/2017 she underwent biopsy of the right breast mass in question, showing [phone]) invasive ductal carcinoma, grade 2, estrogen receptor 90% positive with strong staining intensity, progesterone receptor negative, with an Mib-1 of 20% and no HER-2 amplification, the signals ratio being 1.26 and the number per cell 2.20  The patient's subsequent history is as detailed below.  INTERVAL HISTORY: Bryan returns today for follow up and treatment of her estrogen receptor positive breast cancer. Today is day 1 cycle 12 of 12 planned  weekly paclitaxel doses.  Tova is tolerating the chemotherapy relatively well and is very happy to be complete with her adjuvant chemotherapy.     REVIEW OF SYSTEMS: Ieisha is fatigued.  She does note some loose stools the first couple of days following chemotherapy.  She also has noted some dry skin issues.  She denies any peripheral neuropathy.  She is anxious to get her port removed.    Anaisha is otherwise feeling well today and a detailed ROS is non contributory.     PAST MEDICAL HISTORY: Past Medical History:  Diagnosis Date  . Acute sinusitis, unspecified   . Anxiety   . Cancer ([place]) 12/2016   right breast cancer  . Depression   . Family history of prostate cancer   . Hypertension   . Obesity, unspecified   . Pneumonia, organism unspecified(486)     PAST SURGICAL HISTORY: Past Surgical History:  Procedure Laterality Date  . BREAST LUMPECTOMY WITH RADIOACTIVE SEED AND SENTINEL LYMPH NODE BIOPSY Right 01/25/2017   Procedure: RIGHT BREAST LUMPECTOMY  WITH 2 RADIOACTIVE SEEDS AND SENTINEL LYMPH NODE BIOPSY;  Surgeon: Erroll Luna, MD;  Location: [place];  Service: General;  Laterality: Right;  . PORTACATH PLACEMENT Right 03/13/2017   Procedure: ULTRASOUND GUIDED INSERTION PORT-A-CATH RIGHT INTERNAL JUGULAR;  Surgeon: Erroll Luna, MD;  Location: [place];  Service: General;  Laterality: Right;  . WISDOM TOOTH EXTRACTION      FAMILY HISTORY Family History  Problem Relation Age of Onset  . Hypertension Mother   . Hypothyroidism Mother   . Hyperlipidemia Mother   . Gout Mother   . Breast cancer Mother 55  . Heart disease Father   . Hypertension Father   .  Diabetes Father   . Hypothyroidism Father   . Prostate cancer Father        dx late 22s  . Hypothyroidism Sister   . Cancer Sister        angiosarcoma - mets  . Esophageal cancer Maternal Uncle   . Stroke Paternal Aunt   . Stroke Paternal Uncle   . Heart attack Maternal Grandfather   . ALS Paternal  Grandfather   . COPD Neg Hx   . Colon cancer Neg Hx   . Rectal cancer Neg Hx   . Stomach cancer Neg Hx   Her father passed away from a massive stroke at 12. Her mother passed away from complications of breast cancer at 34. She had been diagnosed age 41. The patient's sister had angiosarcoma at 78. There is no otherhistory of breast or ovarian cancer in this famiy. The patient is of Ashkenazi extraction  GYNECOLOGIC HISTORY:  Patient's last menstrual period was 07/12/2011.  Menarche age 21, first live birth age 84, she is GXP2. Menopause 2015, never used OCPs or HR   SOCIAL HISTORY:  She is a Electrical engineer at [employer]. Her husband, [name] was a Curator at [employer] in [place]' law, but is retired now. Her daughter [name] teaches nursing at [employer]. [name] is concerned that on her father's side a cousin has tested positive for a deleterious gene; she will try to bring [name] that information]. Son, [name] passed away. The patient does not have any grandchildren. She attends [employer].     ADVANCED DIRECTIVES:    HEALTH MAINTENANCE: Social History   Tobacco Use  . Smoking status: Never Smoker  . Smokeless tobacco: Never Used  Substance Use Topics  . Alcohol use: Yes    Comment: rare glass of wine  . Drug use: No     Colonoscopy: 2013  PAP:  Bone density:never   Allergies  Allergen Reactions  . Hctz [Hydrochlorothiazide] Other (See Comments)    Hyponatremia  . Bactrim [Sulfamethoxazole-Trimethoprim] Rash    Current Outpatient Medications  Medication Sig Dispense Refill  . b complex vitamins tablet Take 1 tablet by mouth daily.    . Coenzyme Q10 (CO Q-10 PO) Take 1 capsule by mouth daily.     [name] Kitchen dexamethasone (DECADRON) 4 MG tablet Take 2 tablets by mouth once a day on the day after chemotherapy and then take 2 tablets two times a day for 2 days. Take with food. (Patient taking differently: Take 8 mg by mouth See admin instructions. Take 8  mg by mouth once a day on the day after chemotherapy and then take 8 mg two times a day for 2 days. Take with food.) 30 tablet 1  . lidocaine-prilocaine (EMLA) cream Apply to affected area once 30 g 3  . LORazepam (ATIVAN) 0.5 MG tablet Take 1 tablet (0.5 mg total) by mouth at bedtime as needed (Nausea or vomiting). 30 tablet 0  . metoprolol succinate (TOPROL-XL) 100 MG 24 hr tablet Take 1 tablet (100 mg total) daily by mouth. 90 tablet 3  . Omega-3 Fatty Acids (FISH OIL PO) Take 1 capsule by mouth daily.     . ondansetron (ZOFRAN) 8 MG tablet TAKE 1 TABLET(8 MG) BY MOUTH EVERY 8 HOURS AS NEEDED FOR NAUSEA OR VOMITING. DO NOT TAKE WITHIN 3 DAYS OF CHEMOTHERAPY DOSE 20 tablet 0  . prochlorperazine (COMPAZINE) 10 MG tablet Take 1 tablet (10 mg total) by mouth every 6 (six) hours as needed (Nausea or vomiting).  30 tablet 1  . sertraline (ZOLOFT) 100 MG tablet Take 1 tablet (100 mg total) daily by mouth. 90 tablet 3  . Telmisartan-Amlodipine 40-5 MG TABS Take 1 tablet daily by mouth. 90 tablet 3  . triamcinolone cream (KENALOG) 0.1 % Apply 1 application 2 (two) times daily topically. (Patient taking differently: Apply 1 application topically 2 (two) times daily as needed (for rash). ) 30 g 6   No current facility-administered medications for this visit.    Facility-Administered Medications Ordered in Other Visits  Medication Dose Route Frequency Provider Last Rate Last Dose  . sodium chloride flush (NS) 0.9 % injection 10 mL  10 mL Intracatheter PRN Magrinat, Virgie Dad, MD   10 mL at 08/08/17 1535    OBJECTIVE:  Vitals:   08/08/17 1217  BP: 131/80  Pulse: 82  Resp: 18  Temp: 98.3 F (36.8 C)  SpO2: 97%     Body mass index is 42.45 kg/m.   Wt Readings from Last 3 Encounters:  08/08/17 247 lb 4.8 oz (112.2 kg)  07/25/17 244 lb 9.6 oz (110.9 kg)  07/11/17 240 lb 14.4 oz (109.3 kg)  ECOG FS:1 - Symptomatic but completely ambulatory GENERAL: Patient is a well appearing female in no acute  distress HEENT:  Sclerae anicteric.  Oropharynx clear and moist. No ulcerations or evidence of oropharyngeal candidiasis. Neck is supple.  NODES:  No cervical, supraclavicular, or axillary lymphadenopathy palpated.  BREAST EXAM:  Deferred. LUNGS:  Clear to auscultation bilaterally.  No wheezes or rhonchi. HEART:  Regular rate and rhythm. No murmur appreciated. ABDOMEN:  Soft, nontender.  Positive, normoactive bowel sounds. No organomegaly palpated. MSK:  No focal spinal tenderness to palpation. Full range of motion bilaterally in the upper extremities. EXTREMITIES:  No peripheral edema.   SKIN:  Clear with no obvious rashes or skin changes. No nail dyscrasia. NEURO:  Nonfocal. Well oriented.  Appropriate affect.      LAB RESULTS:  CMP     Component Value Date/Time   NA 137 08/08/2017 1103   NA 137 01/18/2017 1238   K 4.5 08/08/2017 1103   K 4.1 01/18/2017 1238   CL 105 08/08/2017 1103   CO2 25 08/08/2017 1103   CO2 22 01/18/2017 1238   GLUCOSE 103 (H) 08/08/2017 1103   GLUCOSE 91 01/18/2017 1238   BUN 17 08/08/2017 1103   BUN 19.5 01/18/2017 1238   CREATININE 0.71 08/08/2017 1103   CREATININE 0.7 01/18/2017 1238   CALCIUM 9.7 08/08/2017 1103   CALCIUM 9.8 01/18/2017 1238   PROT 6.9 08/08/2017 1103   PROT 7.9 01/18/2017 1238   ALBUMIN 4.1 08/08/2017 1103   ALBUMIN 4.3 01/18/2017 1238   AST 25 08/08/2017 1103   AST 19 01/18/2017 1238   ALT 56 (H) 08/08/2017 1103   ALT 24 01/18/2017 1238   ALKPHOS 59 08/08/2017 1103   ALKPHOS 53 01/18/2017 1238   BILITOT 0.2 (L) 08/08/2017 1103   BILITOT 0.39 01/18/2017 1238   GFRNONAA >60 08/08/2017 1103   GFRAA >60 08/08/2017 1103    No results found for: TOTALPROTELP, ALBUMINELP, A1GS, A2GS, BETS, BETA2SER, GAMS, MSPIKE, SPEI  No results found for: KPAFRELGTCHN, LAMBDASER, KAPLAMBRATIO  Lab Results  Component Value Date   WBC 5.5 08/08/2017   NEUTROABS 3.7 08/08/2017   HGB 11.9 08/08/2017   HCT 34.9 08/08/2017   MCV 95.0  08/08/2017   PLT 264 08/08/2017    '@LASTCHEMISTRY'$ @  No results found for: LABCA2  No components found for: [id number]  No  results for input(s): INR in the last 168 hours.  No results found for: LABCA2  No results found for: TFT732  No results found for: KGU542  No results found for: HCW237  No results found for: CA2729  No components found for: HGQUANT  No results found for: CEA1 / No results found for: CEA1   No results found for: AFPTUMOR  No results found for: CHROMOGRNA  No results found for: PSA1  Appointment on 08/08/2017  Component Date Value Ref Range Status  . Sodium 08/08/2017 137  135 - 145 mmol/L Final   Please note reference intervals were recently updated.  . Potassium 08/08/2017 4.5  3.5 - 5.1 mmol/L Final  . Chloride 08/08/2017 105  98 - 111 mmol/L Final  . CO2 08/08/2017 25  22 - 32 mmol/L Final  . Glucose, Bld 08/08/2017 103* 70 - 99 mg/dL Final  . BUN 08/08/2017 17  6 - 20 mg/dL Final   Please note change in reference range.  . Creatinine 08/08/2017 0.71  0.44 - 1.00 mg/dL Final  . Calcium 08/08/2017 9.7  8.9 - 10.3 mg/dL Final  . Total Protein 08/08/2017 6.9  6.5 - 8.1 g/dL Final  . Albumin 08/08/2017 4.1  3.5 - 5.0 g/dL Final  . AST 08/08/2017 25  15 - 41 U/L Final  . ALT 08/08/2017 56* 0 - 44 U/L Final  . Alkaline Phosphatase 08/08/2017 59  38 - 126 U/L Final  . Total Bilirubin 08/08/2017 0.2* 0.3 - 1.2 mg/dL Final  . GFR, Est Non Af Am 08/08/2017 >60  >60 mL/min Final  . GFR, Est AFR Am 08/08/2017 >60  >60 mL/min Final   Comment: (NOTE) The eGFR has been calculated using the CKD EPI equation. This calculation has not been validated in all clinical situations. eGFR's persistently <60 mL/min signify possible Chronic Kidney Disease.   Georgiann Hahn gap 08/08/2017 7  5 - 15 Final   Performed at [facility], [place] [address], [place], [place] [address]  . WBC Count 08/08/2017 5.5  3.9 - 10.3 K/uL Final  . RBC 08/08/2017  3.68* 3.70 - 5.45 MIL/uL Final  . Hemoglobin 08/08/2017 11.9  11.6 - 15.9 g/dL Final  . HCT 08/08/2017 34.9  34.8 - 46.6 % Final  . MCV 08/08/2017 95.0  79.5 - 101.0 fL Final  . MCH 08/08/2017 32.2  25.1 - 34.0 pg Final  . MCHC 08/08/2017 33.9  31.5 - 36.0 g/dL Final  . RDW 08/08/2017 14.8* 11.2 - 14.5 % Final  . Platelet Count 08/08/2017 264  145 - 400 K/uL Final  . Neutrophils Relative % 08/08/2017 67  % Final  . Neutro Abs 08/08/2017 3.7  1.5 - 6.5 K/uL Final  . Lymphocytes Relative 08/08/2017 23  % Final  . Lymphs Abs 08/08/2017 1.3  0.9 - 3.3 K/uL Final  . Monocytes Relative 08/08/2017 7  % Final  . Monocytes Absolute 08/08/2017 0.4  0.1 - 0.9 K/uL Final  . Eosinophils Relative 08/08/2017 2  % Final  . Eosinophils Absolute 08/08/2017 0.1  0.0 - 0.5 K/uL Final  . Basophils Relative 08/08/2017 1  % Final  . Basophils Absolute 08/08/2017 0.1  0.0 - 0.1 K/uL Final   Performed at [facility], [place] [address], [place], [place] [address]    (this displays the last labs from the last 3 days)  No results found for: TOTALPROTELP, ALBUMINELP, A1GS, A2GS, BETS, BETA2SER, GAMS, MSPIKE, SPEI (this displays SPEP labs)  No  results found for: KPAFRELGTCHN, LAMBDASER, KAPLAMBRATIO (kappa/lambda light chains)  No results found for: HGBA, HGBA2QUANT, HGBFQUANT, HGBSQUAN (Hemoglobinopathy evaluation)   No results found for: LDH  No results found for: IRON, TIBC, IRONPCTSAT (Iron and TIBC)  No results found for: FERRITIN  Urinalysis    Component Value Date/Time   COLORURINE Yellow 12/29/2008 0809   APPEARANCEUR CLEAR 12/29/2008 0809   LABSPEC 1.020 12/29/2008 0809   PHURINE 6.0 12/29/2008 0809   GLUCOSEU NEGATIVE 12/29/2008 0809   BILIRUBINUR NEGATIVE 12/29/2008 0809   KETONESUR NEGATIVE 12/29/2008 0809   UROBILINOGEN 0.2 12/29/2008 0809   NITRITE NEGATIVE 12/29/2008 0809   LEUKOCYTESUR NEGATIVE 12/29/2008 0809     STUDIES: No results found.  ELIGIBLE  FOR AVAILABLE RESEARCH PROTOCOL: UPBEAT, ASA: Referral placed 02/28/2017  ASSESSMENT: 62 y.o. [place] woman s/p right breast lower inner quadrant biopsy 01/11/2017 for a cT1c cN0, stage IA invasive ductal breast cancer, estrogen receptor positive, progesterone receptor negativem HER-2 not amplified, with an Mib-1 of 20%  (1) genetics testing 03/22/2017 through the Hereditary Gene Panel offered by [facility] found no deleterious mutations in APC, ATM, AXIN2, BARD1, BMPR1A, BRCA1, BRCA2, BRIP1, CDH1, CDK4, CDKN2A (p14ARF), CDKN2A (p16INK4a), CHEK2, CTNNA1, DICER1, EPCAM (Deletion/duplication testing only), GREM1 (promoter region deletion/duplication testing only), KIT, MEN1, MLH1, MSH2, MSH3, MSH6, MUTYH, NBN, NF1, NHTL1, PALB2, PDGFRA, PMS2, POLD1, POLE, PTEN, RAD50, RAD51C, RAD51D, SDHB, SDHC, SDHD, SMAD4, SMARCA4. STK11, TP53, TSC1, TSC2, and VHL.  The following genes were evaluated for sequence changes only: SDHA and HOXB13 c.251G>A variant only.    (2) status post right lumpectomy and sentinel lymph node sampling 01/25/2017 for a pT1c pN0, stage Ia invasive ductal carcinoma, grade 2, with negative margins.  (3) Oncotype DX score of 42 predicts a 9-year risk of recurrence outside the breast of 30% if the patient's only systemic therapy is tamoxifen for 5 years.  It also predicts a significant chemotherapy benefit.  (4) adjuvant chemotherapy consisting of cyclophosphamide and doxorubicin in dose dense fashion x4 started 03/14/2017, completed 05/09/2017, followed by weekly paclitaxel x12 started 05/23/2017, completed 08/08/2017  (a) cycle 3 doxorubicin /cyclophosphamide delayed 1 week because of poor tolerance  (5) adjuvant radiation to follow   (6) antiestrogens to start at the completion of local treatment  PLAN:  Marlaine is doing well today.  I reviewed her labs with her, which were stable.  She will proceed with her final cycle of adjuvant chemotherapy today with Taxol.  I have referred her to  radiation oncology.  I also reviewed her with Bary Castilla, RN her navigator.  Rod Holler met with Dr. Jana Hakim today as well to review her upcoming plan.  She will see him in early September.    She knows to call for any other issues that may develop before her next appointment with [name].   Wilber Bihari, NP  08/08/17 4:52 PM Medical Oncology and Hematology [facility] [address] [place], [place] [address] Tel. [phone]    Fax. [phone]   ADDENDUM: Kariya completes her chemotherapy today.  She did remarkably well.  Except for 1 cycles 1 week delay, there were no dose reductions or delays and there have been no significant toxicities or evidence of endorgan damage.  This is very favorable.  She is now ready to proceed to adjuvant radiation.  She will see me in approximately 2 months at which time we will likely start anastrozole  I commended her on her excellent work.  I am hopeful she will have a very good long-term  result  She knows to call for any issues that may develop before the next visit.  I personally saw this patient and performed a substantive portion of this encounter with the listed APP documented above.   Chauncey Cruel, MD Medical Oncology and Hematology [facility] [address] [place], [place] [address] Tel. [phone]    Fax. [phone]

## 2017-08-11 NOTE — Progress Notes (Signed)
Location of Breast Cancer: Right Breast  Histology per Pathology Report:  01/11/17 Diagnosis Breast, right, needle core biopsy - INVASIVE DUCTAL CARCINOMA. - DUCTAL CARCINOMA IN SITU.  Receptor Status: ER(90%), PR (NEG), Her2-neu (NEG), Ki-(20%)  01/25/17 Diagnosis 1. Breast, lumpectomy, Right Posterior Calcifications - FOCAL ATYPICAL DUCTAL HYPERPLASIA WITH CALCIFICATIONS. - ATYPICAL DUCTAL HYPERPLASIA FOCALLY 0.1 CM FROM THE SUPERIOR MARGIN. - NO INVASIVE CARCINOMA. 2. Breast, lumpectomy, Right w/seed - INVASIVE AND IN SITU DUCTAL CARCINOMA, 1.5 CM, MSBR GRADE II. - MARGINS NOT INVOLVED. - INVASIVE CARCINOMA FOCALLY 0.3 CM FROM POSTERIOR AND LATERAL MARGINS. - PREVIOUS BIOPSY SITE. 3. Lymph node, sentinel, biopsy, Right Axillary - ONE BENIGN LYMPH NODE (0/1). 4. Lymph node, sentinel, biopsy, Right - ONE BENIGN LYMPH NODE (0/1). 5. Lymph node, sentinel, biopsy, Right - ONE BENIGN LYMPH NODE (0/1). 6. Lymph node, sentinel, biopsy, Right - ONE BENIGN LYMPH NODE (0/1). 7. Lymph node, sentinel, biopsy, Right - ONE BENIGN LYMPH NODE (0/1). 8. Lymph node, sentinel, biopsy, Right - ONE BENIGN LYMPH NODE (0/1). 9. Lymph node, sentinel, biopsy, Right - ONE BENIGN LYMPH NODE (0/1).  Did patient present with symptoms or was this found on screening mammography?: It was found on a screening mammogram.   Past/Anticipated interventions by surgeon, if any: 01/25/17 Procedure: Right breast seed localized partial mastectomy and right axillary sentinel lymph node mapping  (deep axillary lymph node) Surgeon Erroll Luna MD  03/13/17 Procedure: Portacath Placement with C arm and U/S guidance  Surgeon: Turner Daniels, MD, FACS   Past/Anticipated interventions by medical oncology, if any:  Dr. Jana Hakim 08/08/17 -adjuvant chemotherapy consisting of cyclophosphamide and doxorubicin in dose dense fashion x4 started 03/14/2017, completed 05/09/2017, followed by weekly paclitaxel x12 started  05/23/2017, completed 08/08/2017             (a) cycle 3 doxorubicin /cyclophosphamide delayed 1 week because of poor tolerance  Shelby Payne completes her chemotherapy today.  She did remarkably well.  Except for 1 cycles 1 week delay, there were no dose reductions or delays and there have been no significant toxicities or evidence of endorgan damage.  This is very favorable. She is now ready to proceed to adjuvant radiation. She will see me in approximately 2 months at which time we will likely start anastrozole I commended her on her excellent work.  I am hopeful she will have a very good long-term result She knows to call for any issues that may develop before the next visit.   Lymphedema issues, if any:   She denies. She reports good arm mobility.   Pain issues, if any:  She denies  SAFETY ISSUES:  Prior radiation? No  Pacemaker/ICD? No  Possible current pregnancy? N/A  Is the patient on methotrexate? No  Current Complaints / other details:    BP (!) 142/95   Pulse 100   Temp 98.5 F (36.9 C)   Resp 20   Ht '5\' 4"'$  (1.626 m)   Wt 242 lb 12.8 oz (110.1 kg)   LMP 07/12/2011   SpO2 97% Comment: room air  BMI 41.68 kg/m      Wt Readings from Last 3 Encounters:  08/22/17 242 lb 12.8 oz (110.1 kg)  08/08/17 247 lb 4.8 oz (112.2 kg)  07/25/17 244 lb 9.6 oz (110.9 kg)   Shelby Payne, Stephani Police, RN 08/11/2017,4:06 PM

## 2017-08-22 ENCOUNTER — Other Ambulatory Visit: Payer: Self-pay

## 2017-08-22 ENCOUNTER — Ambulatory Visit
Admission: RE | Admit: 2017-08-22 | Discharge: 2017-08-22 | Disposition: A | Discharge status: Home / Self Care | Payer: BC Managed Care – PPO | Source: Ambulatory Visit | Attending: Radiation Oncology | Admitting: Radiation Oncology

## 2017-08-22 ENCOUNTER — Encounter: Payer: Self-pay | Admitting: Radiation Oncology

## 2017-08-22 VITALS — BP 142/95 | HR 100 | Temp 98.5°F | Resp 20 | Ht 64.0 in | Wt 242.8 lb

## 2017-08-22 DIAGNOSIS — Z170 Estrogen receptor positive status [ER+]: Secondary | ICD-10-CM | POA: Diagnosis present

## 2017-08-22 DIAGNOSIS — Z882 Allergy status to sulfonamides status: Secondary | ICD-10-CM | POA: Insufficient documentation

## 2017-08-22 DIAGNOSIS — Z79899 Other long term (current) drug therapy: Secondary | ICD-10-CM | POA: Insufficient documentation

## 2017-08-22 DIAGNOSIS — C50311 Malignant neoplasm of lower-inner quadrant of right female breast: Secondary | ICD-10-CM

## 2017-08-22 NOTE — Progress Notes (Signed)
Radiation Oncology         [phone] ________________________________  Name: Shelby Payne MRN: [id number]  Date: 08/22/2017  DOB: 1955/05/07  Follow-Up Visit Note  Outpatient  CC: Hoyt Koch, MD  Pricilla Holm A, *  Diagnosis:      ICD-10-CM   1. 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 She was originally seen in breast clinic on 01/18/2017.   She underwent right breast lumpectomy on 01/25/2017 showing focal atypical ductal hyperplasia with calcification. Atypical ductal hyperplasia focally 0.1 cm from the superior margin. No invasive carcinoma. Right breast with seed showed invasive and IN SITU ductal carcinoma, 1.5 cm, MSBR grade II. Margins not involved. Invasive carcinoma focally 0.3 cm from posterior and lateral margins. Estrogen Receptor: 90%, positive, strong staining; Progesterone Receptor: 0%, negative; Her2: Negative, ratio 1.26; Ki-67: 20%. pT1c, pN0, pMX. All 7 SLN were negative.  On 02/28/2017 she underwent genetic testing which showed, The Hereditary Gene Panel offered by [facility] includes sequencing and/or deletion duplication testing of the following 47 genes: APC, ATM, AXIN2, BARD1, BMPR1A, BRCA1, BRCA2, BRIP1, CDH1, CDK4, CDKN2A (p14ARF), CDKN2A (p16INK4a), CHEK2, CTNNA1, DICER1, EPCAM (Deletion/duplication testing only), GREM1 (promoter region deletion/duplication testing only), KIT, MEN1, MLH1, MSH2, MSH3, MSH6, MUTYH, NBN, NF1, NHTL1, PALB2, PDGFRA, PMS2, POLD1, POLE, PTEN, RAD50, RAD51C, RAD51D, SDHB, SDHC, SDHD, SMAD4, SMARCA4. STK11, TP53, TSC1, TSC2, and VHL.  The following genes were evaluated for sequence changes only: SDHA  and HOXB13 c.251G>A variant only. Unfortunately the patient's Oncotype score was quite high, at 42%.  This predicts a 30% risk of recurrence outside the breast within 9 years if the patient's only systemic therapy is tamoxifen for 5 years.  It also predicts a significant benefit from chemotherapy.  The patient completed 12 cycles of weekly paclitaxel doses on 08/08/2017. She is being followed by Dr. Jana Hakim.         ALLERGIES:  is allergic to hctz [hydrochlorothiazide] and bactrim [sulfamethoxazole-trimethoprim].  Meds: Current Outpatient Medications  Medication Sig Dispense Refill  . b complex vitamins tablet Take 1 tablet by mouth daily.    . Coenzyme Q10 (CO Q-10 PO) Take 1 capsule by mouth daily.     [name] Kitchen lidocaine-prilocaine (EMLA) cream Apply to affected area once 30 g 3  . metoprolol succinate (TOPROL-XL) 100 MG 24 hr tablet Take 1 tablet (100 mg total) daily by mouth. 90 tablet 3  . Omega-3 Fatty Acids (FISH OIL PO) Take 1 capsule by mouth daily.     . sertraline (ZOLOFT) 100 MG tablet Take 1 tablet (100 mg total) daily by mouth. 90 tablet 3  . Telmisartan-Amlodipine 40-5 MG TABS Take 1 tablet daily by mouth. 90 tablet 3  . triamcinolone cream (KENALOG) 0.1 % Apply 1 application 2 (two) times daily topically. (Patient taking differently: Apply 1 application topically 2 (two) times daily as needed (for rash). ) 30 g 6  . dexamethasone (DECADRON) 4 MG tablet Take 2 tablets by mouth once a day on the day after chemotherapy and then take 2 tablets two times a day for 2 days. Take with food. (Patient not taking: Reported on 08/22/2017) 30 tablet 1  . LORazepam (ATIVAN) 0.5 MG  tablet Take 1 tablet (0.5 mg total) by mouth at bedtime as needed (Nausea or vomiting). (Patient not taking: Reported on 08/22/2017) 30 tablet 0  . ondansetron (ZOFRAN) 8 MG tablet TAKE 1 TABLET(8 MG) BY MOUTH EVERY 8 HOURS AS NEEDED FOR NAUSEA OR VOMITING. DO NOT TAKE WITHIN 3 DAYS OF CHEMOTHERAPY DOSE (Patient not taking:  Reported on 08/22/2017) 20 tablet 0  . prochlorperazine (COMPAZINE) 10 MG tablet Take 1 tablet (10 mg total) by mouth every 6 (six) hours as needed (Nausea or vomiting). (Patient not taking: Reported on 08/22/2017) 30 tablet 1   No current facility-administered medications for this encounter.     Physical Findings:  height is '5\' 4"'$  (1.626 m) and weight is 242 lb 12.8 oz (110.1 kg). Her temperature is 98.5 F (36.9 C). Her blood pressure is 142/95 (abnormal) and her pulse is 100. Her respiration is 20 and oxygen saturation is 97%. [name] Kitchen     Heart: Regular in rate and rhythm with no murmurs, rubs, or gallops. Chest: Clear to auscultation bilaterally, with no rhonchi, wheezes, or rales.  Breast exam reveals axillary and lumpectomy scars have healed well over her right breast.  Lab Findings: Lab Results  Component Value Date   WBC 5.5 08/08/2017   HGB 11.9 08/08/2017   HCT 34.9 08/08/2017   MCV 95.0 08/08/2017   PLT 264 08/08/2017    '@LASTCHEMISTRY'$ @  Radiographic Findings: No results found.  Impression/Plan: We discussed adjuvant radiotherapy today.  I recommend 4 weeks of radiotherapy to right breast to reduce locoregional recurrence by 2/3.  The risks, benefits and side effects of this treatment were discussed in detail.  She understands that radiotherapy is associated with skin irritation and fatigue in the acute setting. Late effects can include cosmetic changes and rare injury to internal organs.   She is enthusiastic about proceeding with treatment. A consent form has been signed and placed in her chart.  A total of 3 medically necessary complex treatment devices will be fabricated and supervised by me: 2 fields with MLCs for custom blocks to protect heart, and lungs;  and, a Vac-lok. MORE COMPLEX DEVICES MAY BE MADE IN DOSIMETRY FOR FIELD IN FIELD BEAMS FOR DOSE HOMOGENEITY.  I have requested : 3D Simulation which is medically necessary to give adequate dose to at risk tissues while  sparing lungs and heart.  I have requested a DVH of the following structures: lungs, heart, right lumpectomy cavity.    The patient will receive 40.05 Gy in 15 fractions to the right breast.  This will be followed by a boost. Simulation tomorrow.  I spent at least 25 minutes minutes face to face with the patient and more than 50% of that time was spent in counseling and/or coordination of care. _____________________________________   Eppie Gibson, MD  This document serves as a record of services personally performed by Eppie Gibson, MD. It was created on her behalf by Margit Banda, a trained medical scribe. The creation of this record is based on the scribe's personal observations and the provider's statements to them. This document has been checked and approved by the attending provider.

## 2017-08-23 ENCOUNTER — Ambulatory Visit
Admission: RE | Admit: 2017-08-23 | Discharge: 2017-08-23 | Disposition: A | Discharge status: Home / Self Care | Payer: BC Managed Care – PPO | Source: Ambulatory Visit | Attending: Radiation Oncology | Admitting: Radiation Oncology

## 2017-08-23 DIAGNOSIS — C50311 Malignant neoplasm of lower-inner quadrant of right female breast: Secondary | ICD-10-CM | POA: Diagnosis present

## 2017-08-23 DIAGNOSIS — Z170 Estrogen receptor positive status [ER+]: Secondary | ICD-10-CM | POA: Diagnosis not present

## 2017-08-23 DIAGNOSIS — Z510 Encounter for antineoplastic radiation therapy: Secondary | ICD-10-CM | POA: Diagnosis not present

## 2017-08-23 NOTE — Progress Notes (Signed)
  Radiation Oncology         [phone] ________________________________  Name: Shelby Payne MRN: [id number]  Date: 08/23/2017  DOB: 1955/03/27  SIMULATION AND TREATMENT PLANNING NOTE    Outpatient  DIAGNOSIS:     ICD-10-CM   1. Malignant neoplasm of lower-inner quadrant of right breast of female, estrogen receptor positive ([place]) C50.311    Z17.0     NARRATIVE:  The patient was brought to the [facility].  Identity was confirmed.  All relevant records and images related to the planned course of therapy were reviewed.  The patient freely provided informed written consent to proceed with treatment after reviewing the details related to the planned course of therapy. The consent form was witnessed and verified by the simulation staff.    Then, the patient was set-up in a stable reproducible supine position for radiation therapy with her ipsilateral arm over her head, and her upper body secured in a custom-made Vac-lok device.  CT images were obtained.  Surface markings were placed.  The CT images were loaded into the planning software.    TREATMENT PLANNING NOTE: Treatment planning then occurred.  The radiation prescription was entered and confirmed.     A total of 3 medically necessary complex treatment devices were fabricated and supervised by me: 2 fields with MLCs for custom blocks to protect heart, and lungs;  and, a Vac-lok. MORE COMPLEX DEVICES MAY BE MADE IN DOSIMETRY FOR FIELD IN FIELD BEAMS FOR DOSE HOMOGENEITY.  I have requested : 3D Simulation which is medically necessary to give adequate dose to at risk tissues while sparing lungs and heart.  I have requested a DVH of the following structures: lungs, heart, right lumpectomy cavity.    The patient will receive 40.05 Gy in 15 fractions to the right breast with 2 tangential fields.  This will be followed by a boost.  Optical Surface Tracking Plan:  Since intensity modulated radiotherapy (IMRT) and 3D  conformal radiation treatment methods are predicated on accurate and precise positioning for treatment, intrafraction motion monitoring is medically necessary to ensure accurate and safe treatment delivery. The ability to quantify intrafraction motion without excessive ionizing radiation dose can only be performed with optical surface tracking. Accordingly, surface imaging offers the opportunity to obtain 3D measurements of patient position throughout IMRT and 3D treatments without excessive radiation exposure. I am ordering optical surface tracking for this patient's upcoming course of radiotherapy.  ________________________________   Reference:  Ursula Alert, J, et al. Surface imaging-based analysis of intrafraction motion for breast radiotherapy patients.Journal of [employer], n. 6, nov. 2014. ISSN [id number].  Available at: <http://www.jacmp.org/index.php/jacmp/article/view/4957>.    -----------------------------------  Eppie Gibson, MD

## 2017-08-25 ENCOUNTER — Other Ambulatory Visit: Payer: Self-pay | Admitting: Internal Medicine

## 2017-08-25 ENCOUNTER — Encounter: Payer: Self-pay | Admitting: Radiation Oncology

## 2017-08-25 DIAGNOSIS — I10 Essential (primary) hypertension: Secondary | ICD-10-CM

## 2017-08-25 DIAGNOSIS — C50311 Malignant neoplasm of lower-inner quadrant of right female breast: Secondary | ICD-10-CM | POA: Diagnosis not present

## 2017-08-30 ENCOUNTER — Ambulatory Visit
Admission: RE | Admit: 2017-08-30 | Discharge: 2017-08-30 | Disposition: A | Discharge status: Home / Self Care | Payer: BC Managed Care – PPO | Source: Ambulatory Visit | Attending: Radiation Oncology | Admitting: Radiation Oncology

## 2017-08-30 DIAGNOSIS — C50311 Malignant neoplasm of lower-inner quadrant of right female breast: Secondary | ICD-10-CM

## 2017-08-30 DIAGNOSIS — Z170 Estrogen receptor positive status [ER+]: Principal | ICD-10-CM

## 2017-08-30 MED ORDER — ALRA NON-METALLIC DEODORANT (RAD-ONC)
1.0000 "application " | Freq: Once | TOPICAL | Status: AC
  Administered 2017-08-30: 1 via TOPICAL

## 2017-08-30 MED ORDER — RADIAPLEXRX EX GEL
Freq: Once | CUTANEOUS | Status: AC
  Administered 2017-08-30: 16:00:00 via TOPICAL

## 2017-08-30 NOTE — Progress Notes (Signed)

## 2017-08-31 ENCOUNTER — Ambulatory Visit
Admission: RE | Admit: 2017-08-31 | Discharge: 2017-08-31 | Disposition: A | Discharge status: Home / Self Care | Payer: BC Managed Care – PPO | Source: Ambulatory Visit | Attending: Radiation Oncology | Admitting: Radiation Oncology

## 2017-08-31 DIAGNOSIS — C50311 Malignant neoplasm of lower-inner quadrant of right female breast: Secondary | ICD-10-CM | POA: Diagnosis not present

## 2017-09-01 ENCOUNTER — Ambulatory Visit
Admission: RE | Admit: 2017-09-01 | Discharge: 2017-09-01 | Disposition: A | Discharge status: Home / Self Care | Payer: BC Managed Care – PPO | Source: Ambulatory Visit | Attending: Radiation Oncology | Admitting: Radiation Oncology

## 2017-09-01 DIAGNOSIS — C50311 Malignant neoplasm of lower-inner quadrant of right female breast: Secondary | ICD-10-CM | POA: Diagnosis not present

## 2017-09-04 ENCOUNTER — Ambulatory Visit
Admission: RE | Admit: 2017-09-04 | Discharge: 2017-09-04 | Disposition: A | Discharge status: Home / Self Care | Payer: BC Managed Care – PPO | Source: Ambulatory Visit | Attending: Radiation Oncology | Admitting: Radiation Oncology

## 2017-09-04 DIAGNOSIS — C50311 Malignant neoplasm of lower-inner quadrant of right female breast: Secondary | ICD-10-CM | POA: Diagnosis not present

## 2017-09-05 ENCOUNTER — Ambulatory Visit
Admission: RE | Admit: 2017-09-05 | Discharge: 2017-09-05 | Disposition: A | Discharge status: Home / Self Care | Payer: BC Managed Care – PPO | Source: Ambulatory Visit | Attending: Radiation Oncology | Admitting: Radiation Oncology

## 2017-09-05 DIAGNOSIS — C50311 Malignant neoplasm of lower-inner quadrant of right female breast: Secondary | ICD-10-CM | POA: Diagnosis not present

## 2017-09-06 ENCOUNTER — Ambulatory Visit
Admission: RE | Admit: 2017-09-06 | Discharge: 2017-09-06 | Disposition: A | Discharge status: Home / Self Care | Payer: BC Managed Care – PPO | Source: Ambulatory Visit | Attending: Radiation Oncology | Admitting: Radiation Oncology

## 2017-09-06 DIAGNOSIS — C50311 Malignant neoplasm of lower-inner quadrant of right female breast: Secondary | ICD-10-CM | POA: Diagnosis not present

## 2017-09-07 ENCOUNTER — Ambulatory Visit
Admission: RE | Admit: 2017-09-07 | Discharge: 2017-09-07 | Disposition: A | Discharge status: Home / Self Care | Payer: BC Managed Care – PPO | Source: Ambulatory Visit | Attending: Radiation Oncology | Admitting: Radiation Oncology

## 2017-09-07 DIAGNOSIS — C50311 Malignant neoplasm of lower-inner quadrant of right female breast: Secondary | ICD-10-CM | POA: Insufficient documentation

## 2017-09-07 DIAGNOSIS — Z510 Encounter for antineoplastic radiation therapy: Secondary | ICD-10-CM | POA: Diagnosis not present

## 2017-09-08 ENCOUNTER — Ambulatory Visit
Admission: RE | Admit: 2017-09-08 | Discharge: 2017-09-08 | Disposition: A | Discharge status: Home / Self Care | Payer: BC Managed Care – PPO | Source: Ambulatory Visit | Attending: Radiation Oncology | Admitting: Radiation Oncology

## 2017-09-08 DIAGNOSIS — C50311 Malignant neoplasm of lower-inner quadrant of right female breast: Secondary | ICD-10-CM | POA: Diagnosis not present

## 2017-09-11 ENCOUNTER — Ambulatory Visit
Admission: RE | Admit: 2017-09-11 | Discharge: 2017-09-11 | Disposition: A | Discharge status: Home / Self Care | Payer: BC Managed Care – PPO | Source: Ambulatory Visit | Attending: Radiation Oncology | Admitting: Radiation Oncology

## 2017-09-11 ENCOUNTER — Ambulatory Visit: Payer: BC Managed Care – PPO | Admitting: Radiation Oncology

## 2017-09-11 DIAGNOSIS — C50311 Malignant neoplasm of lower-inner quadrant of right female breast: Secondary | ICD-10-CM | POA: Diagnosis not present

## 2017-09-12 ENCOUNTER — Ambulatory Visit
Admission: RE | Admit: 2017-09-12 | Discharge: 2017-09-12 | Disposition: A | Discharge status: Home / Self Care | Payer: BC Managed Care – PPO | Source: Ambulatory Visit | Attending: Radiation Oncology | Admitting: Radiation Oncology

## 2017-09-12 DIAGNOSIS — C50311 Malignant neoplasm of lower-inner quadrant of right female breast: Secondary | ICD-10-CM | POA: Diagnosis not present

## 2017-09-13 ENCOUNTER — Ambulatory Visit
Admission: RE | Admit: 2017-09-13 | Discharge: 2017-09-13 | Disposition: A | Discharge status: Home / Self Care | Payer: BC Managed Care – PPO | Source: Ambulatory Visit | Attending: Radiation Oncology | Admitting: Radiation Oncology

## 2017-09-13 DIAGNOSIS — C50311 Malignant neoplasm of lower-inner quadrant of right female breast: Secondary | ICD-10-CM | POA: Diagnosis not present

## 2017-09-14 ENCOUNTER — Ambulatory Visit
Admission: RE | Admit: 2017-09-14 | Discharge: 2017-09-14 | Disposition: A | Discharge status: Home / Self Care | Payer: BC Managed Care – PPO | Source: Ambulatory Visit | Attending: Radiation Oncology | Admitting: Radiation Oncology

## 2017-09-14 DIAGNOSIS — C50311 Malignant neoplasm of lower-inner quadrant of right female breast: Secondary | ICD-10-CM | POA: Diagnosis not present

## 2017-09-15 ENCOUNTER — Ambulatory Visit
Admission: RE | Admit: 2017-09-15 | Discharge: 2017-09-15 | Disposition: A | Discharge status: Home / Self Care | Payer: BC Managed Care – PPO | Source: Ambulatory Visit | Attending: Radiation Oncology | Admitting: Radiation Oncology

## 2017-09-15 DIAGNOSIS — C50311 Malignant neoplasm of lower-inner quadrant of right female breast: Secondary | ICD-10-CM | POA: Diagnosis not present

## 2017-09-18 ENCOUNTER — Inpatient Hospital Stay: Payer: BC Managed Care – PPO | Attending: Oncology

## 2017-09-18 ENCOUNTER — Ambulatory Visit
Admission: RE | Admit: 2017-09-18 | Discharge: 2017-09-18 | Disposition: A | Discharge status: Home / Self Care | Payer: BC Managed Care – PPO | Source: Ambulatory Visit | Attending: Radiation Oncology | Admitting: Radiation Oncology

## 2017-09-18 ENCOUNTER — Ambulatory Visit: Payer: BC Managed Care – PPO | Admitting: Radiation Oncology

## 2017-09-18 DIAGNOSIS — Z452 Encounter for adjustment and management of vascular access device: Secondary | ICD-10-CM | POA: Diagnosis not present

## 2017-09-18 DIAGNOSIS — C50311 Malignant neoplasm of lower-inner quadrant of right female breast: Secondary | ICD-10-CM | POA: Diagnosis present

## 2017-09-18 DIAGNOSIS — Z95828 Presence of other vascular implants and grafts: Secondary | ICD-10-CM

## 2017-09-18 MED ORDER — SODIUM CHLORIDE 0.9% FLUSH
10.0000 mL | INTRAVENOUS | Status: DC | PRN
  Administered 2017-09-18: 10 mL
  Filled 2017-09-18: qty 10

## 2017-09-18 MED ORDER — HEPARIN SOD (PORK) LOCK FLUSH 100 UNIT/ML IV SOLN
500.0000 [IU] | Freq: Once | INTRAVENOUS | Status: AC | PRN
  Administered 2017-09-18: 500 [IU]
  Filled 2017-09-18: qty 5

## 2017-09-19 ENCOUNTER — Ambulatory Visit
Admission: RE | Admit: 2017-09-19 | Discharge: 2017-09-19 | Disposition: A | Discharge status: Home / Self Care | Payer: BC Managed Care – PPO | Source: Ambulatory Visit | Attending: Radiation Oncology | Admitting: Radiation Oncology

## 2017-09-19 ENCOUNTER — Ambulatory Visit: Payer: BC Managed Care – PPO | Admitting: Radiation Oncology

## 2017-09-19 DIAGNOSIS — C50311 Malignant neoplasm of lower-inner quadrant of right female breast: Secondary | ICD-10-CM | POA: Diagnosis not present

## 2017-09-20 ENCOUNTER — Ambulatory Visit
Admission: RE | Admit: 2017-09-20 | Discharge: 2017-09-20 | Disposition: A | Discharge status: Home / Self Care | Payer: BC Managed Care – PPO | Source: Ambulatory Visit | Attending: Radiation Oncology | Admitting: Radiation Oncology

## 2017-09-20 DIAGNOSIS — C50311 Malignant neoplasm of lower-inner quadrant of right female breast: Secondary | ICD-10-CM | POA: Diagnosis not present

## 2017-09-21 ENCOUNTER — Ambulatory Visit
Admission: RE | Admit: 2017-09-21 | Discharge: 2017-09-21 | Disposition: A | Discharge status: Home / Self Care | Payer: BC Managed Care – PPO | Source: Ambulatory Visit | Attending: Radiation Oncology | Admitting: Radiation Oncology

## 2017-09-21 DIAGNOSIS — C50311 Malignant neoplasm of lower-inner quadrant of right female breast: Secondary | ICD-10-CM | POA: Diagnosis not present

## 2017-09-22 ENCOUNTER — Ambulatory Visit
Admission: RE | Admit: 2017-09-22 | Discharge: 2017-09-22 | Disposition: A | Discharge status: Home / Self Care | Payer: BC Managed Care – PPO | Source: Ambulatory Visit | Attending: Radiation Oncology | Admitting: Radiation Oncology

## 2017-09-22 DIAGNOSIS — C50311 Malignant neoplasm of lower-inner quadrant of right female breast: Secondary | ICD-10-CM | POA: Diagnosis not present

## 2017-09-24 ENCOUNTER — Ambulatory Visit: Admission: RE | Admit: 2017-09-24 | Payer: BC Managed Care – PPO | Source: Ambulatory Visit

## 2017-09-25 ENCOUNTER — Ambulatory Visit
Admission: RE | Admit: 2017-09-25 | Discharge: 2017-09-25 | Disposition: A | Discharge status: Home / Self Care | Payer: BC Managed Care – PPO | Source: Ambulatory Visit | Attending: Radiation Oncology | Admitting: Radiation Oncology

## 2017-09-25 DIAGNOSIS — Z170 Estrogen receptor positive status [ER+]: Principal | ICD-10-CM

## 2017-09-25 DIAGNOSIS — C50311 Malignant neoplasm of lower-inner quadrant of right female breast: Secondary | ICD-10-CM

## 2017-09-25 MED ORDER — RADIAPLEXRX EX GEL
Freq: Once | CUTANEOUS | Status: AC
  Administered 2017-09-25: 16:00:00 via TOPICAL

## 2017-09-26 ENCOUNTER — Ambulatory Visit
Admission: RE | Admit: 2017-09-26 | Discharge: 2017-09-26 | Disposition: A | Discharge status: Home / Self Care | Payer: BC Managed Care – PPO | Source: Ambulatory Visit | Attending: Radiation Oncology | Admitting: Radiation Oncology

## 2017-09-26 DIAGNOSIS — C50311 Malignant neoplasm of lower-inner quadrant of right female breast: Secondary | ICD-10-CM | POA: Diagnosis not present

## 2017-09-27 ENCOUNTER — Ambulatory Visit
Admission: RE | Admit: 2017-09-27 | Discharge: 2017-09-27 | Disposition: A | Discharge status: Home / Self Care | Payer: BC Managed Care – PPO | Source: Ambulatory Visit | Attending: Radiation Oncology | Admitting: Radiation Oncology

## 2017-09-27 ENCOUNTER — Other Ambulatory Visit: Payer: Self-pay | Admitting: Oncology

## 2017-09-27 ENCOUNTER — Encounter: Payer: Self-pay | Admitting: Radiation Oncology

## 2017-09-27 DIAGNOSIS — C50311 Malignant neoplasm of lower-inner quadrant of right female breast: Secondary | ICD-10-CM | POA: Diagnosis not present

## 2017-09-27 DIAGNOSIS — Z170 Estrogen receptor positive status [ER+]: Principal | ICD-10-CM

## 2017-10-03 NOTE — Progress Notes (Signed)
  Radiation Oncology         [phone] ________________________________  Name: Shelby Payne MRN: [id number]  Date: 09/27/2017  DOB: 31-May-1955  End of Treatment Note  Diagnosis:   62 y.o. 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 Right Breast / 40.05 Gy in 15 fractions 2. Right Breast Boost / 10 Gy in 5 fractions  Beams/energy:    1. 3D / 10X Photon 2. 3D / 6X, 10X Photon  Narrative: The patient tolerated radiation treatment relatively well.  She did have some expected skin irritation and erythema as she progressed through treatment. She is using Radiaplex twice daily as directed. She was advised to apply hydrocortisone cream to relieve itching on her upper right breast. She also noted some fatigue.  Plan: The patient has completed radiation treatment. The patient will return to radiation oncology clinic for routine followup in one month. I advised them to call or return sooner if they have any questions or concerns related to their recovery or treatment.  -----------------------------------  Eppie Gibson, MD  This document serves as a record of services personally performed by Eppie Gibson, MD. It was created on her behalf by Rae Lips, a trained medical scribe. The creation of this record is based on the scribe's personal observations and the provider's statements to them. This document has been checked and approved by the attending provider.

## 2017-10-11 NOTE — Progress Notes (Signed)
[facility]  [phone] [phone]     ID: Shelby Payne DOB: 08-Jan-1956  MR#: [id number]  MGQ#:[id number]  Patient Care Team: Hoyt Koch, MD as PCP - General (Internal Medicine) Aviyanna Colbaugh, Virgie Dad, MD as Consulting Physician (Oncology) Eppie Gibson, MD as Attending Physician (Radiation Oncology) Erroll Luna, MD as Consulting Physician (General Surgery) Christene Slates, MD as Physician Assistant (Radiology) OTHER MD:  CHIEF COMPLAINT: Estrogen receptor positive breast cancer  CURRENT TREATMENT: Anastrozole   HISTORY OF CURRENT ILLNESS: From the original intake note:  The patient had bilateral screening mammography at [facility] 12/28/2016.  There was a new mass in the right breast lower inner quadrant and some new grouped calcifications in the right breast same quadrant.  Accordingly the patient was called back for right diagnostic mammography and ultrasonography 01/09/2017.  The breast density was category A.  In the right breast lower inner quadrant there was a new irregular mass.  Again noted was a new group of heterogeneous calcifications in the same quadrant.  Ultrasound confirmed a 1.7 cm irregular mass in the right breast lower inner quadrant.  There were no abnormalities sonographically in the right axilla.  Taken together,  the mass plus calcifications measured up to 4 cm.  Accordingly on 01/11/2017 she underwent biopsy of the right breast mass in question, showing [phone]) invasive ductal carcinoma, grade 2, estrogen receptor 90% positive with strong staining intensity, progesterone receptor negative, with an Mib-1 of 20% and no HER-2 amplification, the signals ratio being 1.26 and the number per cell 2.20  The patient's subsequent history is as detailed below.  INTERVAL HISTORY: Shelby Payne returns today for follow up and treatment of her estrogen receptor positive breast cancer. She completed radiation treatments on 09/27/2017.   She generally did very well with the treatments themselves.  After the treatment though she did experience more fatigue.  She has been sleeping 11 and 12 hours at night which is unusual for her.  She has also had some pain and redness under the breast, with minimal peeling.  She is now ready to consider antiestrogens.  REVIEW OF SYSTEMS: Shelby Payne has not started a regular exercise program.  Her hair is growing nicely although she is still wearing a wig.  She tells me she has no bald spots.  She still has her port in place.  She wonders how were going to find out if she develops an early recurrence.  She tells me family is doing fine.  A detailed review of systems today was otherwise noncontributory  PAST MEDICAL HISTORY: Past Medical History:  Diagnosis Date  . Acute sinusitis, unspecified   . Anxiety   . Cancer ([place]) 12/2016   right breast cancer  . Depression   . Family history of prostate cancer   . Hypertension   . Obesity, unspecified   . Pneumonia, organism unspecified(486)     PAST SURGICAL HISTORY: Past Surgical History:  Procedure Laterality Date  . BREAST LUMPECTOMY WITH RADIOACTIVE SEED AND SENTINEL LYMPH NODE BIOPSY Right 01/25/2017   Procedure: RIGHT BREAST LUMPECTOMY  WITH 2 RADIOACTIVE SEEDS AND SENTINEL LYMPH NODE BIOPSY;  Surgeon: Erroll Luna, MD;  Location: [facility];  Service: General;  Laterality: Right;  . PORTACATH PLACEMENT Right 03/13/2017   Procedure: ULTRASOUND GUIDED INSERTION PORT-A-CATH RIGHT INTERNAL JUGULAR;  Surgeon: Erroll Luna, MD;  Location: [facility];  Service: General;  Laterality: Right;  . WISDOM TOOTH EXTRACTION      FAMILY HISTORY Family History  Problem Relation  Age of Onset  . Hypertension Mother   . Hypothyroidism Mother   . Hyperlipidemia Mother   . Gout Mother   . Breast cancer Mother 38  . Heart disease Father   . Hypertension Father   . Diabetes Father   . Hypothyroidism Father   . Prostate cancer Father         dx late 54s  . Hypothyroidism Sister   . Cancer Sister        angiosarcoma - mets  . Esophageal cancer Maternal Uncle   . Stroke Paternal Aunt   . Stroke Paternal Uncle   . Heart attack Maternal Grandfather   . ALS Paternal Grandfather   . COPD Neg Hx   . Colon cancer Neg Hx   . Rectal cancer Neg Hx   . Stomach cancer Neg Hx   Her father passed away from a massive stroke at 45. Her mother passed away from complications of breast cancer at 79. She had been diagnosed age 72. The patient's sister had angiosarcoma at 72. There is no otherhistory of breast or ovarian cancer in this famiy. The patient is of Ashkenazi extraction  GYNECOLOGIC HISTORY:  Patient's last menstrual period was 07/12/2011.  Menarche age 78, first live birth age 23, she is GXP2. Menopause 2015, never used OCPs or HR   SOCIAL HISTORY:  She is a Electrical engineer at [employer]. Her husband, [name] was a Curator at [employer] in [place]' [employer] coach, but is retired now. Her daughter [name] teaches nursing at [employer]. [name] is concerned that on her father's side a cousin has tested positive for a deleterious gene; she will try to bring [name] that information]. Son, [name] passed away. The patient does not have any grandchildren. She attends [employer].     ADVANCED DIRECTIVES:    HEALTH MAINTENANCE: Social History   Tobacco Use  . Smoking status: Never Smoker  . Smokeless tobacco: Never Used  Substance Use Topics  . Alcohol use: Yes    Comment: rare glass of wine  . Drug use: No     Colonoscopy: 2013  PAP:  Bone density: Due December 2019 at [facility] Reactions  . Hctz [Hydrochlorothiazide] Other (See Comments)    Hyponatremia  . Bactrim [Sulfamethoxazole-Trimethoprim] Rash    Current Outpatient Medications  Medication Sig Dispense Refill  . anastrozole (ARIMIDEX) 1 MG tablet Take 1 tablet (1 mg total) by mouth daily. 90 tablet 4  . b complex vitamins tablet  Take 1 tablet by mouth daily.    . Coenzyme Q10 (CO Q-10 PO) Take 1 capsule by mouth daily.     . metoprolol succinate (TOPROL-XL) 100 MG 24 hr tablet Take 1 tablet (100 mg total) daily by mouth. 90 tablet 3  . Omega-3 Fatty Acids (FISH OIL PO) Take 1 capsule by mouth daily.     . sertraline (ZOLOFT) 100 MG tablet Take 1 tablet (100 mg total) daily by mouth. 90 tablet 3  . Telmisartan-Amlodipine 40-5 MG TABS Take 1 tablet daily by mouth. 90 tablet 3  . triamcinolone cream (KENALOG) 0.1 % Apply 1 application 2 (two) times daily topically. (Patient taking differently: Apply 1 application topically 2 (two) times daily as needed (for rash). ) 30 g 6   No current facility-administered medications for this visit.     OBJECTIVE: Middle-aged white woman in no acute distress  Vitals:   10/12/17 1143  BP: 130/79  Pulse: 82  Resp: 18  Temp:  98.7 F (37.1 C)  SpO2: 95%     Body mass index is 40.65 kg/m.   Wt Readings from Last 3 Encounters:  10/12/17 236 lb 12.8 oz (107.4 kg)  08/22/17 242 lb 12.8 oz (110.1 kg)  08/08/17 247 lb 4.8 oz (112.2 kg)  ECOG FS:1 - Symptomatic but completely ambulatory  Sclerae unicteric, EOMs intact No cervical or supraclavicular adenopathy Lungs no rales or rhonchi Heart regular rate and rhythm Abd soft, nontender, positive bowel sounds MSK no focal spinal tenderness, no upper extremity lymphedema Neuro: nonfocal, well oriented, appropriate affect Breasts: The right breast is status post lumpectomy and radiation.  The incisions have healed very nicely.  There is some dry desquamation in the inferior mammary fold.  There is some erythema there as well, but otherwise no hyperpigmentation, no swelling, and no tenderness.  There is some skin coarsening inferior laterally from the radiation.  The left breast is benign.  Both axillae are benign.   LAB RESULTS:  CMP     Component Value Date/Time   NA 137 08/08/2017 1103   NA 137 01/18/2017 1238   K 4.5  08/08/2017 1103   K 4.1 01/18/2017 1238   CL 105 08/08/2017 1103   CO2 25 08/08/2017 1103   CO2 22 01/18/2017 1238   GLUCOSE 103 (H) 08/08/2017 1103   GLUCOSE 91 01/18/2017 1238   BUN 17 08/08/2017 1103   BUN 19.5 01/18/2017 1238   CREATININE 0.71 08/08/2017 1103   CREATININE 0.7 01/18/2017 1238   CALCIUM 9.7 08/08/2017 1103   CALCIUM 9.8 01/18/2017 1238   PROT 6.9 08/08/2017 1103   PROT 7.9 01/18/2017 1238   ALBUMIN 4.1 08/08/2017 1103   ALBUMIN 4.3 01/18/2017 1238   AST 25 08/08/2017 1103   AST 19 01/18/2017 1238   ALT 56 (H) 08/08/2017 1103   ALT 24 01/18/2017 1238   ALKPHOS 59 08/08/2017 1103   ALKPHOS 53 01/18/2017 1238   BILITOT 0.2 (L) 08/08/2017 1103   BILITOT 0.39 01/18/2017 1238   GFRNONAA >60 08/08/2017 1103   GFRAA >60 08/08/2017 1103    No results found for: TOTALPROTELP, ALBUMINELP, A1GS, A2GS, BETS, BETA2SER, GAMS, MSPIKE, SPEI  No results found for: KPAFRELGTCHN, LAMBDASER, KAPLAMBRATIO  Lab Results  Component Value Date   WBC 7.0 10/12/2017   NEUTROABS 4.7 10/12/2017   HGB 13.0 10/12/2017   HCT 38.8 10/12/2017   MCV 91.3 10/12/2017   PLT 205 10/12/2017    '@LASTCHEMISTRY'$ @  No results found for: LABCA2  No components found for: NATFTD322  No results for input(s): INR in the last 168 hours.  No results found for: LABCA2  No results found for: GUR427  No results found for: CWC376  No results found for: EGB151  No results found for: CA2729  No components found for: HGQUANT  No results found for: CEA1 / No results found for: CEA1   No results found for: AFPTUMOR  No results found for: CHROMOGRNA  No results found for: PSA1  Appointment on 10/12/2017  Component Date Value Ref Range Status  . WBC Count 10/12/2017 7.0  3.9 - 10.3 K/uL Final  . RBC 10/12/2017 4.25  3.70 - 5.45 MIL/uL Final  . Hemoglobin 10/12/2017 13.0  11.6 - 15.9 g/dL Final  . HCT 10/12/2017 38.8  34.8 - 46.6 % Final  . MCV 10/12/2017 91.3  79.5 - 101.0 fL Final    . MCH 10/12/2017 30.6  25.1 - 34.0 pg Final  . MCHC 10/12/2017 33.5  31.5 - 36.0  g/dL Final  . RDW 10/12/2017 13.0  11.2 - 14.5 % Final  . Platelet Count 10/12/2017 205  145 - 400 K/uL Final  . Neutrophils Relative % 10/12/2017 67  % Final  . Neutro Abs 10/12/2017 4.7  1.5 - 6.5 K/uL Final  . Lymphocytes Relative 10/12/2017 22  % Final  . Lymphs Abs 10/12/2017 1.5  0.9 - 3.3 K/uL Final  . Monocytes Relative 10/12/2017 7  % Final  . Monocytes Absolute 10/12/2017 0.5  0.1 - 0.9 K/uL Final  . Eosinophils Relative 10/12/2017 3  % Final  . Eosinophils Absolute 10/12/2017 0.2  0.0 - 0.5 K/uL Final  . Basophils Relative 10/12/2017 1  % Final  . Basophils Absolute 10/12/2017 0.0  0.0 - 0.1 K/uL Final   Performed at [facility], [address] [place], [place] [address]    (this displays the last labs from the last 3 days)  No results found for: TOTALPROTELP, ALBUMINELP, A1GS, A2GS, BETS, BETA2SER, GAMS, MSPIKE, SPEI (this displays SPEP labs)  No results found for: KPAFRELGTCHN, LAMBDASER, KAPLAMBRATIO (kappa/lambda light chains)  No results found for: HGBA, HGBA2QUANT, HGBFQUANT, HGBSQUAN (Hemoglobinopathy evaluation)   No results found for: LDH  No results found for: IRON, TIBC, IRONPCTSAT (Iron and TIBC)  No results found for: FERRITIN  Urinalysis    Component Value Date/Time   COLORURINE Yellow 12/29/2008 0809   APPEARANCEUR CLEAR 12/29/2008 0809   LABSPEC 1.020 12/29/2008 0809   PHURINE 6.0 12/29/2008 0809   GLUCOSEU NEGATIVE 12/29/2008 0809   BILIRUBINUR NEGATIVE 12/29/2008 0809   KETONESUR NEGATIVE 12/29/2008 0809   UROBILINOGEN 0.2 12/29/2008 0809   NITRITE NEGATIVE 12/29/2008 0809   LEUKOCYTESUR NEGATIVE 12/29/2008 0809     STUDIES: Repeat mammography due early December  ELIGIBLE FOR AVAILABLE RESEARCH PROTOCOL: UPBEAT, ASA: Referral placed 02/28/2017  ASSESSMENT: 62 y.o. [place] woman s/p right breast lower inner quadrant biopsy  01/11/2017 for a cT1c cN0, stage IA invasive ductal breast cancer, estrogen receptor positive, progesterone receptor negativem HER-2 not amplified, with an Mib-1 of 20%  (1) genetics testing 03/22/2017 through the Hereditary Gene Panel offered by [facility] found no deleterious mutations in APC, ATM, AXIN2, BARD1, BMPR1A, BRCA1, BRCA2, BRIP1, CDH1, CDK4, CDKN2A (p14ARF), CDKN2A (p16INK4a), CHEK2, CTNNA1, DICER1, EPCAM (Deletion/duplication testing only), GREM1 (promoter region deletion/duplication testing only), KIT, MEN1, MLH1, MSH2, MSH3, MSH6, MUTYH, NBN, NF1, NHTL1, PALB2, PDGFRA, PMS2, POLD1, POLE, PTEN, RAD50, RAD51C, RAD51D, SDHB, SDHC, SDHD, SMAD4, SMARCA4. STK11, TP53, TSC1, TSC2, and VHL.  The following genes were evaluated for sequence changes only: SDHA and HOXB13 c.251G>A variant only.    (2) status post right lumpectomy and sentinel lymph node sampling 01/25/2017 for a pT1c pN0, stage Ia invasive ductal carcinoma, grade 2, with negative margins.  (3) Oncotype DX score of 42 predicts a 9-year risk of recurrence outside the breast of 30% if the patient's only systemic therapy is tamoxifen for 5 years.  It also predicts a significant chemotherapy benefit.  (4) adjuvant chemotherapy consisting of cyclophosphamide and doxorubicin in dose dense fashion x4 started 03/14/2017, completed 05/09/2017, followed by weekly paclitaxel x12 started 05/23/2017, completed 08/08/2017  (a) cycle 3 doxorubicin /cyclophosphamide delayed 1 week because of poor tolerance  (5) adjuvant radiation  08/31/2017 - 09/27/2017 Site/dose:    1. Right Breast / 40.05 Gy in 15 fractions 2. Right Breast Boost / 10 Gy in 5 fractions  (6) anastrozole started 10/12/2017  PLAN: I spent approximately 30 minutes face to face with Shelby Payne with more than 50%  of that time spent in counseling and coordination of care.  We reviewed her overall situation and she understands by receiving chemotherapy she reduced her risk significantly,  probably to less than 15%.  By taking antiestrogens she will cut the residual risk in half.  We discussed the difference between tamoxifen and anastrozole in detail. She understands that anastrozole and the aromatase inhibitors in general work by blocking estrogen production. Accordingly vaginal dryness, decrease in bone density, and of course hot flashes can result. The aromatase inhibitors can also negatively affect the cholesterol profile, although that is a minor effect. One out of 5 women on aromatase inhibitors we will feel "old and achy". This arthralgia/myalgia syndrome, which resembles fibromyalgia clinically, does resolve with stopping the medications. Accordingly this is not a reason to not try an aromatase inhibitor but it is a frequent reason to stop it (in other words 20% of women will not be able to tolerate these medications).  Tamoxifen on the other hand does not block estrogen production. It does not "take away a woman's estrogen". It blocks the estrogen receptor in breast cells. Like anastrozole, it can also cause hot flashes. As opposed to anastrozole, tamoxifen has many estrogen-like effects. It is technically an estrogen receptor modulator. This means that in some tissues tamoxifen works like estrogen-- for example it helps strengthen the bones. It tends to improve the cholesterol profile. It can cause thickening of the endometrial lining, and even endometrial polyps or rarely cancer of the uterus.(The risk of uterine cancer due to tamoxifen is one additional cancer per thousand women year). It can cause vaginal wetness or stickiness. It can cause blood clots through this estrogen-like effect--the risk of blood clots with tamoxifen is exactly the same as with birth control pills or hormone replacement.  Neither of these agents causes mood changes or weight gain, despite the popular belief that they can have these side effects. We have data from studies comparing either of these drugs with  placebo, and in those cases the control group had the same amount of weight gain and depression as the group that took the drug.  I think she would be a good candidate for anastrozole and I wrote that prescription for her.  I have set her up for a DEXA scan shortly before she returns to see me in December.  She will have mammography before that visit as well.  If all goes well the plan will be to continue anastrozole for total of 5 years.  She is ready to have her port removed and I will alert her surgeon regarding that.  I also wrote her a prescription to consider different bras to see if a sports bra at this point might be easier on her  Finally I encouraged her to sign up for "finding your new normal".  She knows to call for any other issues that may develop before the next visit here.      Michalla Ringer, Virgie Dad, MD  10/12/17 12:17 PM Medical Oncology and Hematology [facility] [address] [place], [place] [address] Tel. [phone]    Fax. [phone]  Alice Rieger, am acting as scribe for Chauncey Cruel MD.  I, Lurline Del MD, have reviewed the above documentation for accuracy and completeness, and I agree with the above.

## 2017-10-12 ENCOUNTER — Inpatient Hospital Stay: Payer: BC Managed Care – PPO | Attending: Oncology | Admitting: Oncology

## 2017-10-12 ENCOUNTER — Telehealth: Payer: Self-pay | Admitting: Oncology

## 2017-10-12 ENCOUNTER — Inpatient Hospital Stay: Payer: BC Managed Care – PPO

## 2017-10-12 VITALS — BP 130/79 | HR 82 | Temp 98.7°F | Resp 18 | Ht 64.0 in | Wt 236.8 lb

## 2017-10-12 DIAGNOSIS — Z170 Estrogen receptor positive status [ER+]: Secondary | ICD-10-CM | POA: Insufficient documentation

## 2017-10-12 DIAGNOSIS — I10 Essential (primary) hypertension: Secondary | ICD-10-CM | POA: Diagnosis not present

## 2017-10-12 DIAGNOSIS — E669 Obesity, unspecified: Secondary | ICD-10-CM

## 2017-10-12 DIAGNOSIS — Z79899 Other long term (current) drug therapy: Secondary | ICD-10-CM | POA: Insufficient documentation

## 2017-10-12 DIAGNOSIS — Z95828 Presence of other vascular implants and grafts: Secondary | ICD-10-CM

## 2017-10-12 DIAGNOSIS — C50311 Malignant neoplasm of lower-inner quadrant of right female breast: Secondary | ICD-10-CM | POA: Diagnosis not present

## 2017-10-12 DIAGNOSIS — F329 Major depressive disorder, single episode, unspecified: Secondary | ICD-10-CM | POA: Insufficient documentation

## 2017-10-12 DIAGNOSIS — Z79811 Long term (current) use of aromatase inhibitors: Secondary | ICD-10-CM | POA: Diagnosis not present

## 2017-10-12 DIAGNOSIS — F419 Anxiety disorder, unspecified: Secondary | ICD-10-CM | POA: Insufficient documentation

## 2017-10-12 LAB — CMP (CANCER CENTER ONLY)
ALBUMIN: 4.1 g/dL (ref 3.5–5.0)
ALT: 51 U/L — ABNORMAL HIGH (ref 0–44)
AST: 28 U/L (ref 15–41)
Alkaline Phosphatase: 72 U/L (ref 38–126)
Anion gap: 10 (ref 5–15)
BUN: 13 mg/dL (ref 8–23)
CO2: 28 mmol/L (ref 22–32)
Calcium: 10 mg/dL (ref 8.9–10.3)
Chloride: 101 mmol/L (ref 98–111)
Creatinine: 0.77 mg/dL (ref 0.44–1.00)
GFR, Est AFR Am: >60 mL/min (ref >60)
Glucose, Bld: 97 mg/dL (ref 70–99)
POTASSIUM: 4.2 mmol/L (ref 3.5–5.1)
Sodium: 139 mmol/L (ref 135–145)
Total Bilirubin: 0.4 mg/dL (ref 0.3–1.2)
Total Protein: 7.4 g/dL (ref 6.5–8.1)

## 2017-10-12 LAB — CBC WITH DIFFERENTIAL (CANCER CENTER ONLY)
Basophils Absolute: 0 10*3/uL (ref 0.0–0.1)
Basophils Relative: 1 %
EOS PCT: 3 %
Eosinophils Absolute: 0.2 10*3/uL (ref 0.0–0.5)
HEMATOCRIT: 38.8 % (ref 34.8–46.6)
HEMOGLOBIN: 13 g/dL (ref 11.6–15.9)
LYMPHS ABS: 1.5 10*3/uL (ref 0.9–3.3)
LYMPHS PCT: 22 %
MCH: 30.6 pg (ref 25.1–34.0)
MCHC: 33.5 g/dL (ref 31.5–36.0)
MCV: 91.3 fL (ref 79.5–101.0)
Monocytes Absolute: 0.5 10*3/uL (ref 0.1–0.9)
Monocytes Relative: 7 %
NEUTROS ABS: 4.7 10*3/uL (ref 1.5–6.5)
Neutrophils Relative %: 67 %
PLATELETS: 205 10*3/uL (ref 145–400)
RBC: 4.25 MIL/uL (ref 3.70–5.45)
RDW: 13 % (ref 11.2–14.5)
WBC: 7 10*3/uL (ref 3.9–10.3)

## 2017-10-12 MED ORDER — HEPARIN SOD (PORK) LOCK FLUSH 100 UNIT/ML IV SOLN
500.0000 [IU] | Freq: Once | INTRAVENOUS | Status: AC | PRN
  Administered 2017-10-12: 500 [IU]
  Filled 2017-10-12: qty 5

## 2017-10-12 MED ORDER — SODIUM CHLORIDE 0.9% FLUSH
10.0000 mL | INTRAVENOUS | Status: DC | PRN
  Administered 2017-10-12: 10 mL
  Filled 2017-10-12: qty 10

## 2017-10-12 MED ORDER — ANASTROZOLE 1 MG PO TABS: 1.0000 mg | ORAL_TABLET | Freq: Every day | ORAL | 4 refills | Status: DC

## 2017-10-12 NOTE — Telephone Encounter (Signed)
Gave pt avs and calendar  °

## 2017-10-16 ENCOUNTER — Encounter: Payer: Self-pay | Admitting: *Deleted

## 2017-10-17 ENCOUNTER — Telehealth: Payer: Self-pay | Admitting: Oncology

## 2017-10-17 NOTE — Telephone Encounter (Signed)
Appt scheduled patient notified per 9/9 sch msg

## 2017-10-25 ENCOUNTER — Encounter: Payer: Self-pay | Admitting: Radiation Oncology

## 2017-10-25 NOTE — Progress Notes (Signed)
Ms. Soper presents for follow up of radiation completed 09/27/17 to her Right Breast. She saw Dr. Jana Hakim on 10/12/17 and began taking Anastrozole that day. Her next apt with Dr. Jana Hakim is 01/16/18 and she will see survivorship on 04/26/18. Her right breast has healed well. She does have dryness/ peeling to her right nipple. She continues to use radiaplex as directed and will use a vitamin E containing lotion or oil when the radiaplex is completed. She reports fatigue that continues and has caused sleep disturbances.   BP 130/76 (BP Location: Left Arm, Patient Position: Sitting)   Pulse 85   Temp 99.1 F (37.3 C) (Oral)   Resp 18   Ht 5\' 4"  (1.626 m)   Wt 234 lb 4 oz (106.3 kg)   LMP 07/12/2011   SpO2 96%   BMI 40.21 kg/m    Wt Readings from Last 3 Encounters:  11/01/17 234 lb 4 oz (106.3 kg)  10/12/17 236 lb 12.8 oz (107.4 kg)  08/22/17 242 lb 12.8 oz (110.1 kg)

## 2017-11-01 ENCOUNTER — Encounter: Payer: Self-pay | Admitting: Radiation Oncology

## 2017-11-01 ENCOUNTER — Ambulatory Visit
Admission: RE | Admit: 2017-11-01 | Discharge: 2017-11-01 | Disposition: A | Discharge status: Home / Self Care | Payer: BC Managed Care – PPO | Source: Ambulatory Visit | Attending: Radiation Oncology | Admitting: Radiation Oncology

## 2017-11-01 ENCOUNTER — Other Ambulatory Visit: Payer: Self-pay

## 2017-11-01 VITALS — BP 130/76 | HR 85 | Temp 99.1°F | Resp 18 | Ht 64.0 in | Wt 234.2 lb

## 2017-11-01 DIAGNOSIS — Z923 Personal history of irradiation: Secondary | ICD-10-CM | POA: Diagnosis not present

## 2017-11-01 DIAGNOSIS — G4700 Insomnia, unspecified: Secondary | ICD-10-CM | POA: Diagnosis not present

## 2017-11-01 DIAGNOSIS — Z170 Estrogen receptor positive status [ER+]: Secondary | ICD-10-CM | POA: Diagnosis not present

## 2017-11-01 DIAGNOSIS — C50311 Malignant neoplasm of lower-inner quadrant of right female breast: Secondary | ICD-10-CM | POA: Insufficient documentation

## 2017-11-01 DIAGNOSIS — Z79811 Long term (current) use of aromatase inhibitors: Secondary | ICD-10-CM | POA: Diagnosis not present

## 2017-11-01 DIAGNOSIS — Z79899 Other long term (current) drug therapy: Secondary | ICD-10-CM | POA: Diagnosis not present

## 2017-11-01 HISTORY — DX: Personal history of irradiation: Z92.3

## 2017-11-01 NOTE — Progress Notes (Signed)
  Radiation Oncology         [phone] ________________________________  Name: Shelby Payne MRN: [id number]  Date: 11/01/2017  DOB: 09/24/1955  Follow-Up Visit Note  Outpatient  CC: Hoyt Koch, MD  Pricilla Holm A, *  Diagnosis and Prior Radiotherapy:    ICD-10-CM   1. Malignant neoplasm of lower-inner quadrant of right breast of female, estrogen receptor positive ([place]) C50.311    Z17.0     CHIEF COMPLAINT: Here for follow-up and surveillance of breast cancer  Narrative:  The patient returns today for routine follow-up.  She completed RT a month ago to her right breast. She is doing well except for insomnia at times.  She reports healing of her skin over her right breast. Taking anastrozole.                               ALLERGIES:  is allergic to hctz [hydrochlorothiazide] and bactrim [sulfamethoxazole-trimethoprim].  Meds: Current Outpatient Medications  Medication Sig Dispense Refill  . anastrozole (ARIMIDEX) 1 MG tablet Take 1 tablet (1 mg total) by mouth daily. 90 tablet 4  . b complex vitamins tablet Take 1 tablet by mouth daily.    . Coenzyme Q10 (CO Q-10 PO) Take 1 capsule by mouth daily.     . metoprolol succinate (TOPROL-XL) 100 MG 24 hr tablet Take 1 tablet (100 mg total) daily by mouth. 90 tablet 3  . Omega-3 Fatty Acids (FISH OIL PO) Take 1 capsule by mouth daily.     . sertraline (ZOLOFT) 100 MG tablet Take 1 tablet (100 mg total) daily by mouth. 90 tablet 3  . Telmisartan-Amlodipine 40-5 MG TABS Take 1 tablet daily by mouth. 90 tablet 3  . triamcinolone cream (KENALOG) 0.1 % Apply 1 application 2 (two) times daily topically. (Patient taking differently: Apply 1 application topically 2 (two) times daily as needed (for rash). ) 30 g 6   No current facility-administered medications for this encounter.     Physical Findings: The patient is in no acute distress. Patient is alert and oriented.  height is 5\' 4"  (1.626 m) and weight is 234 lb 4  oz (106.3 kg). Her oral temperature is 99.1 F (37.3 C). Her blood pressure is 130/76 and her pulse is 85. Her respiration is 18 and oxygen saturation is 96%. .    Satisfactory skin healing in radiotherapy fields. Mild erythema of breast and and resolving dryness (nipple, right)  Lab Findings: Lab Results  Component Value Date   WBC 7.0 10/12/2017   HGB 13.0 10/12/2017   HCT 38.8 10/12/2017   MCV 91.3 10/12/2017   PLT 205 10/12/2017    Radiographic Findings: No results found.  Impression/Plan: Healing well from radiotherapy to the breast tissue.  Continue skin care with topical Vitamin E Oil and / or lotion for at least 2 more months for further healing.  I encouraged her to continue with yearly mammography as appropriate (for intact breast tissue) and followup with medical oncology. I will see her back on an as-needed basis. I have encouraged her to call if she has any issues or concerns in the future. I wished her the very best.   _____________________________________   Eppie Gibson, MD

## 2017-12-11 ENCOUNTER — Ambulatory Visit: Payer: Self-pay | Admitting: Surgery

## 2017-12-16 ENCOUNTER — Other Ambulatory Visit: Payer: Self-pay | Admitting: Internal Medicine

## 2017-12-24 ENCOUNTER — Other Ambulatory Visit: Payer: Self-pay | Admitting: Oncology

## 2017-12-24 DIAGNOSIS — C50311 Malignant neoplasm of lower-inner quadrant of right female breast: Secondary | ICD-10-CM

## 2017-12-24 DIAGNOSIS — Z170 Estrogen receptor positive status [ER+]: Principal | ICD-10-CM

## 2018-01-15 NOTE — Progress Notes (Signed)
Shelby Payne  [phone] [phone]) [phone]     ID: Shelby Payne DOB: Jul 19, 1955  MR#: [id number]  PPI#:[id number]  Patient Care Team: Shelby Koch, MD as PCP - General (Internal Medicine) Payne, Shelby Dad, MD as Consulting Physician (Oncology) Shelby Gibson, MD as Attending Physician (Radiation Oncology) Shelby Luna, MD as Consulting Physician (General Surgery) Shelby Slates, MD as Physician Assistant (Radiology) OTHER MD:  CHIEF COMPLAINT: Estrogen receptor positive breast cancer  CURRENT TREATMENT: Anastrozole   HISTORY OF CURRENT ILLNESS: From the original intake note:  The patient had bilateral screening mammography at [facility] 12/28/2016.  There was a new mass in the right breast lower inner quadrant and some new grouped calcifications in the right breast same quadrant.  Accordingly the patient was called back for right diagnostic mammography and ultrasonography 01/09/2017.  The breast density was category A.  In the right breast lower inner quadrant there was a new irregular mass.  Again noted was a new group of heterogeneous calcifications in the same quadrant.  Ultrasound confirmed a 1.7 cm irregular mass in the right breast lower inner quadrant.  There were no abnormalities sonographically in the right axilla.  Taken together,  the mass plus calcifications measured up to 4 cm.  Accordingly on 01/11/2017 she underwent biopsy of the right breast mass in question, showing [phone]) invasive ductal carcinoma, grade 2, estrogen receptor 90% positive with strong staining intensity, progesterone receptor negative, with an Mib-1 of 20% and no HER-2 amplification, the signals ratio being 1.26 and the number per cell 2.20  The patient's subsequent history is as detailed below.  INTERVAL HISTORY: Shelby Payne returns today for follow-up of her estrogen receptor positive breast cancer. She is accompanied by her husband.  The patient continues on  anastrozole, which she is tolerating well. She has no hot flashes and no vaginal dryness.   Since her last visit here, she underwent a bone density screening on 01/10/2018, showed a T-score of -0.9, which is considered normal.   She also underwent a 3D diagnostic bilateral mammogram on 01/10/2018 showing: Breast Density Category A. There is no mammographic evidence of malignancy.    REVIEW OF SYSTEMS: Shelby Payne is doing well overall. She retired; she misses the work, but is glad overall that she went ahead and retired. She will consider going back part time. She is getting out of bed around 9:30 am usually. She is sleeping generally well, and will usually only wake up once to use the bathroom. The patient denies unusual headaches, visual changes, nausea, vomiting, or dizziness. There has been no unusual cough, phlegm production, or pleurisy. This been no change in bowel or bladder habits. The patient denies unexplained fatigue or unexplained weight loss, bleeding, rash, or fever. A detailed review of systems was otherwise noncontributory.    PAST MEDICAL HISTORY: Past Medical History:  Diagnosis Date  . Acute sinusitis, unspecified   . Anxiety   . Cancer ([place]) 12/2016   right breast cancer  . Depression   . Family history of prostate cancer   . History of radiation therapy 08/31/17- 09/27/17   Right Breast/ 40.05 Gy in 15 fractions, Right Breast Boost/ 10 Gy in 5 fractions.   . Hypertension   . Obesity, unspecified   . Pneumonia, organism unspecified(486)     PAST SURGICAL HISTORY: Past Surgical History:  Procedure Laterality Date  . BREAST LUMPECTOMY WITH RADIOACTIVE SEED AND SENTINEL LYMPH NODE BIOPSY Right 01/25/2017   Procedure: RIGHT BREAST LUMPECTOMY  WITH 2 RADIOACTIVE SEEDS  AND SENTINEL LYMPH NODE BIOPSY;  Surgeon: Shelby Luna, MD;  Location: [place];  Service: General;  Laterality: Right;  . PORTACATH PLACEMENT Right 03/13/2017   Procedure: ULTRASOUND GUIDED  INSERTION PORT-A-CATH RIGHT INTERNAL JUGULAR;  Surgeon: Shelby Luna, MD;  Location: [place];  Service: General;  Laterality: Right;  . WISDOM TOOTH EXTRACTION      FAMILY HISTORY Family History  Problem Relation Age of Onset  . Hypertension Mother   . Hypothyroidism Mother   . Hyperlipidemia Mother   . Gout Mother   . Breast cancer Mother 34  . Heart disease Father   . Hypertension Father   . Diabetes Father   . Hypothyroidism Father   . Prostate cancer Father        dx late 29s  . Hypothyroidism Sister   . Cancer Sister        angiosarcoma - mets  . Esophageal cancer Maternal Uncle   . Stroke Paternal Aunt   . Stroke Paternal Uncle   . Heart attack Maternal Grandfather   . ALS Paternal Grandfather   . COPD Neg Hx   . Colon cancer Neg Hx   . Rectal cancer Neg Hx   . Stomach cancer Neg Hx   Her father passed away from a massive stroke at 24. Her mother passed away from complications of breast cancer at 68. She had been diagnosed age 52. The patient's sister had angiosarcoma at 62. There is no otherhistory of breast or ovarian cancer in this famiy. The patient is of Ashkenazi extraction  GYNECOLOGIC HISTORY:  Patient's last menstrual period was 07/12/2011.  Menarche age 55, first live birth age 27, she is GXP2. Menopause 2015, never used OCPs or HR   SOCIAL HISTORY: (updated 01/16/2018) She is a retired Electrical engineer at [employer]. Her husband, Shelby Payne was a Curator at [employer] in [place]' Sports coach, but is retired now. Her daughter Shelby Payne teaches nursing at [employer]. Shelby Payne is concerned that on her father's side a cousin has tested positive for a deleterious gene; she will try to bring [name] that information]. Son, Shelby Payne passed away. The patient does not have any grandchildren. She attends [employer].     ADVANCED DIRECTIVES:    HEALTH MAINTENANCE: Social History   Tobacco Use  . Smoking status: Never Smoker  . Smokeless tobacco: Never  Used  Substance Use Topics  . Alcohol use: Yes    Comment: rare glass of wine  . Drug use: No     Colonoscopy: 2013  PAP:  Bone density: Due December 2019 at [place] Reactions  . Hctz [Hydrochlorothiazide] Other (See Comments)    Hyponatremia  . Bactrim [Sulfamethoxazole-Trimethoprim] Rash    Current Outpatient Medications  Medication Sig Dispense Refill  . anastrozole (ARIMIDEX) 1 MG tablet Take 1 tablet (1 mg total) by mouth daily. 90 tablet 4  . b complex vitamins tablet Take 1 tablet by mouth daily.    . Coenzyme Q10 (CO Q-10 PO) Take 1 capsule by mouth daily.     . metoprolol succinate (TOPROL-XL) 100 MG 24 hr tablet Take 1 tablet (100 mg total) by mouth daily. Need annual appointment for further refills 90 tablet 0  . Omega-3 Fatty Acids (FISH OIL PO) Take 1 capsule by mouth daily.     . sertraline (ZOLOFT) 100 MG tablet Take 1 tablet (100 mg total) by mouth daily. Need annual appointment for further refills 90 tablet 0  . Telmisartan-Amlodipine  40-5 MG TABS Take 1 tablet daily by mouth. 90 tablet 3  . triamcinolone cream (KENALOG) 0.1 % Apply 1 application 2 (two) times daily topically. (Patient taking differently: Apply 1 application topically 2 (two) times daily as needed (for rash). ) 30 g 6   No current facility-administered medications for this visit.     OBJECTIVE: Middle-aged white woman who appears well  Vitals:   01/16/18 1420  BP: 132/83  Pulse: 80  Resp: 18  Temp: 99.2 F (37.3 C)  SpO2: 97%     Body mass index is 40.01 kg/m.   Wt Readings from Last 3 Encounters:  01/16/18 233 lb 1.6 oz (105.7 kg)  11/01/17 234 lb 4 oz (106.3 kg)  10/12/17 236 lb 12.8 oz (107.4 kg)  ECOG FS:1 - Symptomatic but completely ambulatory  Sclerae unicteric, pupils round and equal No cervical or supraclavicular adenopathy Lungs no rales or rhonchi Heart regular rate and rhythm Abd soft, nontender, positive bowel sounds MSK no focal spinal  tenderness, no upper extremity lymphedema Neuro: nonfocal, well oriented, appropriate affect Breasts: The right breast has undergone lumpectomy and radiation.  The cosmetic result is excellent.  The main incision is practically invisible.  The volume of the breast is also well preserved.  There is no evidence of local recurrence.  The left breast is benign.  Both axillae are benign.  LAB RESULTS:  CMP     Component Value Date/Time   NA 137 01/16/2018 1355   NA 137 01/18/2017 1238   K 4.1 01/16/2018 1355   K 4.1 01/18/2017 1238   CL 103 01/16/2018 1355   CO2 22 01/16/2018 1355   CO2 22 01/18/2017 1238   GLUCOSE 120 (H) 01/16/2018 1355   GLUCOSE 91 01/18/2017 1238   BUN 11 01/16/2018 1355   BUN 19.5 01/18/2017 1238   CREATININE 0.85 01/16/2018 1355   CREATININE 0.7 01/18/2017 1238   CALCIUM 9.8 01/16/2018 1355   CALCIUM 9.8 01/18/2017 1238   PROT 7.4 01/16/2018 1355   PROT 7.9 01/18/2017 1238   ALBUMIN 4.0 01/16/2018 1355   ALBUMIN 4.3 01/18/2017 1238   AST 32 01/16/2018 1355   AST 19 01/18/2017 1238   ALT 53 (H) 01/16/2018 1355   ALT 24 01/18/2017 1238   ALKPHOS 63 01/16/2018 1355   ALKPHOS 53 01/18/2017 1238   BILITOT 0.5 01/16/2018 1355   BILITOT 0.39 01/18/2017 1238   GFRNONAA >60 01/16/2018 1355   GFRAA >60 01/16/2018 1355    No results found for: TOTALPROTELP, ALBUMINELP, A1GS, A2GS, BETS, BETA2SER, GAMS, MSPIKE, SPEI  No results found for: KPAFRELGTCHN, LAMBDASER, KAPLAMBRATIO  Lab Results  Component Value Date   WBC 7.2 01/16/2018   NEUTROABS 5.1 01/16/2018   HGB 13.4 01/16/2018   HCT 40.0 01/16/2018   MCV 88.3 01/16/2018   PLT 215 01/16/2018    '@LASTCHEMISTRY'$ @  No results found for: LABCA2  No components found for: [id number]  No results for input(s): INR in the last 168 hours.  No results found for: LABCA2  No results found for: MOQ947  No results found for: MLY650  No results found for: PTW656  No results found for: CA2729  No components  found for: HGQUANT  No results found for: CEA1 / No results found for: CEA1   No results found for: AFPTUMOR  No results found for: CHROMOGRNA  No results found for: PSA1  Appointment on 01/16/2018  Component Date Value Ref Range Status  . WBC Count 01/16/2018 7.2  4.0 - 10.5  K/uL Final  . RBC 01/16/2018 4.53  3.87 - 5.11 MIL/uL Final  . Hemoglobin 01/16/2018 13.4  12.0 - 15.0 g/dL Final  . HCT 01/16/2018 40.0  36.0 - 46.0 % Final  . MCV 01/16/2018 88.3  80.0 - 100.0 fL Final  . MCH 01/16/2018 29.6  26.0 - 34.0 pg Final  . MCHC 01/16/2018 33.5  30.0 - 36.0 g/dL Final  . RDW 01/16/2018 13.2  11.5 - 15.5 % Final  . Platelet Count 01/16/2018 215  150 - 400 K/uL Final  . nRBC 01/16/2018 0.0  0.0 - 0.2 % Final  . Neutrophils Relative % 01/16/2018 71  % Final  . Neutro Abs 01/16/2018 5.1  1.7 - 7.7 K/uL Final  . Lymphocytes Relative 01/16/2018 20  % Final  . Lymphs Abs 01/16/2018 1.4  0.7 - 4.0 K/uL Final  . Monocytes Relative 01/16/2018 6  % Final  . Monocytes Absolute 01/16/2018 0.4  0.1 - 1.0 K/uL Final  . Eosinophils Relative 01/16/2018 3  % Final  . Eosinophils Absolute 01/16/2018 0.2  0.0 - 0.5 K/uL Final  . Basophils Relative 01/16/2018 0  % Final  . Basophils Absolute 01/16/2018 0.0  0.0 - 0.1 K/uL Final  . Immature Granulocytes 01/16/2018 0  % Final  . Abs Immature Granulocytes 01/16/2018 0.02  0.00 - 0.07 K/uL Final   Performed at [facility], [place] [address], [place], [place] [address]  . Sodium 01/16/2018 137  135 - 145 mmol/L Final  . Potassium 01/16/2018 4.1  3.5 - 5.1 mmol/L Final  . Chloride 01/16/2018 103  98 - 111 mmol/L Final  . CO2 01/16/2018 22  22 - 32 mmol/L Final  . Glucose, Bld 01/16/2018 120* 70 - 99 mg/dL Final  . BUN 01/16/2018 11  8 - 23 mg/dL Final  . Creatinine 01/16/2018 0.85  0.44 - 1.00 mg/dL Final  . Calcium 01/16/2018 9.8  8.9 - 10.3 mg/dL Final  . Total Protein 01/16/2018 7.4  6.5 - 8.1 g/dL Final  . Albumin  01/16/2018 4.0  3.5 - 5.0 g/dL Final  . AST 01/16/2018 32  15 - 41 U/L Final  . ALT 01/16/2018 53* 0 - 44 U/L Final  . Alkaline Phosphatase 01/16/2018 63  38 - 126 U/L Final  . Total Bilirubin 01/16/2018 0.5  0.3 - 1.2 mg/dL Final  . GFR, Est Non Af Am 01/16/2018 >60  >60 mL/min Final  . GFR, Est AFR Am 01/16/2018 >60  >60 mL/min Final  . Anion gap 01/16/2018 12  5 - 15 Final   Performed at [facility], [place] [address] [place],  [address]    (this displays the last labs from the last 3 days)  No results found for: TOTALPROTELP, ALBUMINELP, A1GS, A2GS, BETS, BETA2SER, GAMS, MSPIKE, SPEI (this displays SPEP labs)  No results found for: KPAFRELGTCHN, LAMBDASER, KAPLAMBRATIO (kappa/lambda light chains)  No results found for: HGBA, HGBA2QUANT, HGBFQUANT, HGBSQUAN (Hemoglobinopathy evaluation)   No results found for: LDH  No results found for: IRON, TIBC, IRONPCTSAT (Iron and TIBC)  No results found for: FERRITIN  Urinalysis    Component Value Date/Time   COLORURINE Yellow 12/29/2008 0809   APPEARANCEUR CLEAR 12/29/2008 0809   LABSPEC 1.020 12/29/2008 0809   PHURINE 6.0 12/29/2008 0809   GLUCOSEU NEGATIVE 12/29/2008 0809   BILIRUBINUR NEGATIVE 12/29/2008 0809   KETONESUR NEGATIVE 12/29/2008 0809   UROBILINOGEN 0.2 12/29/2008 0809   NITRITE NEGATIVE 12/29/2008 0809   LEUKOCYTESUR NEGATIVE 12/29/2008 0809  STUDIES: Bone density results discussed with the patient  ELIGIBLE FOR AVAILABLE RESEARCH PROTOCOL: UPBEAT, ASA: Referral placed 02/28/2017   ASSESSMENT: 62 y.o.  woman s/p right breast lower inner quadrant biopsy 01/11/2017 for a cT1c cN0, stage IA invasive ductal breast cancer, estrogen receptor positive, progesterone receptor negativem HER-2 not amplified, with an Mib-1 of 20%  (1) genetics testing 03/22/2017 through the Hereditary Gene Panel offered by [facility] found no deleterious mutations in APC, ATM, AXIN2, BARD1,  BMPR1A, BRCA1, BRCA2, BRIP1, CDH1, CDK4, CDKN2A (p14ARF), CDKN2A (p16INK4a), CHEK2, CTNNA1, DICER1, EPCAM (Deletion/duplication testing only), GREM1 (promoter region deletion/duplication testing only), KIT, MEN1, MLH1, MSH2, MSH3, MSH6, MUTYH, NBN, NF1, NHTL1, PALB2, PDGFRA, PMS2, POLD1, POLE, PTEN, RAD50, RAD51C, RAD51D, SDHB, SDHC, SDHD, SMAD4, SMARCA4. STK11, TP53, TSC1, TSC2, and VHL.  The following genes were evaluated for sequence changes only: SDHA and HOXB13 c.251G>A variant only.    (2) status post right lumpectomy and sentinel lymph node sampling 01/25/2017 for a pT1c pN0, stage Ia invasive ductal carcinoma, grade 2, with negative margins.  (3) Oncotype DX score of 42 predicts a 9-year risk of recurrence outside the breast of 30% if the patient's only systemic therapy is tamoxifen for 5 years.  It also predicts a significant chemotherapy benefit.  (4) adjuvant chemotherapy consisting of cyclophosphamide and doxorubicin in dose dense fashion x4 started 03/14/2017, completed 05/09/2017, followed by weekly paclitaxel x12 started 05/23/2017, completed 08/08/2017  (a) cycle 3 doxorubicin /cyclophosphamide delayed 1 week because of poor tolerance  (5) adjuvant radiation  08/31/2017 - 09/27/2017 Site/dose:    1. Right Breast / 40.05 Gy in 15 fractions 2. Right Breast Boost / 10 Gy in 5 fractions  (6) anastrozole started 10/12/2017  (a) [facility] at [facility] 01/10/2018 was normal  PLAN: Harriette has recovered very nicely from her treatments, although she is not quite back to 100% yet.  She is still having a little bit of altered sleep but hopefully this will improve over the next several months  She and her husband are planning to move to [place] to be closer to their daughter who is planning a family.  If they decide to change to [facility] we will be glad to help them find an oncologist there but at this point they are thinking of continuing to keep their medical care in [place]  She is  tolerating anastrozole well.  Given the very high Oncotype score her tumor exhibited I would recommend continuing the anastrozole for a total of 7 years.  She will be ready for repeat bone density 2 years from now  She knows to call for any other issues that may develop before the next visit.  Payne, Shelby Dad, MD  01/16/18 2:57 PM Medical Oncology and Hematology [facility] [address] [place], [place] [address] Tel. [phone]    Fax. [phone]   I, Jacqualyn Posey am acting as a Education administrator for Chauncey Cruel, MD.   I, Lurline Del MD, have reviewed the above documentation for accuracy and completeness, and I agree with the above.

## 2018-01-16 ENCOUNTER — Inpatient Hospital Stay: Payer: BC Managed Care – PPO | Admitting: Oncology

## 2018-01-16 ENCOUNTER — Inpatient Hospital Stay: Payer: BC Managed Care – PPO | Attending: Oncology

## 2018-01-16 VITALS — BP 132/83 | HR 80 | Temp 99.2°F | Resp 18 | Ht 64.0 in | Wt 233.1 lb

## 2018-01-16 DIAGNOSIS — I10 Essential (primary) hypertension: Secondary | ICD-10-CM | POA: Insufficient documentation

## 2018-01-16 DIAGNOSIS — Z79811 Long term (current) use of aromatase inhibitors: Secondary | ICD-10-CM | POA: Diagnosis not present

## 2018-01-16 DIAGNOSIS — Z170 Estrogen receptor positive status [ER+]: Secondary | ICD-10-CM | POA: Diagnosis not present

## 2018-01-16 DIAGNOSIS — Z79899 Other long term (current) drug therapy: Secondary | ICD-10-CM | POA: Diagnosis not present

## 2018-01-16 DIAGNOSIS — Z9221 Personal history of antineoplastic chemotherapy: Secondary | ICD-10-CM | POA: Diagnosis not present

## 2018-01-16 DIAGNOSIS — Z923 Personal history of irradiation: Secondary | ICD-10-CM

## 2018-01-16 DIAGNOSIS — C50311 Malignant neoplasm of lower-inner quadrant of right female breast: Secondary | ICD-10-CM

## 2018-01-16 LAB — CMP (CANCER CENTER ONLY)
ALT: 53 U/L — AB (ref 0–44)
ANION GAP: 12 (ref 5–15)
AST: 32 U/L (ref 15–41)
Albumin: 4 g/dL (ref 3.5–5.0)
Alkaline Phosphatase: 63 U/L (ref 38–126)
BUN: 11 mg/dL (ref 8–23)
CALCIUM: 9.8 mg/dL (ref 8.9–10.3)
CHLORIDE: 103 mmol/L (ref 98–111)
CO2: 22 mmol/L (ref 22–32)
CREATININE: 0.85 mg/dL (ref 0.44–1.00)
GFR, Estimated: >60 mL/min (ref >60)
Glucose, Bld: 120 mg/dL — ABNORMAL HIGH (ref 70–99)
Potassium: 4.1 mmol/L (ref 3.5–5.1)
SODIUM: 137 mmol/L (ref 135–145)
Total Bilirubin: 0.5 mg/dL (ref 0.3–1.2)
Total Protein: 7.4 g/dL (ref 6.5–8.1)

## 2018-01-16 LAB — CBC WITH DIFFERENTIAL (CANCER CENTER ONLY)
ABS IMMATURE GRANULOCYTES: 0.02 10*3/uL (ref 0.00–0.07)
BASOS PCT: 0 %
Basophils Absolute: 0 10*3/uL (ref 0.0–0.1)
EOS ABS: 0.2 10*3/uL (ref 0.0–0.5)
EOS PCT: 3 %
HCT: 40 % (ref 36.0–46.0)
Hemoglobin: 13.4 g/dL (ref 12.0–15.0)
Immature Granulocytes: 0 %
Lymphocytes Relative: 20 %
Lymphs Abs: 1.4 10*3/uL (ref 0.7–4.0)
MCH: 29.6 pg (ref 26.0–34.0)
MCHC: 33.5 g/dL (ref 30.0–36.0)
MCV: 88.3 fL (ref 80.0–100.0)
MONO ABS: 0.4 10*3/uL (ref 0.1–1.0)
MONOS PCT: 6 %
NEUTROS ABS: 5.1 10*3/uL (ref 1.7–7.7)
NEUTROS PCT: 71 %
NRBC: 0 % (ref 0.0–0.2)
PLATELETS: 215 10*3/uL (ref 150–400)
RBC: 4.53 MIL/uL (ref 3.87–5.11)
RDW: 13.2 % (ref 11.5–15.5)
WBC Count: 7.2 10*3/uL (ref 4.0–10.5)

## 2018-01-16 MED ORDER — ANASTROZOLE 1 MG PO TABS: 1.0000 mg | ORAL_TABLET | Freq: Every day | ORAL | 4 refills | Status: DC

## 2018-02-18 ENCOUNTER — Other Ambulatory Visit: Payer: Self-pay | Admitting: Internal Medicine

## 2018-02-18 DIAGNOSIS — I10 Essential (primary) hypertension: Secondary | ICD-10-CM

## 2018-03-18 ENCOUNTER — Other Ambulatory Visit: Payer: Self-pay | Admitting: Internal Medicine

## 2018-03-18 DIAGNOSIS — I10 Essential (primary) hypertension: Secondary | ICD-10-CM

## 2018-03-27 ENCOUNTER — Other Ambulatory Visit: Payer: Self-pay | Admitting: Internal Medicine

## 2018-03-27 DIAGNOSIS — I10 Essential (primary) hypertension: Secondary | ICD-10-CM

## 2018-03-27 MED ORDER — METOPROLOL SUCCINATE ER 100 MG PO TB24: 100.0000 mg | ORAL_TABLET | Freq: Every day | ORAL | 0 refills | Status: DC

## 2018-03-27 MED ORDER — TELMISARTAN-AMLODIPINE 40-5 MG PO TABS: 1.0000 | ORAL_TABLET | Freq: Every day | ORAL | 0 refills | Status: DC

## 2018-03-27 MED ORDER — SERTRALINE HCL 100 MG PO TABS: 100.0000 mg | ORAL_TABLET | Freq: Every day | ORAL | 0 refills | Status: DC

## 2018-03-27 NOTE — Telephone Encounter (Signed)
30 Day courtesy refill. Pt has appt 04/02/2018 Dr. Sharlet Salina

## 2018-03-27 NOTE — Telephone Encounter (Signed)
Copied from [place] [id number]. Topic: Quick Communication - Rx Refill/Question >> Mar 27, 2018  4:54 PM Bea Graff, NT wrote: Medication: metoprolol succinate (TOPROL-XL) 100 MG 24 hr tablet, sertraline (ZOLOFT) 100 MG tablet, Telmisartan-amLODIPine 40-5 MG TABS  Pt will be in for a physical on 04/02/2018 but will be out before then.  Has the patient contacted their pharmacy? Yes.   (Agent: If no, request that the patient contact the pharmacy for the refill.) (Agent: If yes, when and what did the pharmacy advise?)  Preferred Pharmacy (with phone number or street name): [facility] [place], [address] AT [address] [phone] (Phone) [phone] (Fax)    Agent: Please be advised that RX refills may take up to 3 business days. We ask that you follow-up with your pharmacy.

## 2018-04-02 ENCOUNTER — Ambulatory Visit (INDEPENDENT_AMBULATORY_CARE_PROVIDER_SITE_OTHER): Payer: BC Managed Care – PPO | Admitting: Internal Medicine

## 2018-04-02 ENCOUNTER — Encounter: Payer: Self-pay | Admitting: Internal Medicine

## 2018-04-02 ENCOUNTER — Other Ambulatory Visit (INDEPENDENT_AMBULATORY_CARE_PROVIDER_SITE_OTHER): Payer: BC Managed Care – PPO

## 2018-04-02 VITALS — BP 138/86 | HR 79 | Temp 98.6°F | Ht 64.0 in | Wt 231.0 lb

## 2018-04-02 DIAGNOSIS — Z23 Encounter for immunization: Secondary | ICD-10-CM

## 2018-04-02 DIAGNOSIS — R739 Hyperglycemia, unspecified: Secondary | ICD-10-CM

## 2018-04-02 DIAGNOSIS — F320 Major depressive disorder, single episode, mild: Secondary | ICD-10-CM | POA: Diagnosis not present

## 2018-04-02 DIAGNOSIS — Z170 Estrogen receptor positive status [ER+]: Secondary | ICD-10-CM

## 2018-04-02 DIAGNOSIS — Z0000 Encounter for general adult medical examination without abnormal findings: Secondary | ICD-10-CM

## 2018-04-02 DIAGNOSIS — C50311 Malignant neoplasm of lower-inner quadrant of right female breast: Secondary | ICD-10-CM | POA: Diagnosis not present

## 2018-04-02 LAB — LIPID PANEL
Cholesterol: 190 mg/dL (ref 0–200)
HDL: 45.5 mg/dL (ref >39.00)
LDL Cholesterol: 118 mg/dL — ABNORMAL HIGH (ref 0–99)
NonHDL: 144.48
TRIGLYCERIDES: 133 mg/dL (ref 0.0–149.0)
Total CHOL/HDL Ratio: 4
VLDL: 26.6 mg/dL (ref 0.0–40.0)

## 2018-04-02 LAB — HEMOGLOBIN A1C: Hgb A1c MFr Bld: 5.4 % (ref 4.6–6.5)

## 2018-04-02 NOTE — Assessment & Plan Note (Signed)
Flu shot given. Shingrix needs to address. Tetanus up to date. Colonoscopy up to date. Mammogram up to date, pap smear up to date with gyn. Counseled about sun safety and mole surveillance. Counseled about the dangers of distracted driving. Given 10 year screening recommendations.

## 2018-04-02 NOTE — Assessment & Plan Note (Signed)
Several high sugars in recent labs and checking HgA1c

## 2018-04-02 NOTE — Progress Notes (Signed)
   Subjective:   Patient ID: Shelby Payne, female    DOB: 1955/10/03, 62 y.o.   MRN: [id number]  HPI The patient is a 63 YO female coming in for physical. New breast cancer since last visit.   PMH, [facility], social history reviewed and updated.   Review of Systems  Constitutional: Negative.   HENT: Negative.   Eyes: Negative.   Respiratory: Negative for cough, chest tightness and shortness of breath.   Cardiovascular: Negative for chest pain, palpitations and leg swelling.  Gastrointestinal: Negative for abdominal distention, abdominal pain, constipation, diarrhea, nausea and vomiting.  Musculoskeletal: Negative.   Skin: Negative.   Neurological: Negative.   Psychiatric/Behavioral: Negative.     Objective:  Physical Exam Constitutional:      Appearance: She is well-developed.  HENT:     Head: Normocephalic and atraumatic.  Neck:     Musculoskeletal: Normal range of motion.  Cardiovascular:     Rate and Rhythm: Normal rate and regular rhythm.  Pulmonary:     Effort: Pulmonary effort is normal. No respiratory distress.     Breath sounds: Normal breath sounds. No wheezing or rales.  Abdominal:     General: Bowel sounds are normal. There is no distension.     Palpations: Abdomen is soft.     Tenderness: There is no abdominal tenderness. There is no rebound.  Skin:    General: Skin is warm and dry.  Neurological:     Mental Status: She is alert and oriented to person, place, and time.     Coordination: Coordination normal.     Vitals:   04/02/18 1029  BP: 138/86  Pulse: 79  Temp: 98.6 F (37 C)  TempSrc: Oral  SpO2: 95%  Weight: 231 lb (104.8 kg)  Height: 5\' 4"  (1.626 m)    Assessment & Plan:  Flu shot given at visit

## 2018-04-02 NOTE — Assessment & Plan Note (Signed)
Taking arimidex and doing well overall.

## 2018-04-02 NOTE — Patient Instructions (Signed)
We will check the blood work today for the cholesterol.   Health Maintenance, Female Adopting a healthy lifestyle and getting preventive care can go a long way to promote health and wellness. Talk with your health care provider about what schedule of regular examinations is right for you. This is a good chance for you to check in with your provider about disease prevention and staying healthy. In between checkups, there are plenty of things you can do on your own. Experts have done a lot of research about which lifestyle changes and preventive measures are most likely to keep you healthy. Ask your health care provider for more information. Weight and diet Eat a healthy diet  Be sure to include plenty of vegetables, fruits, low-fat dairy products, and lean protein.  Do not eat a lot of foods high in solid fats, added sugars, or salt.  Get regular exercise. This is one of the most important things you can do for your health. ? Most adults should exercise for at least 150 minutes each week. The exercise should increase your heart rate and make you sweat (moderate-intensity exercise). ? Most adults should also do strengthening exercises at least twice a week. This is in addition to the moderate-intensity exercise. Maintain a healthy weight  Body mass index (BMI) is a measurement that can be used to identify possible weight problems. It estimates body fat based on height and weight. Your health care provider can help determine your BMI and help you achieve or maintain a healthy weight.  For females 92 years of age and older: ? A BMI below 18.5 is considered underweight. ? A BMI of 18.5 to 24.9 is normal. ? A BMI of 25 to 29.9 is considered overweight. ? A BMI of 30 and above is considered obese. Watch levels of cholesterol and blood lipids  You should start having your blood tested for lipids and cholesterol at 62 years of age, then have this test every 5 years.  You may need to have your  cholesterol levels checked more often if: ? Your lipid or cholesterol levels are high. ? You are older than 62 years of age. ? You are at high risk for heart disease. Cancer screening Lung Cancer  Lung cancer screening is recommended for adults 81-9 years old who are at high risk for lung cancer because of a history of smoking.  A yearly low-dose CT scan of the lungs is recommended for people who: ? Currently smoke. ? Have quit within the past 15 years. ? Have at least a 30-pack-year history of smoking. A pack year is smoking an average of one pack of cigarettes a day for 1 year.  Yearly screening should continue until it has been 15 years since you quit.  Yearly screening should stop if you develop a health problem that would prevent you from having lung cancer treatment. Breast Cancer  Practice breast self-awareness. This means understanding how your breasts normally appear and feel.  It also means doing regular breast self-exams. Let your health care provider know about any changes, no matter how small.  If you are in your 20s or 30s, you should have a clinical breast exam (CBE) by a health care provider every 1-3 years as part of a regular health exam.  If you are 22 or older, have a CBE every year. Also consider having a breast X-ray (mammogram) every year.  If you have a family history of breast cancer, talk to your health care provider about genetic  screening.  If you are at high risk for breast cancer, talk to your health care provider about having an MRI and a mammogram every year.  Breast cancer gene (BRCA) assessment is recommended for women who have family members with BRCA-related cancers. BRCA-related cancers include: ? Breast. ? Ovarian. ? Tubal. ? Peritoneal cancers.  Results of the assessment will determine the need for genetic counseling and BRCA1 and BRCA2 testing. Cervical Cancer Your health care provider may recommend that you be screened regularly for  cancer of the pelvic organs (ovaries, uterus, and vagina). This screening involves a pelvic examination, including checking for microscopic changes to the surface of your cervix (Pap test). You may be encouraged to have this screening done every 3 years, beginning at age 64.  For women ages 23-65, health care providers may recommend pelvic exams and Pap testing every 3 years, or they may recommend the Pap and pelvic exam, combined with testing for human papilloma virus (HPV), every 5 years. Some types of HPV increase your risk of cervical cancer. Testing for HPV may also be done on women of any age with unclear Pap test results.  Other health care providers may not recommend any screening for nonpregnant women who are considered low risk for pelvic cancer and who do not have symptoms. Ask your health care provider if a screening pelvic exam is right for you.  If you have had past treatment for cervical cancer or a condition that could lead to cancer, you need Pap tests and screening for cancer for at least 20 years after your treatment. If Pap tests have been discontinued, your risk factors (such as having a new sexual partner) need to be reassessed to determine if screening should resume. Some women have medical problems that increase the chance of getting cervical cancer. In these cases, your health care provider may recommend more frequent screening and Pap tests. Colorectal Cancer  This type of cancer can be detected and often prevented.  Routine colorectal cancer screening usually begins at 62 years of age and continues through 62 years of age.  Your health care provider may recommend screening at an earlier age if you have risk factors for colon cancer.  Your health care provider may also recommend using home test kits to check for hidden blood in the stool.  A small camera at the end of a tube can be used to examine your colon directly (sigmoidoscopy or colonoscopy). This is done to check for  the earliest forms of colorectal cancer.  Routine screening usually begins at age 63.  Direct examination of the colon should be repeated every 5-10 years through 62 years of age. However, you may need to be screened more often if early forms of precancerous polyps or small growths are found. Skin Cancer  Check your skin from head to toe regularly.  Tell your health care provider about any new moles or changes in moles, especially if there is a change in a mole's shape or color.  Also tell your health care provider if you have a mole that is larger than the size of a pencil eraser.  Always use sunscreen. Apply sunscreen liberally and repeatedly throughout the day.  Protect yourself by wearing long sleeves, pants, a wide-brimmed hat, and sunglasses whenever you are outside. Heart disease, diabetes, and high blood pressure  High blood pressure causes heart disease and increases the risk of stroke. High blood pressure is more likely to develop in: ? People who have blood pressure in  the high end of the normal range (130-139/85-89 mm Hg). ? People who are overweight or obese. ? People who are African American.  If you are 33-49 years of age, have your blood pressure checked every 3-5 years. If you are 67 years of age or older, have your blood pressure checked every year. You should have your blood pressure measured twice-once when you are at a hospital or clinic, and once when you are not at a hospital or clinic. Record the average of the two measurements. To check your blood pressure when you are not at a hospital or clinic, you can use: ? An automated blood pressure machine at a pharmacy. ? A home blood pressure monitor.  If you are between 65 years and 48 years old, ask your health care provider if you should take aspirin to prevent strokes.  Have regular diabetes screenings. This involves taking a blood sample to check your fasting blood sugar level. ? If you are at a normal weight and  have a low risk for diabetes, have this test once every three years after 62 years of age. ? If you are overweight and have a high risk for diabetes, consider being tested at a younger age or more often. Preventing infection Hepatitis B  If you have a higher risk for hepatitis B, you should be screened for this virus. You are considered at high risk for hepatitis B if: ? You were born in a country where hepatitis B is common. Ask your health care provider which countries are considered high risk. ? Your parents were born in a high-risk country, and you have not been immunized against hepatitis B (hepatitis B vaccine). ? You have HIV or AIDS. ? You use needles to inject street drugs. ? You live with someone who has hepatitis B. ? You have had sex with someone who has hepatitis B. ? You get hemodialysis treatment. ? You take certain medicines for conditions, including cancer, organ transplantation, and autoimmune conditions. Hepatitis C  Blood testing is recommended for: ? Everyone born from [birth year] through 1964. ? Anyone with known risk factors for hepatitis C. Sexually transmitted infections (STIs)  You should be screened for sexually transmitted infections (STIs) including gonorrhea and chlamydia if: ? You are sexually active and are younger than 62 years of age. ? You are older than 62 years of age and your health care provider tells you that you are at risk for this type of infection. ? Your sexual activity has changed since you were last screened and you are at an increased risk for chlamydia or gonorrhea. Ask your health care provider if you are at risk.  If you do not have HIV, but are at risk, it may be recommended that you take a prescription medicine daily to prevent HIV infection. This is called pre-exposure prophylaxis (PrEP). You are considered at risk if: ? You are sexually active and do not regularly use condoms or know the HIV status of your partner(s). ? You take drugs by  injection. ? You are sexually active with a partner who has HIV. Talk with your health care provider about whether you are at high risk of being infected with HIV. If you choose to begin PrEP, you should first be tested for HIV. You should then be tested every 3 months for as long as you are taking PrEP. Pregnancy  If you are premenopausal and you may become pregnant, ask your health care provider about preconception counseling.  If you may  become pregnant, take 400 to 800 micrograms (mcg) of folic acid every day.  If you want to prevent pregnancy, talk to your health care provider about birth control (contraception). Osteoporosis and menopause  Osteoporosis is a disease in which the bones lose minerals and strength with aging. This can result in serious bone fractures. Your risk for osteoporosis can be identified using a bone density scan.  If you are 50 years of age or older, or if you are at risk for osteoporosis and fractures, ask your health care provider if you should be screened.  Ask your health care provider whether you should take a calcium or vitamin D supplement to lower your risk for osteoporosis.  Menopause may have certain physical symptoms and risks.  Hormone replacement therapy may reduce some of these symptoms and risks. Talk to your health care provider about whether hormone replacement therapy is right for you. Follow these instructions at home:  Schedule regular health, dental, and eye exams.  Stay current with your immunizations.  Do not use any tobacco products including cigarettes, chewing tobacco, or electronic cigarettes.  If you are pregnant, do not drink alcohol.  If you are breastfeeding, limit how much and how often you drink alcohol.  Limit alcohol intake to no more than 1 drink per day for nonpregnant women. One drink equals 12 ounces of beer, 5 ounces of wine, or 1 ounces of hard liquor.  Do not use street drugs.  Do not share needles.  Ask  your health care provider for help if you need support or information about quitting drugs.  Tell your health care provider if you often feel depressed.  Tell your health care provider if you have ever been abused or do not feel safe at home. This information is not intended to replace advice given to you by your health care provider. Make sure you discuss any questions you have with your health care provider. Document Released: 08/09/2010 Document Revised: 07/02/2015 Document Reviewed: 10/28/2014 Elsevier Interactive Patient Education  2019 [employer].

## 2018-04-02 NOTE — Assessment & Plan Note (Signed)
Taking zoloft 100 mg daily and still controlled.

## 2018-04-18 ENCOUNTER — Telehealth: Payer: Self-pay

## 2018-04-18 NOTE — Telephone Encounter (Signed)
Spoke with patient reminding of SCP visit with NP on 04/26/18 at 2 pm.  Patient said she will come to appt.

## 2018-04-23 ENCOUNTER — Other Ambulatory Visit: Payer: Self-pay | Admitting: Internal Medicine

## 2018-04-23 DIAGNOSIS — I10 Essential (primary) hypertension: Secondary | ICD-10-CM

## 2018-04-26 ENCOUNTER — Inpatient Hospital Stay: Payer: BC Managed Care – PPO | Admitting: Adult Health

## 2018-07-20 IMAGING — CR DG CHEST 1V PORT
1 series · 1 of 1 positions shown · non-contrast
Comparison: None.

CLINICAL DATA: Port-A-Cath placement

EXAM:
PORTABLE CHEST 1 VIEW

[AP]
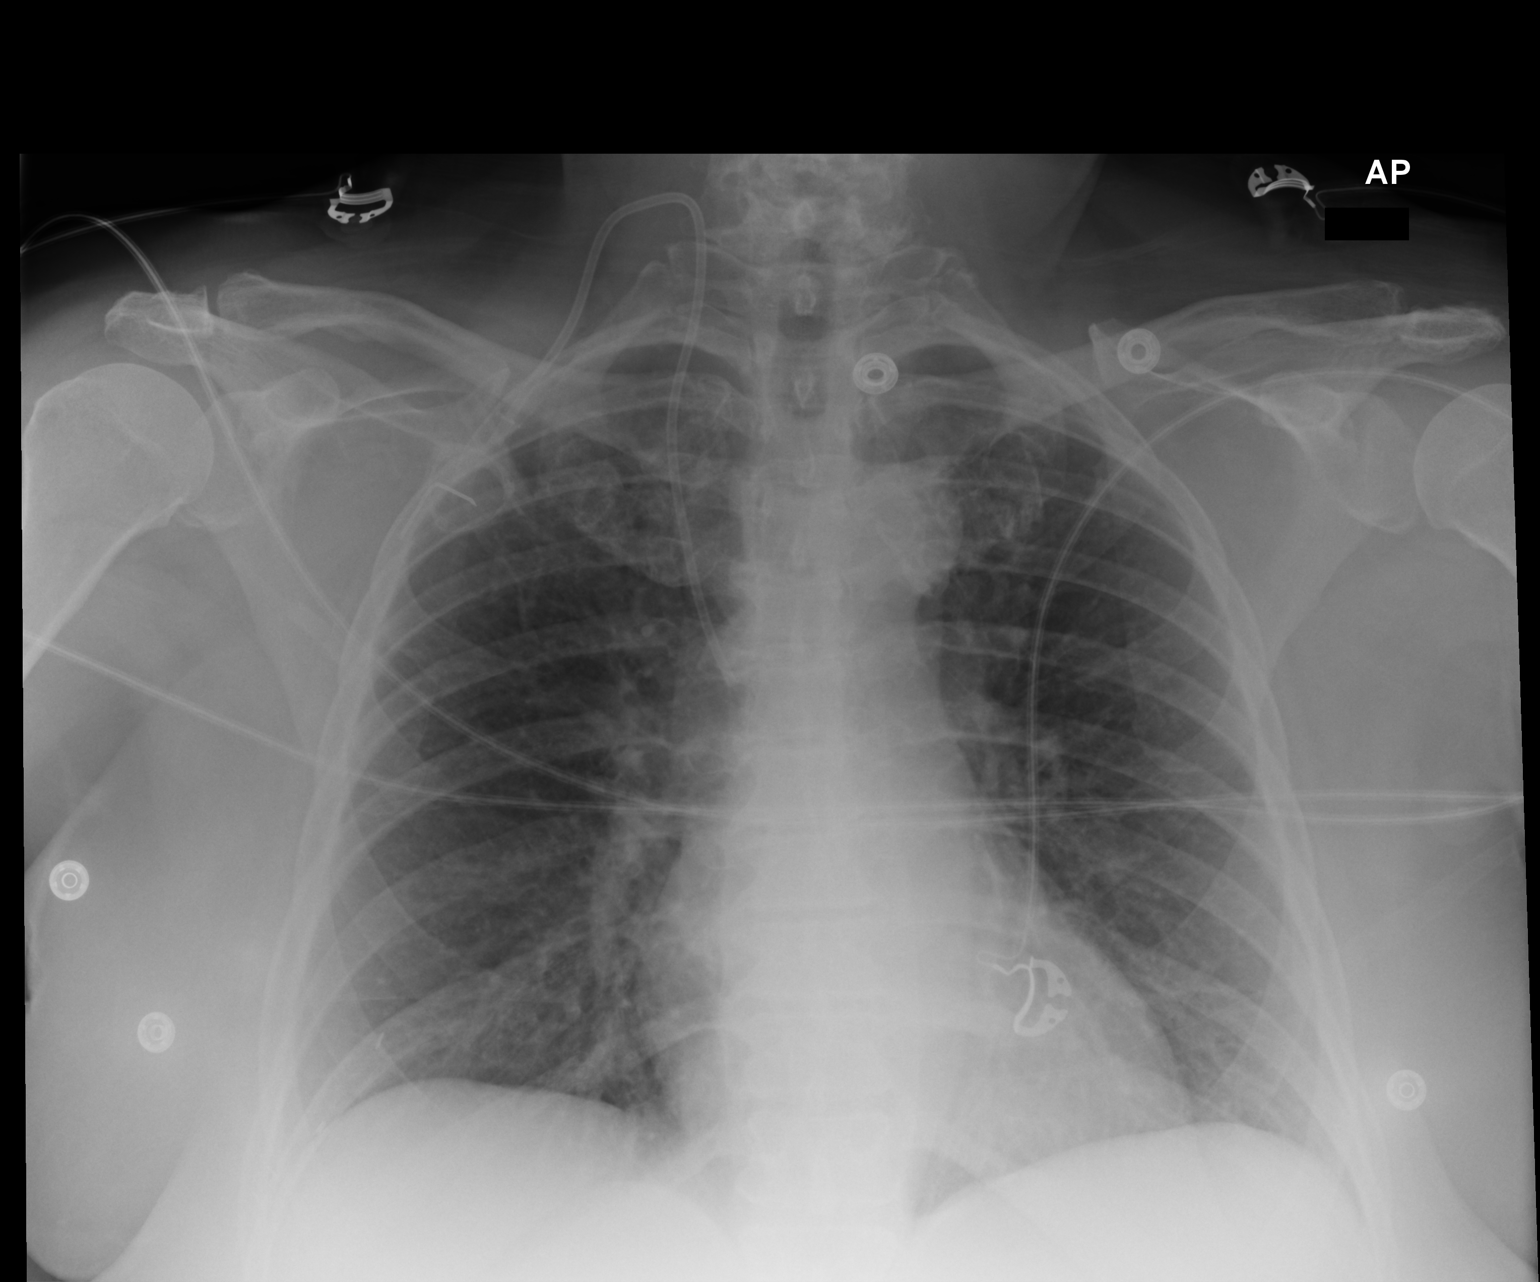

[1 of 1 positions shown; findings below may reference images not displayed]

FINDINGS: Right-sided Port-A-Cath from an internal jugular approach. This
terminates at the mid SVC.

Midline trachea. Normal heart size. No pleural effusion or
pneumothorax. Clear lungs.
IMPRESSION: Right-sided Port-A-Cath, as detailed above. This terminates at the
mid SVC.

No pneumothorax or other acute complication.

## 2018-08-08 ENCOUNTER — Telehealth: Payer: Self-pay | Admitting: Adult Health

## 2018-08-08 NOTE — Telephone Encounter (Signed)
I left a message regarding visit °

## 2018-08-27 ENCOUNTER — Telehealth: Payer: Self-pay | Admitting: Adult Health

## 2018-08-27 NOTE — Telephone Encounter (Signed)
Called patient to confirm appt and verify info but unable to leave voicemail. Voicemail is full.

## 2018-08-28 ENCOUNTER — Inpatient Hospital Stay: Payer: BC Managed Care – PPO | Attending: Adult Health | Admitting: Adult Health

## 2018-08-28 ENCOUNTER — Encounter: Payer: Self-pay | Admitting: Adult Health

## 2018-08-28 DIAGNOSIS — C50311 Malignant neoplasm of lower-inner quadrant of right female breast: Secondary | ICD-10-CM | POA: Diagnosis not present

## 2018-08-28 DIAGNOSIS — Z79811 Long term (current) use of aromatase inhibitors: Secondary | ICD-10-CM

## 2018-08-28 DIAGNOSIS — Z170 Estrogen receptor positive status [ER+]: Secondary | ICD-10-CM | POA: Diagnosis not present

## 2018-08-28 DIAGNOSIS — Z9221 Personal history of antineoplastic chemotherapy: Secondary | ICD-10-CM

## 2018-08-28 DIAGNOSIS — Z923 Personal history of irradiation: Secondary | ICD-10-CM

## 2018-08-28 NOTE — Progress Notes (Signed)
SURVIVORSHIP VIRTUAL VISIT:  I connected with Shelby Payne on 08/28/18 at  3:00 PM EDT by video and verified that I am speaking with the correct person using two identifiers.   I discussed the limitations, risks, security and privacy concerns of performing an evaluation and management service virtually and the availability of in person appointments. I also discussed with the patient that there may be a patient responsible charge related to this service. The patient expressed understanding and agreed to proceed.   BRIEF ONCOLOGIC HISTORY:  Oncology History  Malignant neoplasm of lower-inner quadrant of right breast of female, estrogen receptor positive ([place])  01/11/2017 Initial Diagnosis   [place] woman s/p right breast lower inner quadrant biopsy 01/11/2017 for a cT1c cN0, stage IA invasive ductal breast cancer, estrogen receptor positive, progesterone receptor negativem HER-2 not amplified, with an Mib-1 of 20%   01/25/2017 Surgery   status post right lumpectomy and sentinel lymph node sampling for a pT1c pN0, stage Ia invasive ductal carcinoma, grade 2, with negative margins.  7 SLN neg   01/25/2017 Oncotype testing   Oncotype DX score of 42 predicts a 9-year risk of recurrence outside the breast of 30% if the patient's only systemic therapy is tamoxifen for 5 years.  It also predicts a significant chemotherapy benefit.   03/14/2017 - 08/08/2017 Adjuvant Chemotherapy   adjuvant chemotherapy consisting of cyclophosphamide and doxorubicin in dose dense fashion x4 started 03/14/2017, completed 05/09/2017, followed by weekly paclitaxel x12 started 05/23/2017, completed 08/08/2017             (a) cycle 3 doxorubicin /cyclophosphamide delayed 1 week because of poor tolerance   03/22/2017 Genetic Testing   Negative genetic testing on the common hereditary cancer panel.  The Hereditary Gene Panel offered by [facility] includes sequencing and/or deletion duplication testing of the following 47 genes:  APC, ATM, AXIN2, BARD1, BMPR1A, BRCA1, BRCA2, BRIP1, CDH1, CDK4, CDKN2A (p14ARF), CDKN2A (p16INK4a), CHEK2, CTNNA1, DICER1, EPCAM (Deletion/duplication testing only), GREM1 (promoter region deletion/duplication testing only), KIT, MEN1, MLH1, MSH2, MSH3, MSH6, MUTYH, NBN, NF1, NHTL1, PALB2, PDGFRA, PMS2, POLD1, POLE, PTEN, RAD50, RAD51C, RAD51D, SDHB, SDHC, SDHD, SMAD4, SMARCA4. STK11, TP53, TSC1, TSC2, and VHL.  The following genes were evaluated for sequence changes only: SDHA and HOXB13 c.251G>A variant only.  The report date is March 22, 2017.    08/31/2017 - 09/27/2017 Radiation Therapy   adjuvant radiation  08/31/2017 - 09/27/2017 Site/dose:    1. Right Breast / 40.05 Gy in 15 fractions 2. Right Breast Boost / 10 Gy in 5 fractions   10/2017 -  Anti-estrogen oral therapy   anastrozole started 10/12/2017             (a) [facility] at [facility] 01/10/2018 was normal     INTERVAL HISTORY:  Shelby Payne to review her survivorship care plan detailing her treatment course for breast cancer, as well as monitoring long-term side effects of that treatment, education regarding health maintenance, screening, and overall wellness and health promotion.     Overall, Shelby Payne reports feeling quite well.  She is taking Anastrozole daily.  She is tolerating this well.  She notes some challenges with her right third digit.  She notes some intermittent stiffness, particularly in the morning.  She is having occasional mild hot flashes that are tolerable.  She denies any vaginal dryness.    REVIEW OF SYSTEMS:  Review of Systems  Constitutional: Negative for appetite change, chills, fatigue and unexpected weight change.  HENT:   Negative for hearing  loss, lump/mass, sore throat and trouble swallowing.   Eyes: Negative for eye problems and icterus.  Respiratory: Negative for chest tightness, cough and shortness of breath.   Cardiovascular: Negative for chest pain, leg swelling and palpitations.    Gastrointestinal: Negative for abdominal distention, abdominal pain, constipation, diarrhea, nausea and vomiting.  Endocrine: Negative for hot flashes.  Genitourinary: Negative for difficulty urinating.   Musculoskeletal: Negative for arthralgias.  Skin: Negative for itching and rash.  Neurological: Negative for dizziness, extremity weakness and numbness.  Hematological: Negative for adenopathy. Does not bruise/bleed easily.  Psychiatric/Behavioral: Negative for depression. The patient is not nervous/anxious.   Breast: Denies any new nodularity, masses, tenderness, nipple changes, or nipple discharge.      ONCOLOGY TREATMENT TEAM:  1. Surgeon:  Dr. Brantley Stage at [facility] 2. Medical Oncologist: Dr. Jana Hakim  3. Radiation Oncologist: Dr. Isidore Moos    PAST MEDICAL/SURGICAL HISTORY:  Past Medical History:  Diagnosis Date   Acute sinusitis, unspecified    Anxiety    Cancer ([place]) 12/2016   right breast cancer   Depression    Family history of prostate cancer    History of radiation therapy 08/31/17- 09/27/17   Right Breast/ 40.05 Gy in 15 fractions, Right Breast Boost/ 10 Gy in 5 fractions.    Hypertension    Obesity, unspecified    Pneumonia, organism unspecified(486)    Past Surgical History:  Procedure Laterality Date   BREAST LUMPECTOMY WITH RADIOACTIVE SEED AND SENTINEL LYMPH NODE BIOPSY Right 01/25/2017   Procedure: RIGHT BREAST LUMPECTOMY  WITH 2 RADIOACTIVE SEEDS AND SENTINEL LYMPH NODE BIOPSY;  Surgeon: Erroll Luna, MD;  Location: [facility];  Service: General;  Laterality: Right;   PORTACATH PLACEMENT Right 03/13/2017   Procedure: ULTRASOUND GUIDED INSERTION PORT-A-CATH RIGHT INTERNAL JUGULAR;  Surgeon: Erroll Luna, MD;  Location: [facility];  Service: General;  Laterality: Right;   WISDOM TOOTH EXTRACTION       ALLERGIES:  Allergies  Allergen Reactions   Hctz [Hydrochlorothiazide] Other (See Comments)    Hyponatremia    Bactrim [Sulfamethoxazole-Trimethoprim] Rash     CURRENT MEDICATIONS:  Outpatient Encounter Medications as of 08/28/2018  Medication Sig   anastrozole (ARIMIDEX) 1 MG tablet Take 1 tablet (1 mg total) by mouth daily.   b complex vitamins tablet Take 1 tablet by mouth daily.   Coenzyme Q10 (CO Q-10 PO) Take 1 capsule by mouth daily.    metoprolol succinate (TOPROL-XL) 100 MG 24 hr tablet TAKE 1 TABLET(100 MG) BY MOUTH DAILY   Omega-3 Fatty Acids (FISH OIL PO) Take 1 capsule by mouth daily.    sertraline (ZOLOFT) 100 MG tablet TAKE 1 TABLET(100 MG) BY MOUTH DAILY   Telmisartan-amLODIPine 40-5 MG TABS TAKE 1 TABLET BY MOUTH DAILY   triamcinolone cream (KENALOG) 0.1 % Apply 1 application 2 (two) times daily topically. (Patient taking differently: Apply 1 application topically 2 (two) times daily as needed (for rash). )   No facility-administered encounter medications on file as of 08/28/2018.      ONCOLOGIC FAMILY HISTORY:  Family History  Problem Relation Age of Onset   Hypertension Mother    Hypothyroidism Mother    Hyperlipidemia Mother    Gout Mother    Breast cancer Mother 14   Heart disease Father    Hypertension Father    Diabetes Father    Hypothyroidism Father    Prostate cancer Father        dx late 15s   Hypothyroidism Sister  Cancer Sister        angiosarcoma - mets   Esophageal cancer Maternal Uncle    Stroke Paternal Aunt    Stroke Paternal Uncle    Heart attack Maternal Grandfather    ALS Paternal Grandfather    COPD Neg Hx    Colon cancer Neg Hx    Rectal cancer Neg Hx    Stomach cancer Neg Hx      GENETIC COUNSELING/TESTING: See above  SOCIAL HISTORY:  Social History   Socioeconomic History   Marital status: Married    Spouse name: Not on file   Number of children: Not on file   Years of education: Not on file   Highest education level: Not on file  Occupational History   Not on file  Social Needs    Financial resource strain: Not on file   Food insecurity    Worry: Not on file    Inability: Not on file   Transportation needs    Medical: No    Non-medical: No  Tobacco Use   Smoking status: Never Smoker   Smokeless tobacco: Never Used  Substance and Sexual Activity   Alcohol use: Yes    Comment: rare glass of wine   Drug use: No   Sexual activity: Yes    Partners: Male  Lifestyle   Physical activity    Days per week: Not on file    Minutes per session: Not on file   Stress: Not on file  Relationships   Social connections    Talks on phone: Not on file    Gets together: Not on file    Attends religious service: Not on file    Active member of club or organization: Not on file    Attends meetings of clubs or organizations: Not on file    Relationship status: Not on file   Intimate partner violence    Fear of current or ex partner: No    Emotionally abused: No    Physically abused: No    Forced sexual activity: No  Other Topics Concern   Not on file  Social History Narrative   HS[employer], [employer], Master's degree in speech pathology, Master physiology. Married [phone] son 19; 1 daughter 63 - at [facility] (12/12). 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 Marriage is in good  Health   Resigned from her job March 2014     OBSERVATIONS/OBJECTIVE:   LABORATORY DATA:  None for this visit.  DIAGNOSTIC IMAGING:      ASSESSMENT AND PLAN:  Ms.. Bonebrake is a pleasant 63 y.o. female with Stage IA right breast invasive ductal carcinoma, ER+/PR-/HER2-, diagnosed in 01/2017, treated with lumpectomy, adjuvant chemotherapy, adjuvant radiation therapy, and anti-estrogen therapy with Anastrozole beginning in 10/2017.  She presents to the Survivorship Clinic for our initial meeting and routine follow-up post-completion of treatment for breast cancer.    1. Stage IA right breast cancer:  Ms. Mischke is continuing to recover from definitive treatment for breast  cancer. She will follow-up with her medical oncologist, Dr. Jana Hakim in 10/2018 with history and physical exam per surveillance protocol.  She will continue her anti-estrogen therapy with Anastrozole. Thus far, she is tolerating the Anastrozole well, with minimal side effects.  Her mammogram is due 01/2019; orders placed today.  Her breast density is category A. Today, a comprehensive survivorship care plan and treatment summary was reviewed with the patient today detailing her breast cancer diagnosis, treatment course, potential late/long-term effects of treatment,  appropriate follow-up care with recommendations for the future, and patient education resources.  A copy of this summary, along with a letter will be sent to the patients primary care provider via mail/fax/In Basket message after todays visit.    2. Finger pain: Recommended she take an anti inflammatory and consider f/u with PCP or ortho for potential injection.    3. Bone health:  Given Ms. Reader age/history of breast cancer and her current treatment regimen including anti-estrogen therapy with Anastrozole, she is at risk for bone demineralization.  Her last DEXA scan was 01/2018, and was normal.  She will be due again in 01/2020.  In the meantime, she was encouraged to increase her consumption of foods rich in calcium, as well as increase her weight-bearing activities.  She was given education on specific activities to promote bone health.  4. Cancer screening:  Due to Ms. Trussell history and her age, she should receive screening for skin cancers, colon cancer, and gynecologic cancers.  The information and recommendations are listed on the patient's comprehensive care plan/treatment summary and were reviewed in detail with the patient.    5. Health maintenance and wellness promotion: Ms. Klare was encouraged to consume 5-7 servings of fruits and vegetables per day. We reviewed the "Nutrition Rainbow" handout, as well as the  handout "Take Control of Your Health and Reduce Your Cancer Risk" from the [employer].  She was also encouraged to engage in moderate to vigorous exercise for 30 minutes per day most days of the week. We discussed the [employer] fitness program, which is designed for cancer survivors to help them become more physically fit after cancer treatments.  She was instructed to limit her alcohol consumption and continue to abstain from tobacco use.     6. Support services/counseling: It is not uncommon for this period of the patient's cancer care trajectory to be one of many emotions and stressors.  We discussed how this can be increasingly difficult during the times of quarantine and social distancing due to the COVID-19 pandemic.   She was given information regarding our available services and encouraged to contact me with any questions or for help enrolling in any of our support group/programs.    Follow up instructions:    -Return to cancer center 10/2018 for f/u with Dr. Jana Hakim  -Mammogram due in 01/2019 -She was recommended to continue with the appropriate pandemic precautions.  -She is welcome to return back to the Survivorship Clinic at any time; no additional follow-up needed at this time.  -Consider referral back to survivorship as a long-term survivor for continued surveillance  The patient was provided an opportunity to ask questions and all were answered. The patient agreed with the plan and demonstrated an understanding of the instructions.   The patient was advised to call back or seek an in-person evaluation if the symptoms worsen or if the condition fails to improve as anticipated.   I provided 25 minutes of face-to-face video visit time during this encounter, and > 50% was spent counseling as documented under my assessment & plan.  Scot Dock, NP

## 2018-10-11 ENCOUNTER — Other Ambulatory Visit: Payer: Self-pay | Admitting: Internal Medicine

## 2018-10-11 DIAGNOSIS — I10 Essential (primary) hypertension: Secondary | ICD-10-CM

## 2018-10-22 ENCOUNTER — Other Ambulatory Visit: Payer: Self-pay | Admitting: *Deleted

## 2018-10-22 DIAGNOSIS — C50311 Malignant neoplasm of lower-inner quadrant of right female breast: Secondary | ICD-10-CM

## 2018-10-22 NOTE — Progress Notes (Signed)
[place]  [phone] [phone]     ID: RITAJ DULLEA DOB: [birth year]  MR#: [id number]  PXT#:[id number]  Patient Care Team: Hoyt Koch, MD as PCP - General (Internal Medicine) , Virgie Dad, MD as Consulting Physician (Oncology) Eppie Gibson, MD as Attending Physician (Radiation Oncology) Erroll Luna, MD as Consulting Physician (General Surgery) Christene Slates, MD as Physician Assistant (Radiology) OTHER MD:   CHIEF COMPLAINT: Estrogen receptor positive breast cancer  CURRENT TREATMENT: Anastrozole   HISTORY OF CURRENT ILLNESS: From the original intake note:  The patient had bilateral screening mammography at [facility] 12/28/2016.  There was a new mass in the right breast lower inner quadrant and some new grouped calcifications in the right breast same quadrant.  Accordingly the patient was called back for right diagnostic mammography and ultrasonography 01/09/2017.  The breast density was category A.  In the right breast lower inner quadrant there was a new irregular mass.  Again noted was a new group of heterogeneous calcifications in the same quadrant.  Ultrasound confirmed a 1.7 cm irregular mass in the right breast lower inner quadrant.  There were no abnormalities sonographically in the right axilla.  Taken together,  the mass plus calcifications measured up to 4 cm.  Accordingly on 01/11/2017 she underwent biopsy of the right breast mass in question, showing [phone]) invasive ductal carcinoma, grade 2, estrogen receptor 90% positive with strong staining intensity, progesterone receptor negative, with an Mib-1 of 20% and no HER-2 amplification, the signals ratio being 1.26 and the number per cell 2.20  The patient's subsequent history is as detailed below.   INTERVAL HISTORY: Hayla returns today for follow-up and treatment of her estrogen receptor positive breast cancer. She was last seen here on 01/16/2018.   She  continues on anastrozole.  She tolerates this generally well without significant hot flashes, vaginal dryness, or arthralgias/myalgias.  [facility]'s last bone density screening on 01/10/2018, showed a T-score of -.0.9, which is considered normal.    Since her last visit here, she has not undergone any additional studies. She is up to date with her mammography, which was last completed on 01/10/2018.  Breast density was category A.   REVIEW OF SYSTEMS: Yaminah sometimes wakes up in the morning with her middle digit of the right hand stiff.  This improves during the day.  She has pain in the left shoulder which can go down halfway on the outside of her left arm.  This is again very variable and worse in the morning especially if she sleeps on her left side.  Her hair has come back for easier than she likes.  Aside from this a detailed review of systems today was noncontributory   PAST MEDICAL HISTORY: Past Medical History:  Diagnosis Date  . Acute sinusitis, unspecified   . Anxiety   . Cancer ([place]) 12/2016   right breast cancer  . Depression   . Family history of prostate cancer   . History of radiation therapy 08/31/17- 09/27/17   Right Breast/ 40.05 Gy in 15 fractions, Right Breast Boost/ 10 Gy in 5 fractions.   . Hypertension   . Obesity, unspecified   . Pneumonia, organism unspecified(486)     PAST SURGICAL HISTORY: Past Surgical History:  Procedure Laterality Date  . BREAST LUMPECTOMY WITH RADIOACTIVE SEED AND SENTINEL LYMPH NODE BIOPSY Right 01/25/2017   Procedure: RIGHT BREAST LUMPECTOMY  WITH 2 RADIOACTIVE SEEDS AND SENTINEL LYMPH NODE BIOPSY;  Surgeon: Erroll Luna, MD;  Location: [facility];  Service: General;  Laterality: Right;  . PORTACATH PLACEMENT Right 03/13/2017   Procedure: ULTRASOUND GUIDED INSERTION PORT-A-CATH RIGHT INTERNAL JUGULAR;  Surgeon: Erroll Luna, MD;  Location: [facility];  Service: General;  Laterality: Right;  . WISDOM TOOTH EXTRACTION       FAMILY HISTORY Family History  Problem Relation Age of Onset  . Hypertension Mother   . Hypothyroidism Mother   . Hyperlipidemia Mother   . Gout Mother   . Breast cancer Mother 29  . Heart disease Father   . Hypertension Father   . Diabetes Father   . Hypothyroidism Father   . Prostate cancer Father        dx late 73s  . Hypothyroidism Sister   . Cancer Sister        angiosarcoma - mets  . Esophageal cancer Maternal Uncle   . Stroke Paternal Aunt   . Stroke Paternal Uncle   . Heart attack Maternal Grandfather   . ALS Paternal Grandfather   . COPD Neg Hx   . Colon cancer Neg Hx   . Rectal cancer Neg Hx   . Stomach cancer Neg Hx   Her father passed away from a massive stroke at 62. Her mother passed away from complications of breast cancer at 62 She had been diagnosed age 62. The patient's sister had angiosarcoma at 62. There is no otherhistory of breast or ovarian cancer in this famiy. The patient is of Ashkenazi extraction  GYNECOLOGIC HISTORY:  Patient's last menstrual period was 07/12/2011.  Menarche age 51, first live birth age 70, she is GXP2. Menopause 2015, never used OCPs or HR   SOCIAL HISTORY: (updated 01/16/2018) She is a retired Electrical engineer at [employer]. Her husband, [name] was a Curator at [employer] in [place], but is retired now. Her daughter [name] teaches nursing at [employer]. [name] is concerned that on her father's side a cousin has tested positive for a deleterious gene; she will try to bring [name] that information]. Son, [name] passed away. The patient does not have any grandchildren. She attends [employer].     ADVANCED DIRECTIVES:    HEALTH MAINTENANCE: Social History   Tobacco Use  . Smoking status: Never Smoker  . Smokeless tobacco: Never Used  Substance Use Topics  . Alcohol use: Yes    Comment: rare glass of wine  . Drug use: No     Colonoscopy: 2013  PAP:  Bone density: Due December 2019 at  [facility] Reactions  . Hctz [Hydrochlorothiazide] Other (See Comments)    Hyponatremia  . Bactrim [Sulfamethoxazole-Trimethoprim] Rash    Current Outpatient Medications  Medication Sig Dispense Refill  . anastrozole (ARIMIDEX) 1 MG tablet Take 1 tablet (1 mg total) by mouth daily. 90 tablet 4  . b complex vitamins tablet Take 1 tablet by mouth daily.    . Coenzyme Q10 (CO Q-10 PO) Take 1 capsule by mouth daily.     . metoprolol succinate (TOPROL-XL) 100 MG 24 hr tablet TAKE 1 TABLET(100 MG) BY MOUTH DAILY 90 tablet 1  . Omega-3 Fatty Acids (FISH OIL PO) Take 1 capsule by mouth daily.     . sertraline (ZOLOFT) 100 MG tablet TAKE 1 TABLET(100 MG) BY MOUTH DAILY 90 tablet 1  . Telmisartan-amLODIPine 40-5 MG TABS TAKE 1 TABLET BY MOUTH DAILY 90 tablet 1  . triamcinolone cream (KENALOG) 0.1 % Apply 1 application 2 (two) times daily topically. (Patient taking differently:  Apply 1 application topically 2 (two) times daily as needed (for rash). ) 30 g 6   No current facility-administered medications for this visit.     OBJECTIVE: Middle-aged white woman in no acute distress  Vitals:   10/23/18 1418  BP: 132/85  Pulse: 81  Resp: 18  Temp: 98.5 F (36.9 C)  SpO2: 98%   Wt Readings from Last 3 Encounters:  10/23/18 231 lb 8 oz (105 kg)  04/02/18 231 lb (104.8 kg)  01/16/18 233 lb 1.6 oz (105.7 kg)   Body mass index is 39.74 kg/m.    ECOG FS:1 - Symptomatic but completely ambulatory  Ocular: Sclerae unicteric, pupils round and equal Ear-nose-throat: Wearing a mask Lymphatic: No cervical or supraclavicular adenopathy Lungs no rales or rhonchi Heart regular rate and rhythm Abd soft, nontender, positive bowel sounds MSK no focal spinal tenderness, no joint edema Neuro: non-focal, well-oriented, appropriate affect Breasts: The right breast is status post lumpectomy followed by radiation.  The cosmetic result is very good.  There is no evidence of disease  recurrence.  Left breast is benign.  Both axillae are benign.   LAB RESULTS:  CMP     Component Value Date/Time   NA 137 10/23/2018 1402   NA 137 01/18/2017 1238   K 4.4 10/23/2018 1402   K 4.1 01/18/2017 1238   CL 101 10/23/2018 1402   CO2 25 10/23/2018 1402   CO2 22 01/18/2017 1238   GLUCOSE 109 (H) 10/23/2018 1402   GLUCOSE 91 01/18/2017 1238   BUN 14 10/23/2018 1402   BUN 19.5 01/18/2017 1238   CREATININE 0.88 10/23/2018 1402   CREATININE 0.7 01/18/2017 1238   CALCIUM 9.9 10/23/2018 1402   CALCIUM 9.8 01/18/2017 1238   PROT 7.6 10/23/2018 1402   PROT 7.9 01/18/2017 1238   ALBUMIN 4.3 10/23/2018 1402   ALBUMIN 4.3 01/18/2017 1238   AST 25 10/23/2018 1402   AST 19 01/18/2017 1238   ALT 36 10/23/2018 1402   ALT 24 01/18/2017 1238   ALKPHOS 65 10/23/2018 1402   ALKPHOS 53 01/18/2017 1238   BILITOT 0.4 10/23/2018 1402   BILITOT 0.39 01/18/2017 1238   GFRNONAA >60 10/23/2018 1402   GFRAA >60 10/23/2018 1402    No results found for: TOTALPROTELP, ALBUMINELP, A1GS, A2GS, BETS, BETA2SER, GAMS, MSPIKE, SPEI  No results found for: KPAFRELGTCHN, LAMBDASER, KAPLAMBRATIO  Lab Results  Component Value Date   WBC 7.7 10/23/2018   NEUTROABS 5.6 10/23/2018   HGB 13.8 10/23/2018   HCT 41.1 10/23/2018   MCV 89.0 10/23/2018   PLT 203 10/23/2018    _0 @  No results found for: LABCA2  No components found for: [id number]  No results for input(s): INR in the last 168 hours.  No results found for: LABCA2  No results found for: CHY850  No results found for: YDX412  No results found for: INO676  No results found for: CA2729  No components found for: HGQUANT  No results found for: CEA1 / No results found for: CEA1   No results found for: AFPTUMOR  No results found for: CHROMOGRNA  No results found for: PSA1  Appointment on 10/23/2018  Component Date Value Ref Range Status  . Sodium 10/23/2018 137  135 - 145 mmol/L Final  . Potassium 10/23/2018 4.4   3.5 - 5.1 mmol/L Final  . Chloride 10/23/2018 101  98 - 111 mmol/L Final  . CO2 10/23/2018 25  22 - 32 mmol/L Final  . Glucose, Bld 10/23/2018 109* 70 -  99 mg/dL Final  . BUN 10/23/2018 14  8 - 23 mg/dL Final  . Creatinine 10/23/2018 0.88  0.44 - 1.00 mg/dL Final  . Calcium 10/23/2018 9.9  8.9 - 10.3 mg/dL Final  . Total Protein 10/23/2018 7.6  6.5 - 8.1 g/dL Final  . Albumin 10/23/2018 4.3  3.5 - 5.0 g/dL Final  . AST 10/23/2018 25  15 - 41 U/L Final  . ALT 10/23/2018 36  0 - 44 U/L Final  . Alkaline Phosphatase 10/23/2018 65  38 - 126 U/L Final  . Total Bilirubin 10/23/2018 0.4  0.3 - 1.2 mg/dL Final  . GFR, Est Non Af Am 10/23/2018 >60  >60 mL/min Final  . GFR, Est AFR Am 10/23/2018 >60  >60 mL/min Final  . Anion gap 10/23/2018 11  5 - 15 Final   Performed at [facility], [place] [address], [place], [place] [address]  . WBC Count 10/23/2018 7.7  4.0 - 10.5 K/uL Final  . RBC 10/23/2018 4.62  3.87 - 5.11 MIL/uL Final  . Hemoglobin 10/23/2018 13.8  12.0 - 15.0 g/dL Final  . HCT 10/23/2018 41.1  36.0 - 46.0 % Final  . MCV 10/23/2018 89.0  80.0 - 100.0 fL Final  . MCH 10/23/2018 29.9  26.0 - 34.0 pg Final  . MCHC 10/23/2018 33.6  30.0 - 36.0 g/dL Final  . RDW 10/23/2018 12.7  11.5 - 15.5 % Final  . Platelet Count 10/23/2018 203  150 - 400 K/uL Final  . nRBC 10/23/2018 0.0  0.0 - 0.2 % Final  . Neutrophils Relative % 10/23/2018 74  % Final  . Neutro Abs 10/23/2018 5.6  1.7 - 7.7 K/uL Final  . Lymphocytes Relative 10/23/2018 18  % Final  . Lymphs Abs 10/23/2018 1.4  0.7 - 4.0 K/uL Final  . Monocytes Relative 10/23/2018 5  % Final  . Monocytes Absolute 10/23/2018 0.4  0.1 - 1.0 K/uL Final  . Eosinophils Relative 10/23/2018 3  % Final  . Eosinophils Absolute 10/23/2018 0.2  0.0 - 0.5 K/uL Final  . Basophils Relative 10/23/2018 0  % Final  . Basophils Absolute 10/23/2018 0.0  0.0 - 0.1 K/uL Final  . Immature Granulocytes 10/23/2018 0  % Final  . Abs Immature  Granulocytes 10/23/2018 0.03  0.00 - 0.07 K/uL Final   Performed at [facility], [address] [place], [place] [address]    (this displays the last labs from the last 3 days)  No results found for: TOTALPROTELP, ALBUMINELP, A1GS, A2GS, BETS, BETA2SER, GAMS, MSPIKE, SPEI (this displays SPEP labs)  No results found for: KPAFRELGTCHN, LAMBDASER, KAPLAMBRATIO (kappa/lambda light chains)  No results found for: HGBA, HGBA2QUANT, HGBFQUANT, HGBSQUAN (Hemoglobinopathy evaluation)   No results found for: LDH  No results found for: IRON, TIBC, IRONPCTSAT (Iron and TIBC)  No results found for: FERRITIN  Urinalysis    Component Value Date/Time   COLORURINE Yellow 12/29/2008 0809   APPEARANCEUR CLEAR 12/29/2008 0809   LABSPEC 1.020 12/29/2008 0809   PHURINE 6.0 12/29/2008 0809   GLUCOSEU NEGATIVE 12/29/2008 0809   BILIRUBINUR NEGATIVE 12/29/2008 0809   KETONESUR NEGATIVE 12/29/2008 0809   UROBILINOGEN 0.2 12/29/2008 0809   NITRITE NEGATIVE 12/29/2008 0809   LEUKOCYTESUR NEGATIVE 12/29/2008 0809     STUDIES: No results found.   ELIGIBLE FOR AVAILABLE RESEARCH PROTOCOL: UPBEAT, ASA: Referral placed 02/28/2017   ASSESSMENT: 63 y.o. [place] woman s/p right breast lower inner quadrant biopsy 01/11/2017 for a cT1c cN0, stage IA  invasive ductal breast cancer, estrogen receptor positive, progesterone receptor negativem HER-2 not amplified, with an Mib-1 of 20%  (1) genetics testing 03/22/2017 through the Hereditary Gene Panel offered by [facility] found no deleterious mutations in APC, ATM, AXIN2, BARD1, BMPR1A, BRCA1, BRCA2, BRIP1, CDH1, CDK4, CDKN2A (p14ARF), CDKN2A (p16INK4a), CHEK2, CTNNA1, DICER1, EPCAM (Deletion/duplication testing only), GREM1 (promoter region deletion/duplication testing only), KIT, MEN1, MLH1, MSH2, MSH3, MSH6, MUTYH, NBN, NF1, NHTL1, PALB2, PDGFRA, PMS2, POLD1, POLE, PTEN, RAD50, RAD51C, RAD51D, SDHB, SDHC, SDHD, SMAD4, SMARCA4. STK11,  TP53, TSC1, TSC2, and VHL.  The following genes were evaluated for sequence changes only: SDHA and HOXB13 c.251G>A variant only.    (2) status post right lumpectomy and sentinel lymph node sampling 01/25/2017 for a pT1c pN0, stage Ia invasive ductal carcinoma, grade 2, with negative margins.  (3) Oncotype DX score of 42 predicts a 9-year risk of recurrence outside the breast of 30% if the patient's only systemic therapy is tamoxifen for 5 years.  It also predicts a significant chemotherapy benefit.  (4) adjuvant chemotherapy consisting of cyclophosphamide and doxorubicin in dose dense fashion x4 started 03/14/2017, completed 05/09/2017, followed by weekly paclitaxel x12 started 05/23/2017, completed 08/08/2017  (a) cycle 3 doxorubicin /cyclophosphamide delayed 1 week because of poor tolerance  (5) adjuvant radiation  08/31/2017 - 09/27/2017 Site/dose:    1. Right Breast / 40.05 Gy in 15 fractions 2. Right Breast Boost / 10 Gy in 5 fractions  (6) anastrozole started 10/12/2017  (a) bone density at [facility] 01/10/2018 was normal  PLAN: Kytzia is a little over a year and a half out from definitive surgery for her breast cancer with no evidence of disease recurrence.  This is favorable.  She is tolerating anastrozole well and the plan will be to continue that a minimum of 5 years.  She knows my recommendation in her case is to go to 7 years.  The stiffness in the middle finger of the right hand is not going to be related to anastrozole.  I suggested she could try a wrist splint although I doubt that it is carpal tunnel related.  On exam there is no evidence of gout.  If it worsens we can consider referral to hand specialist  Her left shoulder problems are consistent with bursitis and we discussed how to deal with that.  She will have mammography again in December.  She will see me again in March but if she moves to [place] as she is thinking of doing she might want to switch over to the [facility]  college the clinic there.  We will be glad to facilitate that at her choice  She knows to call for any other issue that may develop before her next visit here.  , Virgie Dad, MD  10/23/18 2:46 PM Medical Oncology and Hematology [facility] [place], [place] [address] Tel. [phone]    Fax. [phone]  I, Jacqualyn Posey am acting as a Education administrator for Chauncey Cruel, MD.   I, Lurline Del MD, have reviewed the above documentation for accuracy and completeness, and I agree with the above.

## 2018-10-23 ENCOUNTER — Inpatient Hospital Stay (HOSPITAL_BASED_OUTPATIENT_CLINIC_OR_DEPARTMENT_OTHER): Payer: BC Managed Care – PPO | Admitting: Oncology

## 2018-10-23 ENCOUNTER — Inpatient Hospital Stay: Payer: BC Managed Care – PPO | Attending: Oncology

## 2018-10-23 ENCOUNTER — Other Ambulatory Visit: Payer: Self-pay

## 2018-10-23 ENCOUNTER — Other Ambulatory Visit: Payer: Self-pay | Admitting: *Deleted

## 2018-10-23 VITALS — BP 132/85 | HR 81 | Temp 98.5°F | Resp 18 | Wt 231.5 lb

## 2018-10-23 DIAGNOSIS — Z79811 Long term (current) use of aromatase inhibitors: Secondary | ICD-10-CM | POA: Diagnosis not present

## 2018-10-23 DIAGNOSIS — Z79899 Other long term (current) drug therapy: Secondary | ICD-10-CM | POA: Diagnosis not present

## 2018-10-23 DIAGNOSIS — C50311 Malignant neoplasm of lower-inner quadrant of right female breast: Secondary | ICD-10-CM | POA: Diagnosis present

## 2018-10-23 DIAGNOSIS — Z170 Estrogen receptor positive status [ER+]: Secondary | ICD-10-CM | POA: Diagnosis not present

## 2018-10-23 LAB — CMP (CANCER CENTER ONLY)
ALT: 36 U/L (ref 0–44)
AST: 25 U/L (ref 15–41)
Albumin: 4.3 g/dL (ref 3.5–5.0)
Alkaline Phosphatase: 65 U/L (ref 38–126)
Anion gap: 11 (ref 5–15)
BUN: 14 mg/dL (ref 8–23)
CO2: 25 mmol/L (ref 22–32)
Calcium: 9.9 mg/dL (ref 8.9–10.3)
Chloride: 101 mmol/L (ref 98–111)
Creatinine: 0.88 mg/dL (ref 0.44–1.00)
GFR, Est AFR Am: >60 mL/min (ref >60)
GFR, Estimated: >60 mL/min (ref >60)
Glucose, Bld: 109 mg/dL — ABNORMAL HIGH (ref 70–99)
Potassium: 4.4 mmol/L (ref 3.5–5.1)
Sodium: 137 mmol/L (ref 135–145)
Total Bilirubin: 0.4 mg/dL (ref 0.3–1.2)
Total Protein: 7.6 g/dL (ref 6.5–8.1)

## 2018-10-23 LAB — CBC WITH DIFFERENTIAL (CANCER CENTER ONLY)
Abs Immature Granulocytes: 0.03 10*3/uL (ref 0.00–0.07)
Basophils Absolute: 0 10*3/uL (ref 0.0–0.1)
Basophils Relative: 0 %
Eosinophils Absolute: 0.2 10*3/uL (ref 0.0–0.5)
Eosinophils Relative: 3 %
HCT: 41.1 % (ref 36.0–46.0)
Hemoglobin: 13.8 g/dL (ref 12.0–15.0)
Immature Granulocytes: 0 %
Lymphocytes Relative: 18 %
Lymphs Abs: 1.4 10*3/uL (ref 0.7–4.0)
MCH: 29.9 pg (ref 26.0–34.0)
MCHC: 33.6 g/dL (ref 30.0–36.0)
MCV: 89 fL (ref 80.0–100.0)
Monocytes Absolute: 0.4 10*3/uL (ref 0.1–1.0)
Monocytes Relative: 5 %
Neutro Abs: 5.6 10*3/uL (ref 1.7–7.7)
Neutrophils Relative %: 74 %
Platelet Count: 203 10*3/uL (ref 150–400)
RBC: 4.62 MIL/uL (ref 3.87–5.11)
RDW: 12.7 % (ref 11.5–15.5)
WBC Count: 7.7 10*3/uL (ref 4.0–10.5)
nRBC: 0 % (ref 0.0–0.2)

## 2018-10-23 MED ORDER — METOPROLOL SUCCINATE ER 100 MG PO TB24: ORAL_TABLET | ORAL | 1 refills | Status: DC

## 2018-10-24 ENCOUNTER — Telehealth: Payer: Self-pay | Admitting: Oncology

## 2018-10-24 NOTE — Telephone Encounter (Signed)
I could not reach patient, mailbox was full

## 2018-10-30 ENCOUNTER — Other Ambulatory Visit: Payer: Self-pay

## 2018-10-30 MED ORDER — SERTRALINE HCL 100 MG PO TABS: ORAL_TABLET | ORAL | 1 refills | Status: DC

## 2019-03-01 ENCOUNTER — Other Ambulatory Visit: Payer: Self-pay | Admitting: Oncology

## 2019-04-13 ENCOUNTER — Other Ambulatory Visit: Payer: Self-pay | Admitting: Internal Medicine

## 2019-04-13 DIAGNOSIS — I10 Essential (primary) hypertension: Secondary | ICD-10-CM

## 2019-04-18 ENCOUNTER — Encounter: Payer: Self-pay | Admitting: Internal Medicine

## 2019-04-19 NOTE — Telephone Encounter (Signed)
No note needed 

## 2019-04-22 ENCOUNTER — Other Ambulatory Visit: Payer: Self-pay | Admitting: *Deleted

## 2019-04-22 ENCOUNTER — Other Ambulatory Visit: Payer: Self-pay

## 2019-04-22 ENCOUNTER — Ambulatory Visit (INDEPENDENT_AMBULATORY_CARE_PROVIDER_SITE_OTHER): Payer: BC Managed Care – PPO | Admitting: Internal Medicine

## 2019-04-22 ENCOUNTER — Encounter: Payer: Self-pay | Admitting: Internal Medicine

## 2019-04-22 VITALS — BP 136/82 | HR 73 | Temp 98.6°F | Ht 64.0 in | Wt 226.2 lb

## 2019-04-22 DIAGNOSIS — E785 Hyperlipidemia, unspecified: Secondary | ICD-10-CM | POA: Diagnosis not present

## 2019-04-22 DIAGNOSIS — C50311 Malignant neoplasm of lower-inner quadrant of right female breast: Secondary | ICD-10-CM

## 2019-04-22 DIAGNOSIS — Z0000 Encounter for general adult medical examination without abnormal findings: Secondary | ICD-10-CM

## 2019-04-22 DIAGNOSIS — F320 Major depressive disorder, single episode, mild: Secondary | ICD-10-CM | POA: Diagnosis not present

## 2019-04-22 DIAGNOSIS — R739 Hyperglycemia, unspecified: Secondary | ICD-10-CM | POA: Diagnosis not present

## 2019-04-22 DIAGNOSIS — Z170 Estrogen receptor positive status [ER+]: Secondary | ICD-10-CM

## 2019-04-22 LAB — CBC
HCT: 39.3 % (ref 36.0–46.0)
Hemoglobin: 13.4 g/dL (ref 12.0–15.0)
MCHC: 34.1 g/dL (ref 30.0–36.0)
MCV: 90 fl (ref 78.0–100.0)
Platelets: 232 10*3/uL (ref 150.0–400.0)
RBC: 4.37 Mil/uL (ref 3.87–5.11)
RDW: 13.3 % (ref 11.5–15.5)
WBC: 8.9 10*3/uL (ref 4.0–10.5)

## 2019-04-22 LAB — COMPREHENSIVE METABOLIC PANEL
ALT: 21 U/L (ref 0–35)
AST: 18 U/L (ref 0–37)
Albumin: 4.3 g/dL (ref 3.5–5.2)
Alkaline Phosphatase: 71 U/L (ref 39–117)
BUN: 15 mg/dL (ref 6–23)
CO2: 26 mEq/L (ref 19–32)
Calcium: 10 mg/dL (ref 8.4–10.5)
Chloride: 98 mEq/L (ref 96–112)
Creatinine, Ser: 0.72 mg/dL (ref 0.40–1.20)
GFR: 81.92 mL/min (ref >60.00)
Glucose, Bld: 100 mg/dL — ABNORMAL HIGH (ref 70–99)
Potassium: 4.2 mEq/L (ref 3.5–5.1)
Sodium: 133 mEq/L — ABNORMAL LOW (ref 135–145)
Total Bilirubin: 0.5 mg/dL (ref 0.2–1.2)
Total Protein: 7.8 g/dL (ref 6.0–8.3)

## 2019-04-22 LAB — LIPID PANEL
Cholesterol: 240 mg/dL — ABNORMAL HIGH (ref 0–200)
HDL: 43.6 mg/dL (ref >39.00)
LDL Cholesterol: 170 mg/dL — ABNORMAL HIGH (ref 0–99)
NonHDL: 196.39
Total CHOL/HDL Ratio: 6
Triglycerides: 134 mg/dL (ref 0.0–149.0)
VLDL: 26.8 mg/dL (ref 0.0–40.0)

## 2019-04-22 LAB — HEMOGLOBIN A1C: Hgb A1c MFr Bld: 5.4 % (ref 4.6–6.5)

## 2019-04-22 NOTE — Assessment & Plan Note (Signed)
Checking HgA1c. Adjust as needed. 

## 2019-04-22 NOTE — Patient Instructions (Signed)
Health Maintenance, Female Adopting a healthy lifestyle and getting preventive care are important in promoting health and wellness. Ask your health care provider about:  The right schedule for you to have regular tests and exams.  Things you can do on your own to prevent diseases and keep yourself healthy. What should I know about diet, weight, and exercise? Eat a healthy diet   Eat a diet that includes plenty of vegetables, fruits, low-fat dairy products, and lean protein.  Do not eat a lot of foods that are high in solid fats, added sugars, or sodium. Maintain a healthy weight Body mass index (BMI) is used to identify weight problems. It estimates body fat based on height and weight. Your health care provider can help determine your BMI and help you achieve or maintain a healthy weight. Get regular exercise Get regular exercise. This is one of the most important things you can do for your health. Most adults should:  Exercise for at least 150 minutes each week. The exercise should increase your heart rate and make you sweat (moderate-intensity exercise).  Do strengthening exercises at least twice a week. This is in addition to the moderate-intensity exercise.  Spend less time sitting. Even light physical activity can be beneficial. Watch cholesterol and blood lipids Have your blood tested for lipids and cholesterol at 63 years of age, then have this test every 5 years. Have your cholesterol levels checked more often if:  Your lipid or cholesterol levels are high.  You are older than 63 years of age.  You are at high risk for heart disease. What should I know about cancer screening? Depending on your health history and family history, you may need to have cancer screening at various ages. This may include screening for:  Breast cancer.  Cervical cancer.  Colorectal cancer.  Skin cancer.  Lung cancer. What should I know about heart disease, diabetes, and high blood  pressure? Blood pressure and heart disease  High blood pressure causes heart disease and increases the risk of stroke. This is more likely to develop in people who have high blood pressure readings, are of African descent, or are overweight.  Have your blood pressure checked: ? Every 3-5 years if you are 18-39 years of age. ? Every year if you are 40 years old or older. Diabetes Have regular diabetes screenings. This checks your fasting blood sugar level. Have the screening done:  Once every three years after age 40 if you are at a normal weight and have a low risk for diabetes.  More often and at a younger age if you are overweight or have a high risk for diabetes. What should I know about preventing infection? Hepatitis B If you have a higher risk for hepatitis B, you should be screened for this virus. Talk with your health care provider to find out if you are at risk for hepatitis B infection. Hepatitis C Testing is recommended for:  Everyone born from 1945 through 1965.  Anyone with known risk factors for hepatitis C. Sexually transmitted infections (STIs)  Get screened for STIs, including gonorrhea and chlamydia, if: ? You are sexually active and are younger than 63 years of age. ? You are older than 63 years of age and your health care provider tells you that you are at risk for this type of infection. ? Your sexual activity has changed since you were last screened, and you are at increased risk for chlamydia or gonorrhea. Ask your health care provider if   you are at risk.  Ask your health care provider about whether you are at high risk for HIV. Your health care provider may recommend a prescription medicine to help prevent HIV infection. If you choose to take medicine to prevent HIV, you should first get tested for HIV. You should then be tested every 3 months for as long as you are taking the medicine. Pregnancy  If you are about to stop having your period (premenopausal) and  you may become pregnant, seek counseling before you get pregnant.  Take 400 to 800 micrograms (mcg) of folic acid every day if you become pregnant.  Ask for birth control (contraception) if you want to prevent pregnancy. Osteoporosis and menopause Osteoporosis is a disease in which the bones lose minerals and strength with aging. This can result in bone fractures. If you are 65 years old or older, or if you are at risk for osteoporosis and fractures, ask your health care provider if you should:  Be screened for bone loss.  Take a calcium or vitamin D supplement to lower your risk of fractures.  Be given hormone replacement therapy (HRT) to treat symptoms of menopause. Follow these instructions at home: Lifestyle  Do not use any products that contain nicotine or tobacco, such as cigarettes, e-cigarettes, and chewing tobacco. If you need help quitting, ask your health care provider.  Do not use street drugs.  Do not share needles.  Ask your health care provider for help if you need support or information about quitting drugs. Alcohol use  Do not drink alcohol if: ? Your health care provider tells you not to drink. ? You are pregnant, may be pregnant, or are planning to become pregnant.  If you drink alcohol: ? Limit how much you use to 0-1 drink a day. ? Limit intake if you are breastfeeding.  Be aware of how much alcohol is in your drink. In the [place] one drink equals one 12 oz bottle of beer (355 mL), one 5 oz glass of wine (148 mL), or one 1 oz glass of hard liquor (44 mL). General instructions  Schedule regular health, dental, and eye exams.  Stay current with your vaccines.  Tell your health care provider if: ? You often feel depressed. ? You have ever been abused or do not feel safe at home. Summary  Adopting a healthy lifestyle and getting preventive care are important in promoting health and wellness.  Follow your health care provider's instructions about healthy  diet, exercising, and getting tested or screened for diseases.  Follow your health care provider's instructions on monitoring your cholesterol and blood pressure. This information is not intended to replace advice given to you by your health care provider. Make sure you discuss any questions you have with your health care provider. Document Revised: 01/17/2018 Document Reviewed: 01/17/2018 Elsevier Patient Education  2020 Elsevier Inc.  

## 2019-04-22 NOTE — Assessment & Plan Note (Signed)
Zoloft 50 mg daily still helping well. Coping well with pandemic.

## 2019-04-22 NOTE — Assessment & Plan Note (Signed)
Flu shot up to date. Shingrix counseled declined as hoping to get covid-19 vaccination soon. Tetanus defers due to hopeful upcoming covid-19 vaccine. Colonoscopy due 2013. Mammogram up to date although we do not have records, pap smear up to date with gyn although we need records. Counseled about sun safety and mole surveillance. Counseled about the dangers of distracted driving. Given 10 year screening recommendations.

## 2019-04-22 NOTE — Progress Notes (Signed)
   Subjective:   Patient ID: Shelby Payne, female    DOB: 1955-03-06, 64 y.o.   MRN: [id number]  HPI The patient is a 64 YO female coming in for physical.   PMH, [place], social history reviewed and updated  Review of Systems  Constitutional: Negative.   HENT: Negative.   Eyes: Positive for visual disturbance.  Respiratory: Negative for cough, chest tightness and shortness of breath.   Cardiovascular: Negative for chest pain, palpitations and leg swelling.  Gastrointestinal: Negative for abdominal distention, abdominal pain, constipation, diarrhea, nausea and vomiting.  Musculoskeletal: Negative.   Skin: Negative.   Neurological: Negative.   Psychiatric/Behavioral: Negative.     Objective:  Physical Exam Constitutional:      Appearance: She is well-developed. She is obese.  HENT:     Head: Normocephalic and atraumatic.  Cardiovascular:     Rate and Rhythm: Normal rate and regular rhythm.  Pulmonary:     Effort: Pulmonary effort is normal. No respiratory distress.     Breath sounds: Normal breath sounds. No wheezing or rales.  Abdominal:     General: Bowel sounds are normal. There is no distension.     Palpations: Abdomen is soft.     Tenderness: There is no abdominal tenderness. There is no rebound.  Musculoskeletal:     Cervical back: Normal range of motion.  Skin:    General: Skin is warm and dry.  Neurological:     Mental Status: She is alert and oriented to person, place, and time.     Coordination: Coordination normal.     Vitals:   04/22/19 1359  BP: 136/82  Pulse: 73  Temp: 98.6 F (37 C)  TempSrc: Oral  SpO2: 97%  Weight: 226 lb 4 oz (102.6 kg)  Height: 5\' 4"  (1.626 m)    This visit occurred during the SARS-CoV-2 public health emergency.  Safety protocols were in place, including screening questions prior to the visit, additional usage of staff PPE, and extensive cleaning of exam room while observing appropriate contact time as indicated for  disinfecting solutions.   Assessment & Plan:

## 2019-04-22 NOTE — Assessment & Plan Note (Signed)
Not on meds except otc fish oil. Checking lipid panel and adjust as needed.

## 2019-04-22 NOTE — Progress Notes (Signed)
[facility]  [phone] [phone]     ID: Shelby Payne DOB: 63-31-1957  MR#: [id number]  AJG#:[id number]  Patient Care Team: Hoyt Koch, MD as PCP - General (Internal Medicine) Magrinat, Virgie Dad, MD as Consulting Physician (Oncology) Eppie Gibson, MD as Attending Physician (Radiation Oncology) Erroll Luna, MD as Consulting Physician (General Surgery) Christene Slates, MD as Physician Assistant (Radiology) OTHER MD:   CHIEF COMPLAINT: Estrogen receptor positive breast cancer  CURRENT TREATMENT: Anastrozole   INTERVAL HISTORY: [name] returns today for follow-up of her estrogen receptor positive breast cancer.   Shelby Payne continues on anastrozole.  While Shelby Payne has some arthralgias in her left shoulder, third right digit and the left knee, these are unlikely to be due to the anastrozole.  Vaginal dryness is not an issue.  [facility]'s last bone density screening on 01/10/2018, showed a T-score of -0.9, which is considered normal.    Since her last visit, Shelby Payne underwent bilateral diagnostic mammography with tomography at [facility] on 02/14/2019.  Showing: breast density category A; no evidence of malignancy in either breast   REVIEW OF SYSTEMS: Oneda just sold her house here in town and bought one near [place].  Shelby Payne is in the process of moving.  Shelby Payne is very excited about this and incidentally Shelby Payne will be living right next door to Dr. Lanell Persons in-laws.  [name] has not found a physician in that area yet and is not sure whether Shelby Payne is going to do that anytime soon Shelby Payne tells me.  Shelby Payne is looking forward to receiving her first Covid vaccine dose either tomorrow or day after tomorrow.   HISTORY OF CURRENT ILLNESS: From the original intake note:  The patient had bilateral screening mammography at [facility] 12/28/2016.  There was a new mass in the right breast lower inner quadrant and some new grouped calcifications in the right breast same quadrant.  Accordingly  the patient was called back for right diagnostic mammography and ultrasonography 01/09/2017.  The breast density was category A.  In the right breast lower inner quadrant there was a new irregular mass.  Again noted was a new group of heterogeneous calcifications in the same quadrant.  Ultrasound confirmed a 1.7 cm irregular mass in the right breast lower inner quadrant.  There were no abnormalities sonographically in the right axilla.  Taken together,  the mass plus calcifications measured up to 4 cm.  Accordingly on 01/11/2017 Shelby Payne underwent biopsy of the right breast mass in question, showing [phone]) invasive ductal carcinoma, grade 2, estrogen receptor 90% positive with strong staining intensity, progesterone receptor negative, with an Mib-1 of 20% and no HER-2 amplification, the signals ratio being 1.26 and the number per cell 2.20  The patient's subsequent history is as detailed below.   PAST MEDICAL HISTORY: Past Medical History:  Diagnosis Date  . Acute sinusitis, unspecified   . Anxiety   . Cancer ([place]) 12/2016   right breast cancer  . Depression   . Family history of prostate cancer   . History of radiation therapy 08/31/17- 09/27/17   Right Breast/ 40.05 Gy in 15 fractions, Right Breast Boost/ 10 Gy in 5 fractions.   . Hypertension   . Obesity, unspecified   . Pneumonia, organism unspecified(486)     PAST SURGICAL HISTORY: Past Surgical History:  Procedure Laterality Date  . BREAST LUMPECTOMY WITH RADIOACTIVE SEED AND SENTINEL LYMPH NODE BIOPSY Right 01/25/2017   Procedure: RIGHT BREAST LUMPECTOMY  WITH 2 RADIOACTIVE SEEDS AND SENTINEL LYMPH NODE BIOPSY;  Surgeon: Erroll Luna, MD;  Location: [facility];  Service: General;  Laterality: Right;  . PORTACATH PLACEMENT Right 03/13/2017   Procedure: ULTRASOUND GUIDED INSERTION PORT-A-CATH RIGHT INTERNAL JUGULAR;  Surgeon: Erroll Luna, MD;  Location: [facility];  Service: General;  Laterality: Right;  . WISDOM  TOOTH EXTRACTION      FAMILY HISTORY Family History  Problem Relation Age of Onset  . Hypertension Mother   . Hypothyroidism Mother   . Hyperlipidemia Mother   . Gout Mother   . Breast cancer Mother 7  . Heart disease Father   . Hypertension Father   . Diabetes Father   . Hypothyroidism Father   . Prostate cancer Father        dx late 52s  . Hypothyroidism Sister   . Cancer Sister        angiosarcoma - mets  . Esophageal cancer Maternal Uncle   . Stroke Paternal Aunt   . Stroke Paternal Uncle   . Heart attack Maternal Grandfather   . ALS Paternal Grandfather   . COPD Neg Hx   . Colon cancer Neg Hx   . Rectal cancer Neg Hx   . Stomach cancer Neg Hx   Her father passed away from a massive stroke at 53. Her mother passed away from complications of breast cancer at 70. Shelby Payne had been diagnosed age 17. The patient's sister had angiosarcoma at 96. There is no otherhistory of breast or ovarian cancer in this famiy. The patient is of Ashkenazi extraction   GYNECOLOGIC HISTORY:  Patient's last menstrual period was 07/12/2011.  Menarche age 64, first live birth age 64, Shelby Payne is GXP2. 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 Her husband, [name] was a Curator at [employer] in [employer], but is retired now. Her daughter [name] teaches nursing at [employer]. [name] is concerned that on her father's side a cousin has tested positive for a deleterious gene; Shelby Payne will try to bring [name] that information]. Son, [name] passed away. The patient does not have any grandchildren. Shelby Payne attends [employer].     ADVANCED DIRECTIVES: In the absence of any documentation to the contrary, the patient's spouse is their HCPOA.    HEALTH MAINTENANCE: Social History   Tobacco Use  . Smoking status: Never Smoker  . Smokeless tobacco: Never Used  Substance Use Topics  . Alcohol use: Yes     Comment: rare glass of wine  . Drug use: No     Colonoscopy: 2013  PAP:  Bone density: Due December 2019 at [facility] Reactions  . Hctz [Hydrochlorothiazide] Other (See Comments)    Hyponatremia  . Bactrim [Sulfamethoxazole-Trimethoprim] Rash    Current Outpatient Medications  Medication Sig Dispense Refill  . anastrozole (ARIMIDEX) 1 MG tablet TAKE 1 TABLET(1 MG) BY MOUTH DAILY 90 tablet 4  . b complex vitamins tablet Take 1 tablet by mouth daily.    . Coenzyme Q10 (CO Q-10 PO) Take 1 capsule by mouth daily.     . metoprolol succinate (TOPROL-XL) 100 MG 24 hr tablet TAKE 1 TABLET(100 MG) BY MOUTH DAILY 90 tablet 1  . Omega-3 Fatty Acids (FISH OIL PO) Take 1 capsule by mouth daily.     . sertraline (ZOLOFT) 100 MG tablet TAKE 1 TABLET(100 MG) BY MOUTH DAILY 90 tablet 1  . Telmisartan-amLODIPine 40-5 MG TABS TAKE 1 TABLET BY MOUTH  DAILY 90 tablet 1  . triamcinolone cream (KENALOG) 0.1 % Apply 1 application 2 (two) times daily topically. (Patient taking differently: Apply 1 application topically 2 (two) times daily as needed (for rash). ) 30 g 6   No current facility-administered medications for this visit.    OBJECTIVE: Middle-aged white woman who appears younger than stated age  30:   04/23/19 1418  BP: (!) 141/68  Pulse: 87  Resp: 18  Temp: 98.5 F (36.9 C)  SpO2: 96%   Wt Readings from Last 3 Encounters:  04/23/19 226 lb 8 oz (102.7 kg)  04/22/19 226 lb 4 oz (102.6 kg)  10/23/18 231 lb 8 oz (105 kg)   Body mass index is 38.88 kg/m.    ECOG FS:1 - Symptomatic but completely ambulatory  Sclerae unicteric, EOMs intact Wearing a mask No cervical or supraclavicular adenopathy Lungs no rales or rhonchi Heart regular rate and rhythm Abd soft, nontender, positive bowel sounds MSK no focal spinal tenderness, no upper extremity lymphedema Neuro: nonfocal, well oriented, appropriate affect Breasts: The right breast has undergone lumpectomy  followed by radiation.  The cosmetic result is excellent--it is practically impossible to noticed the scar unless you know it is there.  There is no evidence of local recurrence.  The left breast is benign.  Both axillae are benign.   LAB RESULTS:  CMP     Component Value Date/Time   NA 133 (L) 04/22/2019 1432   NA 137 01/18/2017 1238   K 4.2 04/22/2019 1432   K 4.1 01/18/2017 1238   CL 98 04/22/2019 1432   CO2 26 04/22/2019 1432   CO2 22 01/18/2017 1238   GLUCOSE 100 (H) 04/22/2019 1432   GLUCOSE 91 01/18/2017 1238   BUN 15 04/22/2019 1432   BUN 19.5 01/18/2017 1238   CREATININE 0.72 04/22/2019 1432   CREATININE 0.88 10/23/2018 1402   CREATININE 0.7 01/18/2017 1238   CALCIUM 10.0 04/22/2019 1432   CALCIUM 9.8 01/18/2017 1238   PROT 7.8 04/22/2019 1432   PROT 7.9 01/18/2017 1238   ALBUMIN 4.3 04/22/2019 1432   ALBUMIN 4.3 01/18/2017 1238   AST 18 04/22/2019 1432   AST 25 10/23/2018 1402   AST 19 01/18/2017 1238   ALT 21 04/22/2019 1432   ALT 36 10/23/2018 1402   ALT 24 01/18/2017 1238   ALKPHOS 71 04/22/2019 1432   ALKPHOS 53 01/18/2017 1238   BILITOT 0.5 04/22/2019 1432   BILITOT 0.4 10/23/2018 1402   BILITOT 0.39 01/18/2017 1238   GFRNONAA >60 10/23/2018 1402   GFRAA >60 10/23/2018 1402    No results found for: TOTALPROTELP, ALBUMINELP, A1GS, A2GS, BETS, BETA2SER, GAMS, MSPIKE, SPEI  No results found for: KPAFRELGTCHN, LAMBDASER, KAPLAMBRATIO  Lab Results  Component Value Date   WBC 8.9 04/22/2019   NEUTROABS 5.6 10/23/2018   HGB 13.4 04/22/2019   HCT 39.3 04/22/2019   MCV 90.0 04/22/2019   PLT 232.0 04/22/2019   No results found for: LABCA2  No components found for: [id number]  No results for input(s): INR in the last 168 hours.  No results found for: LABCA2  No results found for: OHY073  No results found for: XTG626  No results found for: RSW546  No results found for: CA2729  No components found for: HGQUANT  No results found for: CEA1 / No  results found for: CEA1   No results found for: AFPTUMOR  No results found for: [place]  No results found for: HGBA, HGBA2QUANT, HGBFQUANT, HGBSQUAN (Hemoglobinopathy evaluation)  No results found for: LDH  No results found for: IRON, TIBC, IRONPCTSAT (Iron and TIBC)  No results found for: FERRITIN  Urinalysis    Component Value Date/Time   COLORURINE Yellow 12/29/2008 0809   APPEARANCEUR CLEAR 12/29/2008 0809   LABSPEC 1.020 12/29/2008 0809   PHURINE 6.0 12/29/2008 0809   GLUCOSEU NEGATIVE 12/29/2008 0809   BILIRUBINUR NEGATIVE 12/29/2008 0809   KETONESUR NEGATIVE 12/29/2008 0809   UROBILINOGEN 0.2 12/29/2008 0809   NITRITE NEGATIVE 12/29/2008 0809   LEUKOCYTESUR NEGATIVE 12/29/2008 0809    STUDIES: No results found.   ELIGIBLE FOR AVAILABLE RESEARCH PROTOCOL: UPBEAT, ASA: Referral placed 02/28/2017   ASSESSMENT: 64 y.o. [place] woman s/p right breast lower inner quadrant biopsy 01/11/2017 for a cT1c cN0, stage IA invasive ductal breast cancer, estrogen receptor positive, progesterone receptor negativem HER-2 not amplified, with an Mib-1 of 20%  (1) genetics testing 03/22/2017 through the Hereditary Gene Panel offered by [facility] found no deleterious mutations in APC, ATM, AXIN2, BARD1, BMPR1A, BRCA1, BRCA2, BRIP1, CDH1, CDK4, CDKN2A (p14ARF), CDKN2A (p16INK4a), CHEK2, CTNNA1, DICER1, EPCAM (Deletion/duplication testing only), GREM1 (promoter region deletion/duplication testing only), KIT, MEN1, MLH1, MSH2, MSH3, MSH6, MUTYH, NBN, NF1, NHTL1, PALB2, PDGFRA, PMS2, POLD1, POLE, PTEN, RAD50, RAD51C, RAD51D, SDHB, SDHC, SDHD, SMAD4, SMARCA4. STK11, TP53, TSC1, TSC2, and VHL.  The following genes were evaluated for sequence changes only: SDHA and HOXB13 c.251G>A variant only.    (2) status post right lumpectomy and sentinel lymph node sampling 01/25/2017 for a pT1c pN0, stage Ia invasive ductal carcinoma, grade 2, with negative margins.  (3) Oncotype DX score of 42  predicts a 9-year risk of recurrence outside the breast of 30% if the patient's only systemic therapy is tamoxifen for 5 years.  It also predicts a significant chemotherapy benefit.  (4) adjuvant chemotherapy consisting of cyclophosphamide and doxorubicin in dose dense fashion x4 started 03/14/2017, completed 05/09/2017, followed by weekly paclitaxel x12 started 05/23/2017, completed 08/08/2017  (a) cycle 3 doxorubicin /cyclophosphamide delayed 1 week because of poor tolerance  (5) adjuvant radiation  08/31/2017 - 09/27/2017 Site/dose:    1. Right Breast / 40.05 Gy in 15 fractions 2. Right Breast Boost / 10 Gy in 5 fractions  (6) anastrozole started 10/12/2017  (a) bone density at [facility] 01/10/2018 was normal, T score - 0.9   PLAN: Shelby Payne is a little over 2 years out from definitive surgery for her breast cancer with no evidence of disease recurrence.  This is very favorable.  Shelby Payne is tolerating anastrozole well and the plan will be to continue that a minimum of 5 years.  Shelby Payne has normal bone density.  Shelby Payne understands there is a natural loss of bone density with age and anastrozole can accelerate that.  We will repeat a bone density January 2022.  In the meantime Shelby Payne will continue her vitamin D and increase her walking.  Her LDL is elevated.  I think Shelby Payne will need a statin.  Shelby Payne tells me her primary care physician Dr. Sharlet Salina will be contacting her later on regarding lab results.  Just in case Shelby Payne does change physicians I gave her a copy of her most recent mammogram and the assessment above which includes that her entire breast cancer history.  Otherwise I plan to see her again in a year after her next mammogram January 2022.  Total encounter time 25 minutes.*  Magrinat, Virgie Dad, MD  04/23/19 2:41 PM Medical Oncology and Hematology [facility] [place], [place] [address] Tel. [phone]  Fax. [phone]   I, Wilburn Mylar, am acting as scribe for  Dr. Virgie Dad. Magrinat.  I, Lurline Del MD, have reviewed the above documentation for accuracy and completeness, and I agree with the above.    *Total Encounter Time as defined by the Centers for Medicare and Medicaid Services includes, in addition to the face-to-face time of a patient visit (documented in the note above) non-face-to-face time: obtaining and reviewing outside history, ordering and reviewing medications, tests or procedures, care coordination (communications with other health care professionals or caregivers) and documentation in the medical record.

## 2019-04-22 NOTE — Assessment & Plan Note (Signed)
Still on arimidex and seeing oncology. Most recent mammogram at [facility] Dec 2020 and we do not have records but she reports normal.

## 2019-04-23 ENCOUNTER — Other Ambulatory Visit: Payer: Self-pay | Admitting: Internal Medicine

## 2019-04-23 ENCOUNTER — Other Ambulatory Visit: Payer: Self-pay

## 2019-04-23 ENCOUNTER — Inpatient Hospital Stay: Payer: BC Managed Care – PPO

## 2019-04-23 ENCOUNTER — Inpatient Hospital Stay: Payer: BC Managed Care – PPO | Attending: Oncology | Admitting: Oncology

## 2019-04-23 VITALS — BP 141/68 | HR 87 | Temp 98.5°F | Resp 18 | Ht 64.0 in | Wt 226.5 lb

## 2019-04-23 DIAGNOSIS — Z170 Estrogen receptor positive status [ER+]: Secondary | ICD-10-CM | POA: Diagnosis not present

## 2019-04-23 DIAGNOSIS — I10 Essential (primary) hypertension: Secondary | ICD-10-CM | POA: Diagnosis not present

## 2019-04-23 DIAGNOSIS — Z79899 Other long term (current) drug therapy: Secondary | ICD-10-CM | POA: Diagnosis not present

## 2019-04-23 DIAGNOSIS — E669 Obesity, unspecified: Secondary | ICD-10-CM | POA: Insufficient documentation

## 2019-04-23 DIAGNOSIS — C50311 Malignant neoplasm of lower-inner quadrant of right female breast: Secondary | ICD-10-CM | POA: Insufficient documentation

## 2019-04-23 DIAGNOSIS — Z79811 Long term (current) use of aromatase inhibitors: Secondary | ICD-10-CM | POA: Diagnosis not present

## 2019-04-23 DIAGNOSIS — Z923 Personal history of irradiation: Secondary | ICD-10-CM | POA: Insufficient documentation

## 2019-04-23 DIAGNOSIS — F329 Major depressive disorder, single episode, unspecified: Secondary | ICD-10-CM | POA: Diagnosis not present

## 2019-04-24 ENCOUNTER — Telehealth: Payer: Self-pay | Admitting: Oncology

## 2019-04-24 NOTE — Telephone Encounter (Signed)
Scheduled appts per 3/16 los. Unable to leave voicemail with appt details, Pt's voicemail box was full. Mailed reminder letter and calendar.

## 2019-04-25 ENCOUNTER — Other Ambulatory Visit: Payer: Self-pay

## 2019-04-25 NOTE — Telephone Encounter (Signed)
Is there a question about these refills?

## 2019-04-26 NOTE — Telephone Encounter (Signed)
erx sent

## 2019-10-05 ENCOUNTER — Other Ambulatory Visit: Payer: Self-pay | Admitting: Internal Medicine

## 2019-10-06 ENCOUNTER — Other Ambulatory Visit: Payer: Self-pay | Admitting: Internal Medicine

## 2019-10-11 ENCOUNTER — Other Ambulatory Visit: Payer: Self-pay | Admitting: Internal Medicine

## 2019-10-11 DIAGNOSIS — I10 Essential (primary) hypertension: Secondary | ICD-10-CM

## 2020-04-02 ENCOUNTER — Other Ambulatory Visit: Payer: Self-pay | Admitting: Internal Medicine

## 2020-04-02 ENCOUNTER — Other Ambulatory Visit: Payer: Self-pay | Admitting: Oncology

## 2020-04-09 ENCOUNTER — Telehealth: Payer: Self-pay | Admitting: Internal Medicine

## 2020-04-09 DIAGNOSIS — I10 Essential (primary) hypertension: Secondary | ICD-10-CM

## 2020-04-17 MED ORDER — TELMISARTAN-AMLODIPINE 40-5 MG PO TABS: 1.0000 | ORAL_TABLET | Freq: Every day | ORAL | 1 refills | Status: DC

## 2020-04-17 NOTE — Telephone Encounter (Signed)
Patient is requesting a short supply of Telmisartan-amLODIPine 40-5 MG TABS. Her next OV is 3.25.22. Please advise    [id number] - [place], [place] - [name]

## 2020-04-17 NOTE — Telephone Encounter (Signed)
Medication has been sent to the patient's pharmacy.  

## 2020-04-17 NOTE — Addendum Note (Signed)
Addended by: Thomes Cake on: 04/17/2020 02:32 PM   Modules accepted: Orders

## 2020-04-22 NOTE — Progress Notes (Signed)
[place]  [phone] [phone]     ID: Shelby Payne DOB: 06/27/1955  MR#: [id number]  OQH#:[id number]  Patient Care Team: Hoyt Koch, MD as PCP - General (Internal Medicine) Barak Bialecki, Virgie Dad, MD as Consulting Physician (Oncology) Eppie Gibson, MD as Attending Physician (Radiation Oncology) Erroll Luna, MD as Consulting Physician (General Surgery) Christene Slates, MD as Physician Assistant (Radiology) OTHER MD:   CHIEF COMPLAINT: Estrogen receptor positive breast cancer  CURRENT TREATMENT: Anastrozole   INTERVAL HISTORY: [name] returns today for follow-up of her estrogen receptor positive breast cancer.   She continues on anastrozole.  She has rare hot flushes which are minimal.  They do not wake her up at night.  She has no problems with vaginal dryness or arthralgias/myalgias.  Shelby Payne's last bone density screening on 01/10/2018, showed a T-score of -0.9, which is considered normal.    Since her last visit, she underwent bilateral diagnostic mammography with tomography at [facility] on 02/14/2019 showing: breast density category A; no evidence of malignancy in either breast.. She is scheduled for mammography at [facility] 04/28/2020, with ultrasound same day   REVIEW OF SYSTEMS: Shelby Payne has moved to [place] to be near her daughter.  She enjoys family, playing wingspan and is walking about 1-1/2 miles most days together with her husband.  A detailed review of systems today was stable  COVID 19 VACCINATION STATUS: Status post Moderna x2 with booster November 2021  HISTORY OF CURRENT ILLNESS: From the original intake note:  The patient had bilateral screening mammography at [facility] 12/28/2016.  There was a new mass in the right breast lower inner quadrant and some new grouped calcifications in the right breast same quadrant.  Accordingly the patient was called back for right diagnostic mammography and ultrasonography  01/09/2017.  The breast density was category A.  In the right breast lower inner quadrant there was a new irregular mass.  Again noted was a new group of heterogeneous calcifications in the same quadrant.  Ultrasound confirmed a 1.7 cm irregular mass in the right breast lower inner quadrant.  There were no abnormalities sonographically in the right axilla.  Taken together,  the mass plus calcifications measured up to 4 cm.  Accordingly on 01/11/2017 she underwent biopsy of the right breast mass in question, showing [id number]) invasive ductal carcinoma, grade 2, estrogen receptor 90% positive with strong staining intensity, progesterone receptor negative, with an Mib-1 of 20% and no HER-2 amplification, the signals ratio being 1.26 and the number per cell 2.20  The patient's subsequent history is as detailed below.   PAST MEDICAL HISTORY: Past Medical History:  Diagnosis Date  . Acute sinusitis, unspecified   . Anxiety   . Cancer ([place]) 12/2016   right breast cancer  . Depression   . Family history of prostate cancer   . History of radiation therapy 08/31/17- 09/27/17   Right Breast/ 40.05 Gy in 15 fractions, Right Breast Boost/ 10 Gy in 5 fractions.   . Hypertension   . Obesity, unspecified   . Pneumonia, organism unspecified(486)     PAST SURGICAL HISTORY: Past Surgical History:  Procedure Laterality Date  . BREAST LUMPECTOMY WITH RADIOACTIVE SEED AND SENTINEL LYMPH NODE BIOPSY Right 01/25/2017   Procedure: RIGHT BREAST LUMPECTOMY  WITH 2 RADIOACTIVE SEEDS AND SENTINEL LYMPH NODE BIOPSY;  Surgeon: Erroll Luna, MD;  Location: [facility];  Service: General;  Laterality: Right;  . PORTACATH PLACEMENT Right 03/13/2017   Procedure: ULTRASOUND GUIDED INSERTION PORT-A-CATH  RIGHT INTERNAL JUGULAR;  Surgeon: Erroll Luna, MD;  Location: [facility];  Service: General;  Laterality: Right;  . WISDOM TOOTH EXTRACTION      FAMILY HISTORY Family History  Problem Relation Age of  Onset  . Hypertension Mother   . Hypothyroidism Mother   . Hyperlipidemia Mother   . Gout Mother   . Breast cancer Mother 14  . Heart disease Father   . Hypertension Father   . Diabetes Father   . Hypothyroidism Father   . Prostate cancer Father        dx late 49s  . Hypothyroidism Sister   . Cancer Sister        angiosarcoma - mets  . Esophageal cancer Maternal Uncle   . Stroke Paternal Aunt   . Stroke Paternal Uncle   . Heart attack Maternal Grandfather   . ALS Paternal Grandfather   . COPD Neg Hx   . Colon cancer Neg Hx   . Rectal cancer Neg Hx   . Stomach cancer Neg Hx   Her father passed away from a massive stroke at 64. Her mother passed away from complications of breast cancer at 64. She had been diagnosed age 64. The patient's sister had angiosarcoma at 90. There is no otherhistory of breast or ovarian cancer in this famiy. The patient is of Ashkenazi extraction   GYNECOLOGIC HISTORY:  Patient's last menstrual period was 07/12/2011.  Menarche age 4, first live birth age 69, she is GXP2. Menopause 2015, never used OCPs or HR   SOCIAL HISTORY: (updated 01/16/2018) She is a retired Electrical engineer at [employer]. Her husband, [name] was a Curator at [employer] in [place]' Sports coach, but is retired now. Her daughter Shelby Payne teaches nursing at [employer]. Shelby Payne is concerned that on her father's side a cousin has tested positive for a deleterious gene; she will try to bring [name] that information]. Son, Shelby Payne passed away. The patient does not have any grandchildren. She attends [employer].     ADVANCED DIRECTIVES: In the absence of any documentation to the contrary, the patient's spouse is their HCPOA.    HEALTH MAINTENANCE: Social History   Tobacco Use  . Smoking status: Never Smoker  . Smokeless tobacco: Never Used  Vaping Use  . Vaping Use: Never used  Substance Use Topics  . Alcohol use: Yes    Comment: rare glass of wine  . Drug use: No      Colonoscopy: 2013  PAP:  Bone density: Due December 2019 at [place] Reactions  . Hctz [Hydrochlorothiazide] Other (See Comments)    Hyponatremia  . Bactrim [Sulfamethoxazole-Trimethoprim] Rash    Current Outpatient Medications  Medication Sig Dispense Refill  . anastrozole (ARIMIDEX) 1 MG tablet TAKE 1 TABLET(1 MG) BY MOUTH DAILY 90 tablet 4  . b complex vitamins tablet Take 1 tablet by mouth daily.    . Coenzyme Q10 (CO Q-10 PO) Take 1 capsule by mouth daily.     . metoprolol succinate (TOPROL-XL) 100 MG 24 hr tablet TAKE 1 TABLET(100 MG) BY MOUTH DAILY.Please call our office to schedule a follow up to receive additional refills. 30 tablet 0  . Omega-3 Fatty Acids (FISH OIL PO) Take 1 capsule by mouth daily.     . sertraline (ZOLOFT) 100 MG tablet TAKE 1 TABLET(100 MG) BY MOUTH DAILY. Please call our office to schedule a follow up to receive additional refills. 30 tablet 0  . Telmisartan-amLODIPine 40-5 MG TABS  Take 1 tablet by mouth daily. 90 tablet 1  . triamcinolone cream (KENALOG) 0.1 % Apply 1 application 2 (two) times daily topically. (Patient taking differently: Apply 1 application topically 2 (two) times daily as needed (for rash). ) 30 g 6   No current facility-administered medications for this visit.    OBJECTIVE: White woman who appears younger than stated age  45:   04/23/20 1352  BP: 130/70  Pulse: 66  Resp: 18  Temp: 97.9 F (36.6 C)  SpO2: 97%   Wt Readings from Last 3 Encounters:  04/23/20 225 lb 9.6 oz (102.3 kg)  04/23/19 226 lb 8 oz (102.7 kg)  04/22/19 226 lb 4 oz (102.6 kg)   Body mass index is 38.72 kg/m.    ECOG FS:1 - Symptomatic but completely ambulatory  Sclerae unicteric, EOMs intact Wearing a mask No cervical or supraclavicular adenopathy Lungs no rales or rhonchi Heart regular rate and rhythm Abd soft, nontender, positive bowel sounds MSK no focal spinal tenderness, no upper extremity lymphedema Neuro:  nonfocal, well oriented, appropriate affect Breasts: The right breast is status post lumpectomy and radiation.  The cosmetic result is excellent.  There is no evidence of residual recurrent disease.  The left breast is benign.  Both axillae are benign.   LAB RESULTS:  CMP     Component Value Date/Time   NA 133 (L) 04/22/2019 1432   NA 137 01/18/2017 1238   K 4.2 04/22/2019 1432   K 4.1 01/18/2017 1238   CL 98 04/22/2019 1432   CO2 26 04/22/2019 1432   CO2 22 01/18/2017 1238   GLUCOSE 100 (H) 04/22/2019 1432   GLUCOSE 91 01/18/2017 1238   BUN 15 04/22/2019 1432   BUN 19.5 01/18/2017 1238   CREATININE 0.72 04/22/2019 1432   CREATININE 0.88 10/23/2018 1402   CREATININE 0.7 01/18/2017 1238   CALCIUM 10.0 04/22/2019 1432   CALCIUM 9.8 01/18/2017 1238   PROT 7.8 04/22/2019 1432   PROT 7.9 01/18/2017 1238   ALBUMIN 4.3 04/22/2019 1432   ALBUMIN 4.3 01/18/2017 1238   AST 18 04/22/2019 1432   AST 25 10/23/2018 1402   AST 19 01/18/2017 1238   ALT 21 04/22/2019 1432   ALT 36 10/23/2018 1402   ALT 24 01/18/2017 1238   ALKPHOS 71 04/22/2019 1432   ALKPHOS 53 01/18/2017 1238   BILITOT 0.5 04/22/2019 1432   BILITOT 0.4 10/23/2018 1402   BILITOT 0.39 01/18/2017 1238   GFRNONAA >60 10/23/2018 1402   GFRAA >60 10/23/2018 1402    No results found for: TOTALPROTELP, ALBUMINELP, A1GS, A2GS, BETS, BETA2SER, GAMS, MSPIKE, SPEI  No results found for: KPAFRELGTCHN, LAMBDASER, KAPLAMBRATIO  Lab Results  Component Value Date   WBC 8.7 04/23/2020   NEUTROABS 5.7 04/23/2020   HGB 13.1 04/23/2020   HCT 38.9 04/23/2020   MCV 88.8 04/23/2020   PLT 215 04/23/2020   No results found for: LABCA2  No components found for: BLTJQZ009  No results for input(s): INR in the last 168 hours.  No results found for: LABCA2  No results found for: QZR007  No results found for: MAU633  No results found for: HLK562  No results found for: CA2729  No components found for: HGQUANT  No results  found for: CEA1 / No results found for: CEA1   No results found for: AFPTUMOR  No results found for: [place]  No results found for: HGBA, HGBA2QUANT, HGBFQUANT, HGBSQUAN (Hemoglobinopathy evaluation)   No results found for: LDH  No results  found for: IRON, TIBC, IRONPCTSAT (Iron and TIBC)  No results found for: FERRITIN  Urinalysis    Component Value Date/Time   COLORURINE Yellow 12/29/2008 0809   APPEARANCEUR CLEAR 12/29/2008 0809   LABSPEC 1.020 12/29/2008 0809   PHURINE 6.0 12/29/2008 0809   GLUCOSEU NEGATIVE 12/29/2008 0809   BILIRUBINUR NEGATIVE 12/29/2008 0809   KETONESUR NEGATIVE 12/29/2008 0809   UROBILINOGEN 0.2 12/29/2008 0809   NITRITE NEGATIVE 12/29/2008 0809   LEUKOCYTESUR NEGATIVE 12/29/2008 0809    STUDIES: No results found.   ELIGIBLE FOR AVAILABLE RESEARCH PROTOCOL: UPBEAT, ASA: Referral placed 02/28/2017   ASSESSMENT: 65 y.o. [place] woman s/p right breast lower inner quadrant biopsy 01/11/2017 for a cT1c cN0, stage IA invasive ductal breast cancer, estrogen receptor positive, progesterone receptor negativem HER-2 not amplified, with an Mib-1 of 20%  (1) genetics testing 03/22/2017 through the Hereditary Gene Panel offered by [facility] found no deleterious mutations in APC, ATM, AXIN2, BARD1, BMPR1A, BRCA1, BRCA2, BRIP1, CDH1, CDK4, CDKN2A (p14ARF), CDKN2A (p16INK4a), CHEK2, CTNNA1, DICER1, EPCAM (Deletion/duplication testing only), GREM1 (promoter region deletion/duplication testing only), KIT, MEN1, MLH1, MSH2, MSH3, MSH6, MUTYH, NBN, NF1, NHTL1, PALB2, PDGFRA, PMS2, POLD1, POLE, PTEN, RAD50, RAD51C, RAD51D, SDHB, SDHC, SDHD, SMAD4, SMARCA4. STK11, TP53, TSC1, TSC2, and VHL.  The following genes were evaluated for sequence changes only: SDHA and HOXB13 c.251G>A variant only.    (2) status post right lumpectomy and sentinel lymph node sampling 01/25/2017 for a pT1c pN0, stage Ia invasive ductal carcinoma, grade 2, with negative margins.  (3)  Oncotype DX score of 42 predicts a 9-year risk of recurrence outside the breast of 30% if the patient's only systemic therapy is tamoxifen for 5 years.  It also predicts a significant chemotherapy benefit.  (4) adjuvant chemotherapy consisting of cyclophosphamide and doxorubicin in dose dense fashion x4 started 03/14/2017, completed 05/09/2017, followed by weekly paclitaxel x12 started 05/23/2017, completed 08/08/2017  (a) cycle 3 doxorubicin /cyclophosphamide delayed 1 week because of poor tolerance  (5) adjuvant radiation  08/31/2017 - 09/27/2017 Site/dose:    1. Right Breast / 40.05 Gy in 15 fractions 2. Right Breast Boost / 10 Gy in 5 fractions  (6) anastrozole started 10/12/2017  (a) bone density at [facility] 01/10/2018 was normal, T score - 0.9   PLAN: [place] is a little over 3 years out from definitive surgery for her breast cancer with no evidence of disease recurrence.  This is very favorable.  She is tolerating anastrozole well and the plan is to continue that a minimum of 5 years.  She had a bone density a little over 2 years ago which was normal.  I will see if we can get that repeated with her upcoming mammography.  [place] is now living in [place].  She is willing to come here for follow-up since it is just once a year but it really would be easier for her to be followed up in [place].  I will see if Dr. Ilsa Iha who specializes in breast cancer at [facility] will be able to take her on.  If so she would need mammography and a visit in March of next year  Accordingly we are not making further appointments for [place] here.  She knows to call us though if we can be of any further help to her in the future  Total encounter time 25 minutes.*  Jimi Schappert, Virgie Dad, MD  04/23/20 2:18 PM Medical Oncology and Hematology [facility] [place], [place] [address] Tel.  [phone]    Fax. [phone]   I, Wilburn Mylar, am acting as scribe for Dr. Virgie Dad. Astra Gregg.  I, Lurline Del MD, have reviewed the above documentation for accuracy and completeness, and I agree with the above.   *Total Encounter Time as defined by the Centers for Medicare and Medicaid Services includes, in addition to the face-to-face time of a patient visit (documented in the note above) non-face-to-face time: obtaining and reviewing outside history, ordering and reviewing medications, tests or procedures, care coordination (communications with other health care professionals or caregivers) and documentation in the medical record.

## 2020-04-23 ENCOUNTER — Encounter: Payer: Self-pay | Admitting: Oncology

## 2020-04-23 ENCOUNTER — Other Ambulatory Visit: Payer: Self-pay

## 2020-04-23 ENCOUNTER — Inpatient Hospital Stay: Payer: BC Managed Care – PPO | Attending: Oncology | Admitting: Oncology

## 2020-04-23 ENCOUNTER — Inpatient Hospital Stay: Payer: BC Managed Care – PPO

## 2020-04-23 ENCOUNTER — Other Ambulatory Visit: Payer: Self-pay | Admitting: *Deleted

## 2020-04-23 VITALS — BP 130/70 | HR 66 | Temp 97.9°F | Resp 18 | Ht 64.0 in | Wt 225.6 lb

## 2020-04-23 DIAGNOSIS — Z79811 Long term (current) use of aromatase inhibitors: Secondary | ICD-10-CM | POA: Insufficient documentation

## 2020-04-23 DIAGNOSIS — Z923 Personal history of irradiation: Secondary | ICD-10-CM | POA: Diagnosis not present

## 2020-04-23 DIAGNOSIS — C50311 Malignant neoplasm of lower-inner quadrant of right female breast: Secondary | ICD-10-CM | POA: Diagnosis present

## 2020-04-23 DIAGNOSIS — Z9221 Personal history of antineoplastic chemotherapy: Secondary | ICD-10-CM | POA: Diagnosis not present

## 2020-04-23 DIAGNOSIS — Z170 Estrogen receptor positive status [ER+]: Secondary | ICD-10-CM

## 2020-04-23 LAB — CBC WITH DIFFERENTIAL (CANCER CENTER ONLY)
Abs Immature Granulocytes: 0.04 10*3/uL (ref 0.00–0.07)
Basophils Absolute: 0.1 10*3/uL (ref 0.0–0.1)
Basophils Relative: 1 %
Eosinophils Absolute: 0.3 10*3/uL (ref 0.0–0.5)
Eosinophils Relative: 3 %
HCT: 38.9 % (ref 36.0–46.0)
Hemoglobin: 13.1 g/dL (ref 12.0–15.0)
Immature Granulocytes: 1 %
Lymphocytes Relative: 23 %
Lymphs Abs: 2 10*3/uL (ref 0.7–4.0)
MCH: 29.9 pg (ref 26.0–34.0)
MCHC: 33.7 g/dL (ref 30.0–36.0)
MCV: 88.8 fL (ref 80.0–100.0)
Monocytes Absolute: 0.7 10*3/uL (ref 0.1–1.0)
Monocytes Relative: 8 %
Neutro Abs: 5.7 10*3/uL (ref 1.7–7.7)
Neutrophils Relative %: 64 %
Platelet Count: 215 10*3/uL (ref 150–400)
RBC: 4.38 MIL/uL (ref 3.87–5.11)
RDW: 13.1 % (ref 11.5–15.5)
WBC Count: 8.7 10*3/uL (ref 4.0–10.5)
nRBC: 0 % (ref 0.0–0.2)

## 2020-04-23 LAB — CMP (CANCER CENTER ONLY)
ALT: 18 U/L (ref 0–44)
AST: 14 U/L — ABNORMAL LOW (ref 15–41)
Albumin: 4.2 g/dL (ref 3.5–5.0)
Alkaline Phosphatase: 61 U/L (ref 38–126)
Anion gap: 12 (ref 5–15)
BUN: 15 mg/dL (ref 8–23)
CO2: 26 mmol/L (ref 22–32)
Calcium: 9.8 mg/dL (ref 8.9–10.3)
Chloride: 101 mmol/L (ref 98–111)
Creatinine: 0.81 mg/dL (ref 0.44–1.00)
GFR, Estimated: >60 mL/min (ref >60)
Glucose, Bld: 107 mg/dL — ABNORMAL HIGH (ref 70–99)
Potassium: 4.3 mmol/L (ref 3.5–5.1)
Sodium: 139 mmol/L (ref 135–145)
Total Bilirubin: 0.5 mg/dL (ref 0.3–1.2)
Total Protein: 7.9 g/dL (ref 6.5–8.1)

## 2020-04-23 MED ORDER — ANASTROZOLE 1 MG PO TABS: ORAL_TABLET | ORAL | 4 refills | Status: AC

## 2020-05-01 ENCOUNTER — Other Ambulatory Visit: Payer: Self-pay

## 2020-05-01 ENCOUNTER — Ambulatory Visit (INDEPENDENT_AMBULATORY_CARE_PROVIDER_SITE_OTHER): Payer: BC Managed Care – PPO | Admitting: Internal Medicine

## 2020-05-01 ENCOUNTER — Encounter: Payer: Self-pay | Admitting: Internal Medicine

## 2020-05-01 VITALS — BP 126/64 | HR 72 | Temp 98.2°F | Resp 18 | Ht 64.0 in | Wt 226.0 lb

## 2020-05-01 DIAGNOSIS — C50311 Malignant neoplasm of lower-inner quadrant of right female breast: Secondary | ICD-10-CM | POA: Diagnosis not present

## 2020-05-01 DIAGNOSIS — R739 Hyperglycemia, unspecified: Secondary | ICD-10-CM | POA: Diagnosis not present

## 2020-05-01 DIAGNOSIS — Z0000 Encounter for general adult medical examination without abnormal findings: Secondary | ICD-10-CM

## 2020-05-01 DIAGNOSIS — Z170 Estrogen receptor positive status [ER+]: Secondary | ICD-10-CM

## 2020-05-01 DIAGNOSIS — E785 Hyperlipidemia, unspecified: Secondary | ICD-10-CM

## 2020-05-01 DIAGNOSIS — R1011 Right upper quadrant pain: Secondary | ICD-10-CM

## 2020-05-01 LAB — LIPID PANEL
Cholesterol: 204 mg/dL — ABNORMAL HIGH (ref 0–200)
HDL: 45.4 mg/dL (ref >39.00)
LDL Cholesterol: 126 mg/dL — ABNORMAL HIGH (ref 0–99)
NonHDL: 158.24
Total CHOL/HDL Ratio: 4
Triglycerides: 162 mg/dL — ABNORMAL HIGH (ref 0.0–149.0)
VLDL: 32.4 mg/dL (ref 0.0–40.0)

## 2020-05-01 LAB — HEMOGLOBIN A1C: Hgb A1c MFr Bld: 5.5 % (ref 4.6–6.5)

## 2020-05-01 MED ORDER — METOPROLOL SUCCINATE ER 100 MG PO TB24: ORAL_TABLET | ORAL | 3 refills | Status: AC

## 2020-05-01 MED ORDER — SERTRALINE HCL 100 MG PO TABS: ORAL_TABLET | ORAL | 3 refills | Status: AC

## 2020-05-01 MED ORDER — BETAMETHASONE VALERATE 0.1 % EX OINT: 1.0000 "application " | TOPICAL_OINTMENT | Freq: Two times a day (BID) | CUTANEOUS | 0 refills | Status: AC

## 2020-05-01 NOTE — Patient Instructions (Addendum)
We will check the blood work today and the ultrasound of the stomach.  Think about getting the shingles vaccine either here or at the pharmacy. 1st shot, then 2-6 months later get the 2nd shot.   Health Maintenance, Female Adopting a healthy lifestyle and getting preventive care are important in promoting health and wellness. Ask your health care provider about:  The right schedule for you to have regular tests and exams.  Things you can do on your own to prevent diseases and keep yourself healthy. What should I know about diet, weight, and exercise? Eat a healthy diet  Eat a diet that includes plenty of vegetables, fruits, low-fat dairy products, and lean protein.  Do not eat a lot of foods that are high in solid fats, added sugars, or sodium.   Maintain a healthy weight Body mass index (BMI) is used to identify weight problems. It estimates body fat based on height and weight. Your health care provider can help determine your BMI and help you achieve or maintain a healthy weight. Get regular exercise Get regular exercise. This is one of the most important things you can do for your health. Most adults should:  Exercise for at least 150 minutes each week. The exercise should increase your heart rate and make you sweat (moderate-intensity exercise).  Do strengthening exercises at least twice a week. This is in addition to the moderate-intensity exercise.  Spend less time sitting. Even light physical activity can be beneficial. Watch cholesterol and blood lipids Have your blood tested for lipids and cholesterol at 64 years of age, then have this test every 5 years. Have your cholesterol levels checked more often if:  Your lipid or cholesterol levels are high.  You are older than 64 years of age.  You are at high risk for heart disease. What should I know about cancer screening? Depending on your health history and family history, you may need to have cancer screening at various  ages. This may include screening for:  Breast cancer.  Cervical cancer.  Colorectal cancer.  Skin cancer.  Lung cancer. What should I know about heart disease, diabetes, and high blood pressure? Blood pressure and heart disease  High blood pressure causes heart disease and increases the risk of stroke. This is more likely to develop in people who have high blood pressure readings, are of African descent, or are overweight.  Have your blood pressure checked: ? Every 3-5 years if you are 25-96 years of age. ? Every year if you are 62 years old or older. Diabetes Have regular diabetes screenings. This checks your fasting blood sugar level. Have the screening done:  Once every three years after age 50 if you are at a normal weight and have a low risk for diabetes.  More often and at a younger age if you are overweight or have a high risk for diabetes. What should I know about preventing infection? Hepatitis B If you have a higher risk for hepatitis B, you should be screened for this virus. Talk with your health care provider to find out if you are at risk for hepatitis B infection. Hepatitis C Testing is recommended for:  Everyone born from [birth year] through 1964.  Anyone with known risk factors for hepatitis C. Sexually transmitted infections (STIs)  Get screened for STIs, including gonorrhea and chlamydia, if: ? You are sexually active and are younger than 64 years of age. ? You are older than 64 years of age and your health care provider  tells you that you are at risk for this type of infection. ? Your sexual activity has changed since you were last screened, and you are at increased risk for chlamydia or gonorrhea. Ask your health care provider if you are at risk.  Ask your health care provider about whether you are at high risk for HIV. Your health care provider may recommend a prescription medicine to help prevent HIV infection. If you choose to take medicine to prevent HIV, you  should first get tested for HIV. You should then be tested every 3 months for as long as you are taking the medicine. Pregnancy  If you are about to stop having your period (premenopausal) and you may become pregnant, seek counseling before you get pregnant.  Take 400 to 800 micrograms (mcg) of folic acid every day if you become pregnant.  Ask for birth control (contraception) if you want to prevent pregnancy. Osteoporosis and menopause Osteoporosis is a disease in which the bones lose minerals and strength with aging. This can result in bone fractures. If you are 17 years old or older, or if you are at risk for osteoporosis and fractures, ask your health care provider if you should:  Be screened for bone loss.  Take a calcium or vitamin D supplement to lower your risk of fractures.  Be given hormone replacement therapy (HRT) to treat symptoms of menopause. Follow these instructions at home: Lifestyle  Do not use any products that contain nicotine or tobacco, such as cigarettes, e-cigarettes, and chewing tobacco. If you need help quitting, ask your health care provider.  Do not use street drugs.  Do not share needles.  Ask your health care provider for help if you need support or information about quitting drugs. Alcohol use  Do not drink alcohol if: ? Your health care provider tells you not to drink. ? You are pregnant, may be pregnant, or are planning to become pregnant.  If you drink alcohol: ? Limit how much you use to 0-1 drink a day. ? Limit intake if you are breastfeeding.  Be aware of how much alcohol is in your drink. In the [place] one drink equals one 12 oz bottle of beer (355 mL), one 5 oz glass of wine (148 mL), or one 1 oz glass of hard liquor (44 mL). General instructions  Schedule regular health, dental, and eye exams.  Stay current with your vaccines.  Tell your health care provider if: ? You often feel depressed. ? You have ever been abused or do not feel  safe at home. Summary  Adopting a healthy lifestyle and getting preventive care are important in promoting health and wellness.  Follow your health care provider's instructions about healthy diet, exercising, and getting tested or screened for diseases.  Follow your health care provider's instructions on monitoring your cholesterol and blood pressure. This information is not intended to replace advice given to you by your health care provider. Make sure you discuss any questions you have with your health care provider. Document Revised: 01/17/2018 Document Reviewed: 01/17/2018 Elsevier Patient Education  2021 [employer].

## 2020-05-01 NOTE — Assessment & Plan Note (Signed)
Checking HgA1c. 

## 2020-05-01 NOTE — Assessment & Plan Note (Signed)
Ordered US RUQ.

## 2020-05-01 NOTE — Assessment & Plan Note (Signed)
On arimidex and seeing oncology still for follow up.

## 2020-05-01 NOTE — Assessment & Plan Note (Signed)
Checking lipid panel and on fish oil otc. Adjust as needed.

## 2020-05-01 NOTE — Progress Notes (Signed)
   Subjective:   Patient ID: Shelby Payne, female    DOB: [birth year], 65 y.o.   MRN: [id number]  HPI The patient is a 65 YO female coming in for physical.   HPI #2: Here for new RUQ pain. Started months ago, originally thought was pulled muscle but has not resolved. Denies worsening with food or eating. She does have some radiation to the upper right back. Denies weight change. Denies N/V or constipation/diarrhea.   PMH, [facility], social history reviewed and updated  Review of Systems  Constitutional: Negative.   HENT: Negative.   Eyes: Negative.   Respiratory: Negative for cough, chest tightness and shortness of breath.   Cardiovascular: Negative for chest pain, palpitations and leg swelling.  Gastrointestinal: Positive for abdominal pain. Negative for abdominal distention, anal bleeding, blood in stool, constipation, diarrhea, nausea and vomiting.  Musculoskeletal: Negative.   Skin: Negative.   Neurological: Negative.   Psychiatric/Behavioral: Negative.     Objective:  Physical Exam Constitutional:      Appearance: She is well-developed. She is obese.  HENT:     Head: Normocephalic and atraumatic.  Cardiovascular:     Rate and Rhythm: Normal rate and regular rhythm.  Pulmonary:     Effort: Pulmonary effort is normal. No respiratory distress.     Breath sounds: Normal breath sounds. No wheezing or rales.  Abdominal:     General: Bowel sounds are normal. There is no distension.     Palpations: Abdomen is soft.     Tenderness: There is abdominal tenderness. There is no rebound.     Comments: Minimal tenderness RUQ  Musculoskeletal:     Cervical back: Normal range of motion.  Skin:    General: Skin is warm and dry.  Neurological:     Mental Status: She is alert and oriented to person, place, and time.     Coordination: Coordination normal.     Vitals:   05/01/20 1400  BP: 126/64  Pulse: 72  Resp: 18  Temp: 98.2 F (36.8 C)  TempSrc: Oral  SpO2: 98%  Weight:  226 lb (102.5 kg)  Height: 5\' 4"  (1.626 m)    This visit occurred during the SARS-CoV-2 public health emergency.  Safety protocols were in place, including screening questions prior to the visit, additional usage of staff PPE, and extensive cleaning of exam room while observing appropriate contact time as indicated for disinfecting solutions.   Assessment & Plan:

## 2020-05-01 NOTE — Assessment & Plan Note (Signed)
Flu shot counseled. Covid-19 up to date. Shingrix counseled declines today. Tetanus up to date. Colonoscopy up to date. Mammogram up to date, pap smear up to date. Counseled about sun safety and mole surveillance. Counseled about the dangers of distracted driving. Given 10 year screening recommendations.

## 2020-05-05 ENCOUNTER — Encounter: Payer: Self-pay | Admitting: Oncology

## 2020-05-12 ENCOUNTER — Ambulatory Visit
Admission: RE | Admit: 2020-05-12 | Discharge: 2020-05-12 | Disposition: A | Discharge status: Home / Self Care | Payer: BC Managed Care – PPO | Source: Ambulatory Visit | Attending: Internal Medicine | Admitting: Internal Medicine

## 2020-05-12 DIAGNOSIS — R1011 Right upper quadrant pain: Secondary | ICD-10-CM

## 2020-07-17 ENCOUNTER — Other Ambulatory Visit: Payer: Self-pay | Admitting: Internal Medicine

## 2020-07-17 DIAGNOSIS — I10 Essential (primary) hypertension: Secondary | ICD-10-CM

## 2021-10-25 ENCOUNTER — Encounter: Payer: Self-pay | Admitting: Internal Medicine

## 2022-11-07 ENCOUNTER — Encounter (HOSPITAL_COMMUNITY): Payer: Self-pay

## 2023-02-06 ENCOUNTER — Encounter: Payer: Self-pay | Admitting: Oncology
